# Patient Record
Sex: Female | Born: 1945 | Race: Black or African American | Hispanic: No | Marital: Single | Smoking: Never smoker
Health system: Southern US, Community
[De-identification: ages and names within clinical notes are randomized; demographics above are authoritative.]

## PROBLEM LIST (undated history)

## (undated) DIAGNOSIS — E785 Hyperlipidemia, unspecified: Secondary | ICD-10-CM

## (undated) DIAGNOSIS — I10 Essential (primary) hypertension: Secondary | ICD-10-CM

## (undated) DIAGNOSIS — K219 Gastro-esophageal reflux disease without esophagitis: Secondary | ICD-10-CM

## (undated) HISTORY — DX: Hyperlipidemia, unspecified: E78.5

## (undated) HISTORY — DX: Gastro-esophageal reflux disease without esophagitis: K21.9

## (undated) HISTORY — DX: Essential (primary) hypertension: I10

---

## 1998-06-01 ENCOUNTER — Other Ambulatory Visit: Admission: RE | Admit: 1998-06-01 | Discharge: 1998-06-01 | Payer: Self-pay | Admitting: Family Medicine

## 1999-04-27 ENCOUNTER — Emergency Department (HOSPITAL_COMMUNITY): Admission: EM | Admit: 1999-04-27 | Discharge: 1999-04-27 | Payer: Self-pay | Admitting: Internal Medicine

## 1999-10-25 ENCOUNTER — Emergency Department (HOSPITAL_COMMUNITY): Admission: EM | Admit: 1999-10-25 | Discharge: 1999-10-25 | Payer: Self-pay | Admitting: Emergency Medicine

## 1999-12-14 ENCOUNTER — Encounter: Payer: Self-pay | Admitting: Emergency Medicine

## 1999-12-14 ENCOUNTER — Emergency Department (HOSPITAL_COMMUNITY): Admission: EM | Admit: 1999-12-14 | Discharge: 1999-12-14 | Payer: Self-pay | Admitting: Emergency Medicine

## 2000-02-02 ENCOUNTER — Emergency Department (HOSPITAL_COMMUNITY): Admission: EM | Admit: 2000-02-02 | Discharge: 2000-02-02 | Payer: Self-pay | Admitting: Emergency Medicine

## 2000-06-11 ENCOUNTER — Emergency Department (HOSPITAL_COMMUNITY): Admission: EM | Admit: 2000-06-11 | Discharge: 2000-06-11 | Payer: Self-pay

## 2000-12-15 ENCOUNTER — Encounter: Payer: Self-pay | Admitting: Emergency Medicine

## 2000-12-15 ENCOUNTER — Emergency Department (HOSPITAL_COMMUNITY): Admission: EM | Admit: 2000-12-15 | Discharge: 2000-12-15 | Payer: Self-pay | Admitting: Emergency Medicine

## 2000-12-22 ENCOUNTER — Emergency Department (HOSPITAL_COMMUNITY): Admission: EM | Admit: 2000-12-22 | Discharge: 2000-12-22 | Payer: Self-pay | Admitting: Emergency Medicine

## 2000-12-22 ENCOUNTER — Encounter: Payer: Self-pay | Admitting: Emergency Medicine

## 2000-12-26 ENCOUNTER — Ambulatory Visit (HOSPITAL_COMMUNITY): Admission: RE | Admit: 2000-12-26 | Discharge: 2000-12-26 | Payer: Self-pay

## 2000-12-26 ENCOUNTER — Encounter: Admission: RE | Admit: 2000-12-26 | Discharge: 2000-12-26 | Payer: Self-pay

## 2000-12-31 ENCOUNTER — Encounter: Admission: RE | Admit: 2000-12-31 | Discharge: 2000-12-31 | Payer: Self-pay

## 2001-03-10 ENCOUNTER — Emergency Department (HOSPITAL_COMMUNITY): Admission: EM | Admit: 2001-03-10 | Discharge: 2001-03-10 | Payer: Self-pay | Admitting: Emergency Medicine

## 2001-04-08 ENCOUNTER — Encounter: Admission: RE | Admit: 2001-04-08 | Discharge: 2001-04-08 | Payer: Self-pay | Admitting: Internal Medicine

## 2001-05-24 ENCOUNTER — Emergency Department (HOSPITAL_COMMUNITY): Admission: EM | Admit: 2001-05-24 | Discharge: 2001-05-24 | Payer: Self-pay | Admitting: Emergency Medicine

## 2001-06-16 ENCOUNTER — Emergency Department (HOSPITAL_COMMUNITY): Admission: EM | Admit: 2001-06-16 | Discharge: 2001-06-16 | Payer: Self-pay | Admitting: Emergency Medicine

## 2002-05-01 ENCOUNTER — Emergency Department (HOSPITAL_COMMUNITY): Admission: EM | Admit: 2002-05-01 | Discharge: 2002-05-01 | Payer: Self-pay | Admitting: Emergency Medicine

## 2002-05-01 ENCOUNTER — Encounter: Payer: Self-pay | Admitting: Emergency Medicine

## 2002-05-15 ENCOUNTER — Ambulatory Visit (HOSPITAL_COMMUNITY): Admission: RE | Admit: 2002-05-15 | Discharge: 2002-05-15 | Payer: Self-pay | Admitting: *Deleted

## 2002-05-15 ENCOUNTER — Encounter: Payer: Self-pay | Admitting: *Deleted

## 2002-08-15 ENCOUNTER — Encounter: Admission: RE | Admit: 2002-08-15 | Discharge: 2002-08-15 | Payer: Self-pay | Admitting: Internal Medicine

## 2002-08-26 ENCOUNTER — Encounter: Admission: RE | Admit: 2002-08-26 | Discharge: 2002-08-26 | Payer: Self-pay | Admitting: *Deleted

## 2002-11-30 ENCOUNTER — Emergency Department (HOSPITAL_COMMUNITY): Admission: EM | Admit: 2002-11-30 | Discharge: 2002-11-30 | Payer: Self-pay | Admitting: Emergency Medicine

## 2002-12-03 ENCOUNTER — Encounter: Admission: RE | Admit: 2002-12-03 | Discharge: 2002-12-03 | Payer: Self-pay | Admitting: Internal Medicine

## 2002-12-10 ENCOUNTER — Encounter: Admission: RE | Admit: 2002-12-10 | Discharge: 2002-12-10 | Payer: Self-pay | Admitting: Internal Medicine

## 2002-12-22 ENCOUNTER — Encounter: Admission: RE | Admit: 2002-12-22 | Discharge: 2002-12-22 | Payer: Self-pay | Admitting: Internal Medicine

## 2002-12-23 ENCOUNTER — Observation Stay (HOSPITAL_COMMUNITY): Admission: EM | Admit: 2002-12-23 | Discharge: 2002-12-24 | Payer: Self-pay | Admitting: Emergency Medicine

## 2002-12-23 ENCOUNTER — Encounter: Payer: Self-pay | Admitting: Internal Medicine

## 2002-12-28 ENCOUNTER — Emergency Department (HOSPITAL_COMMUNITY): Admission: EM | Admit: 2002-12-28 | Discharge: 2002-12-28 | Payer: Self-pay | Admitting: Emergency Medicine

## 2003-01-02 ENCOUNTER — Encounter: Admission: RE | Admit: 2003-01-02 | Discharge: 2003-01-02 | Payer: Self-pay | Admitting: Internal Medicine

## 2003-01-05 ENCOUNTER — Encounter: Admission: RE | Admit: 2003-01-05 | Discharge: 2003-01-05 | Payer: Self-pay | Admitting: Internal Medicine

## 2003-01-09 ENCOUNTER — Encounter: Payer: Self-pay | Admitting: Cardiology

## 2003-01-09 ENCOUNTER — Ambulatory Visit (HOSPITAL_COMMUNITY): Admission: RE | Admit: 2003-01-09 | Discharge: 2003-01-09 | Payer: Self-pay | Admitting: Internal Medicine

## 2003-01-16 ENCOUNTER — Emergency Department (HOSPITAL_COMMUNITY): Admission: EM | Admit: 2003-01-16 | Discharge: 2003-01-17 | Payer: Self-pay | Admitting: Emergency Medicine

## 2003-01-16 ENCOUNTER — Encounter: Payer: Self-pay | Admitting: Emergency Medicine

## 2003-01-19 ENCOUNTER — Encounter: Admission: RE | Admit: 2003-01-19 | Discharge: 2003-01-19 | Payer: Self-pay | Admitting: Internal Medicine

## 2003-01-19 ENCOUNTER — Emergency Department (HOSPITAL_COMMUNITY): Admission: EM | Admit: 2003-01-19 | Discharge: 2003-01-20 | Payer: Self-pay | Admitting: Emergency Medicine

## 2003-01-23 ENCOUNTER — Emergency Department (HOSPITAL_COMMUNITY): Admission: EM | Admit: 2003-01-23 | Discharge: 2003-01-24 | Payer: Self-pay | Admitting: Emergency Medicine

## 2003-01-28 ENCOUNTER — Encounter: Admission: RE | Admit: 2003-01-28 | Discharge: 2003-01-28 | Payer: Self-pay | Admitting: Internal Medicine

## 2003-02-16 ENCOUNTER — Encounter: Admission: RE | Admit: 2003-02-16 | Discharge: 2003-02-16 | Payer: Self-pay | Admitting: Internal Medicine

## 2003-02-18 ENCOUNTER — Encounter: Admission: RE | Admit: 2003-02-18 | Discharge: 2003-05-19 | Payer: Self-pay | Admitting: *Deleted

## 2003-03-16 ENCOUNTER — Encounter: Payer: Self-pay | Admitting: Emergency Medicine

## 2003-03-16 ENCOUNTER — Emergency Department (HOSPITAL_COMMUNITY): Admission: AD | Admit: 2003-03-16 | Discharge: 2003-03-16 | Payer: Self-pay | Admitting: Emergency Medicine

## 2003-03-23 ENCOUNTER — Encounter: Admission: RE | Admit: 2003-03-23 | Discharge: 2003-03-23 | Payer: Self-pay | Admitting: Internal Medicine

## 2003-05-21 ENCOUNTER — Encounter: Admission: RE | Admit: 2003-05-21 | Discharge: 2003-05-21 | Payer: Self-pay | Admitting: Internal Medicine

## 2003-05-29 ENCOUNTER — Emergency Department (HOSPITAL_COMMUNITY): Admission: EM | Admit: 2003-05-29 | Discharge: 2003-05-30 | Payer: Self-pay | Admitting: Emergency Medicine

## 2003-05-29 ENCOUNTER — Encounter: Payer: Self-pay | Admitting: Emergency Medicine

## 2003-06-23 ENCOUNTER — Encounter: Admission: RE | Admit: 2003-06-23 | Discharge: 2003-06-23 | Payer: Self-pay | Admitting: Internal Medicine

## 2003-08-20 ENCOUNTER — Emergency Department (HOSPITAL_COMMUNITY): Admission: EM | Admit: 2003-08-20 | Discharge: 2003-08-20 | Payer: Self-pay

## 2003-10-06 ENCOUNTER — Encounter: Admission: RE | Admit: 2003-10-06 | Discharge: 2003-10-06 | Payer: Self-pay | Admitting: Internal Medicine

## 2003-10-06 ENCOUNTER — Ambulatory Visit (HOSPITAL_COMMUNITY): Admission: RE | Admit: 2003-10-06 | Discharge: 2003-10-06 | Payer: Self-pay | Admitting: Internal Medicine

## 2004-01-01 ENCOUNTER — Emergency Department (HOSPITAL_COMMUNITY): Admission: EM | Admit: 2004-01-01 | Discharge: 2004-01-01 | Payer: Self-pay | Admitting: Family Medicine

## 2004-02-23 ENCOUNTER — Encounter: Admission: RE | Admit: 2004-02-23 | Discharge: 2004-02-23 | Payer: Self-pay | Admitting: Internal Medicine

## 2004-04-04 ENCOUNTER — Encounter: Admission: RE | Admit: 2004-04-04 | Discharge: 2004-04-04 | Payer: Self-pay | Admitting: Internal Medicine

## 2004-05-29 ENCOUNTER — Emergency Department (HOSPITAL_COMMUNITY): Admission: EM | Admit: 2004-05-29 | Discharge: 2004-05-29 | Payer: Self-pay | Admitting: Family Medicine

## 2004-05-31 ENCOUNTER — Ambulatory Visit (HOSPITAL_COMMUNITY): Admission: RE | Admit: 2004-05-31 | Discharge: 2004-05-31 | Payer: Self-pay | Admitting: Gastroenterology

## 2004-05-31 ENCOUNTER — Encounter (INDEPENDENT_AMBULATORY_CARE_PROVIDER_SITE_OTHER): Payer: Self-pay | Admitting: *Deleted

## 2004-07-24 ENCOUNTER — Emergency Department (HOSPITAL_COMMUNITY): Admission: EM | Admit: 2004-07-24 | Discharge: 2004-07-25 | Payer: Self-pay | Admitting: Emergency Medicine

## 2004-07-24 ENCOUNTER — Emergency Department (HOSPITAL_COMMUNITY): Admission: EM | Admit: 2004-07-24 | Discharge: 2004-07-24 | Payer: Self-pay | Admitting: Emergency Medicine

## 2004-08-04 ENCOUNTER — Ambulatory Visit: Payer: Self-pay | Admitting: Internal Medicine

## 2004-08-17 ENCOUNTER — Ambulatory Visit: Payer: Self-pay | Admitting: Internal Medicine

## 2004-08-31 ENCOUNTER — Ambulatory Visit: Payer: Self-pay | Admitting: Internal Medicine

## 2004-09-01 ENCOUNTER — Ambulatory Visit: Payer: Self-pay | Admitting: Internal Medicine

## 2004-12-07 ENCOUNTER — Emergency Department (HOSPITAL_COMMUNITY): Admission: EM | Admit: 2004-12-07 | Discharge: 2004-12-07 | Payer: Self-pay | Admitting: Emergency Medicine

## 2005-04-12 ENCOUNTER — Ambulatory Visit: Payer: Self-pay | Admitting: Internal Medicine

## 2005-06-19 ENCOUNTER — Emergency Department (HOSPITAL_COMMUNITY): Admission: EM | Admit: 2005-06-19 | Discharge: 2005-06-20 | Payer: Self-pay | Admitting: Emergency Medicine

## 2005-11-07 ENCOUNTER — Emergency Department (HOSPITAL_COMMUNITY): Admission: EM | Admit: 2005-11-07 | Discharge: 2005-11-07 | Payer: Self-pay | Admitting: Family Medicine

## 2006-03-15 ENCOUNTER — Ambulatory Visit: Payer: Self-pay | Admitting: Internal Medicine

## 2006-05-08 ENCOUNTER — Encounter (INDEPENDENT_AMBULATORY_CARE_PROVIDER_SITE_OTHER): Payer: Self-pay | Admitting: *Deleted

## 2006-05-08 ENCOUNTER — Ambulatory Visit (HOSPITAL_BASED_OUTPATIENT_CLINIC_OR_DEPARTMENT_OTHER): Admission: RE | Admit: 2006-05-08 | Discharge: 2006-05-08 | Payer: Self-pay | Admitting: Orthopedic Surgery

## 2006-07-19 ENCOUNTER — Emergency Department (HOSPITAL_COMMUNITY): Admission: EM | Admit: 2006-07-19 | Discharge: 2006-07-19 | Payer: Self-pay | Admitting: Emergency Medicine

## 2006-08-14 ENCOUNTER — Encounter (INDEPENDENT_AMBULATORY_CARE_PROVIDER_SITE_OTHER): Payer: Self-pay | Admitting: Pulmonary Disease

## 2006-08-14 ENCOUNTER — Ambulatory Visit: Payer: Self-pay | Admitting: Internal Medicine

## 2006-08-14 LAB — CONVERTED CEMR LAB: Pap Smear: NORMAL

## 2006-08-15 ENCOUNTER — Encounter (INDEPENDENT_AMBULATORY_CARE_PROVIDER_SITE_OTHER): Payer: Self-pay | Admitting: Pulmonary Disease

## 2006-08-15 DIAGNOSIS — I209 Angina pectoris, unspecified: Secondary | ICD-10-CM | POA: Insufficient documentation

## 2006-08-15 DIAGNOSIS — K219 Gastro-esophageal reflux disease without esophagitis: Secondary | ICD-10-CM | POA: Insufficient documentation

## 2006-08-15 DIAGNOSIS — K649 Unspecified hemorrhoids: Secondary | ICD-10-CM | POA: Insufficient documentation

## 2006-08-27 ENCOUNTER — Emergency Department (HOSPITAL_COMMUNITY): Admission: EM | Admit: 2006-08-27 | Discharge: 2006-08-27 | Payer: Self-pay | Admitting: Family Medicine

## 2006-09-11 ENCOUNTER — Ambulatory Visit: Payer: Self-pay | Admitting: Internal Medicine

## 2006-09-12 ENCOUNTER — Ambulatory Visit: Payer: Self-pay | Admitting: Internal Medicine

## 2006-09-12 ENCOUNTER — Encounter (INDEPENDENT_AMBULATORY_CARE_PROVIDER_SITE_OTHER): Payer: Self-pay | Admitting: Pulmonary Disease

## 2006-09-12 LAB — CONVERTED CEMR LAB
Bilirubin Urine: NEGATIVE
HCT: 36.5 % (ref 36.0–46.0)
Hemoglobin: 12.5 g/dL (ref 12.0–15.0)
Hgb urine dipstick: NEGATIVE
Ketones, ur: NEGATIVE mg/dL
Leukocyte count, blood: 5.5 10*9/L (ref 4.0–10.5)
MCHC: 34.1 g/dL (ref 30.0–36.0)
MCV: 94.4 fL (ref 78.0–100.0)
Nitrite: NEGATIVE
Platelets: 220 10*3/uL (ref 150–400)
Protein, ur: NEGATIVE mg/dL
RBC: 3.87 M/uL (ref 3.87–5.11)
RDW: 13.3 % (ref 11.5–14.0)
Specific Gravity, Urine: 1.027 (ref 1.005–1.03)
Urine Glucose: NEGATIVE mg/dL
Urobilinogen, UA: 0.2 (ref 0.0–1.0)
pH: 6 (ref 5.0–8.0)

## 2006-09-18 ENCOUNTER — Ambulatory Visit: Payer: Self-pay | Admitting: Internal Medicine

## 2006-10-02 ENCOUNTER — Encounter (INDEPENDENT_AMBULATORY_CARE_PROVIDER_SITE_OTHER): Payer: Self-pay | Admitting: Internal Medicine

## 2006-12-21 ENCOUNTER — Encounter (INDEPENDENT_AMBULATORY_CARE_PROVIDER_SITE_OTHER): Payer: Self-pay | Admitting: Pulmonary Disease

## 2007-01-03 DIAGNOSIS — A048 Other specified bacterial intestinal infections: Secondary | ICD-10-CM | POA: Insufficient documentation

## 2007-01-18 ENCOUNTER — Emergency Department (HOSPITAL_COMMUNITY): Admission: EM | Admit: 2007-01-18 | Discharge: 2007-01-18 | Payer: Self-pay | Admitting: Family Medicine

## 2007-06-28 ENCOUNTER — Emergency Department (HOSPITAL_COMMUNITY): Admission: EM | Admit: 2007-06-28 | Discharge: 2007-06-28 | Payer: Self-pay | Admitting: Family Medicine

## 2007-08-09 ENCOUNTER — Emergency Department (HOSPITAL_COMMUNITY): Admission: EM | Admit: 2007-08-09 | Discharge: 2007-08-09 | Payer: Self-pay | Admitting: Family Medicine

## 2007-08-12 ENCOUNTER — Emergency Department (HOSPITAL_COMMUNITY): Admission: EM | Admit: 2007-08-12 | Discharge: 2007-08-12 | Payer: Self-pay | Admitting: Emergency Medicine

## 2007-08-15 ENCOUNTER — Emergency Department (HOSPITAL_COMMUNITY): Admission: EM | Admit: 2007-08-15 | Discharge: 2007-08-15 | Payer: Self-pay | Admitting: Emergency Medicine

## 2007-09-01 ENCOUNTER — Inpatient Hospital Stay (HOSPITAL_COMMUNITY): Admission: EM | Admit: 2007-09-01 | Discharge: 2007-09-04 | Payer: Self-pay | Admitting: Emergency Medicine

## 2007-09-01 ENCOUNTER — Ambulatory Visit: Payer: Self-pay | Admitting: Internal Medicine

## 2007-09-03 ENCOUNTER — Encounter (INDEPENDENT_AMBULATORY_CARE_PROVIDER_SITE_OTHER): Payer: Self-pay | Admitting: Internal Medicine

## 2007-09-03 ENCOUNTER — Ambulatory Visit: Payer: Self-pay | Admitting: Surgery

## 2007-09-06 ENCOUNTER — Encounter (INDEPENDENT_AMBULATORY_CARE_PROVIDER_SITE_OTHER): Payer: Self-pay | Admitting: Internal Medicine

## 2007-09-06 ENCOUNTER — Ambulatory Visit: Payer: Self-pay | Admitting: Infectious Diseases

## 2007-09-06 DIAGNOSIS — E785 Hyperlipidemia, unspecified: Secondary | ICD-10-CM | POA: Insufficient documentation

## 2007-09-06 DIAGNOSIS — E1169 Type 2 diabetes mellitus with other specified complication: Secondary | ICD-10-CM | POA: Insufficient documentation

## 2007-09-06 DIAGNOSIS — I10 Essential (primary) hypertension: Secondary | ICD-10-CM | POA: Insufficient documentation

## 2007-09-06 LAB — CONVERTED CEMR LAB
ALT: 15 units/L (ref 0–35)
AST: 18 units/L (ref 0–37)
Albumin: 4.5 g/dL (ref 3.5–5.2)
Alkaline Phosphatase: 63 units/L (ref 39–117)
BUN: 22 mg/dL (ref 6–23)
CO2: 24 meq/L (ref 19–32)
Glucose, Bld: 175 mg/dL — ABNORMAL HIGH (ref 70–99)
Potassium: 3.6 meq/L (ref 3.5–5.3)
Sodium: 138 meq/L (ref 135–145)
Total Bilirubin: 0.7 mg/dL (ref 0.3–1.2)
Total Protein: 7.6 g/dL (ref 6.0–8.3)

## 2007-10-24 ENCOUNTER — Ambulatory Visit (HOSPITAL_COMMUNITY): Admission: RE | Admit: 2007-10-24 | Discharge: 2007-10-24 | Payer: Self-pay | Admitting: Internal Medicine

## 2007-10-24 ENCOUNTER — Encounter (INDEPENDENT_AMBULATORY_CARE_PROVIDER_SITE_OTHER): Payer: Self-pay | Admitting: Internal Medicine

## 2007-10-24 ENCOUNTER — Ambulatory Visit: Payer: Self-pay | Admitting: Infectious Diseases

## 2007-10-24 LAB — CONVERTED CEMR LAB
Hgb A1c MFr Bld: 7.4 %
Trichomonal Vaginitis: POSITIVE — AB

## 2007-10-25 LAB — CONVERTED CEMR LAB
Calcium: 9.9 mg/dL (ref 8.4–10.5)
Chlamydia, DNA Probe: NEGATIVE
Potassium: 3.8 meq/L (ref 3.5–5.3)
Protein, ur: NEGATIVE mg/dL
Specific Gravity, Urine: 1.023 (ref 1.005–1.03)
Urine Glucose: NEGATIVE mg/dL
pH: 6 (ref 5.0–8.0)

## 2007-10-30 ENCOUNTER — Ambulatory Visit (HOSPITAL_COMMUNITY): Admission: RE | Admit: 2007-10-30 | Discharge: 2007-10-30 | Payer: Self-pay | Admitting: Internal Medicine

## 2007-11-13 ENCOUNTER — Ambulatory Visit: Payer: Self-pay | Admitting: Hospitalist

## 2007-11-18 ENCOUNTER — Telehealth: Payer: Self-pay | Admitting: *Deleted

## 2007-11-19 DIAGNOSIS — A5901 Trichomonal vulvovaginitis: Secondary | ICD-10-CM | POA: Insufficient documentation

## 2007-11-20 ENCOUNTER — Encounter (INDEPENDENT_AMBULATORY_CARE_PROVIDER_SITE_OTHER): Payer: Self-pay | Admitting: Internal Medicine

## 2007-11-28 ENCOUNTER — Ambulatory Visit: Payer: Self-pay | Admitting: Internal Medicine

## 2008-01-01 ENCOUNTER — Encounter (INDEPENDENT_AMBULATORY_CARE_PROVIDER_SITE_OTHER): Payer: Self-pay | Admitting: Internal Medicine

## 2008-01-01 ENCOUNTER — Ambulatory Visit: Payer: Self-pay | Admitting: Obstetrics & Gynecology

## 2008-01-01 ENCOUNTER — Other Ambulatory Visit: Admission: RE | Admit: 2008-01-01 | Discharge: 2008-01-01 | Payer: Self-pay | Admitting: Obstetrics & Gynecology

## 2008-01-15 ENCOUNTER — Ambulatory Visit: Payer: Self-pay | Admitting: Obstetrics & Gynecology

## 2008-02-05 ENCOUNTER — Ambulatory Visit: Payer: Self-pay | Admitting: Obstetrics & Gynecology

## 2008-02-25 ENCOUNTER — Ambulatory Visit: Payer: Self-pay | Admitting: Infectious Disease

## 2008-02-25 LAB — CONVERTED CEMR LAB: Blood Glucose, Fingerstick: 267

## 2008-02-26 ENCOUNTER — Encounter (INDEPENDENT_AMBULATORY_CARE_PROVIDER_SITE_OTHER): Payer: Self-pay | Admitting: Internal Medicine

## 2008-02-26 LAB — CONVERTED CEMR LAB
Gardnerella vaginalis: NEGATIVE
Trichomonal Vaginitis: NEGATIVE

## 2008-03-09 ENCOUNTER — Ambulatory Visit: Payer: Self-pay | Admitting: Obstetrics & Gynecology

## 2008-03-09 ENCOUNTER — Ambulatory Visit (HOSPITAL_COMMUNITY): Admission: RE | Admit: 2008-03-09 | Discharge: 2008-03-09 | Payer: Self-pay | Admitting: Obstetrics & Gynecology

## 2008-03-09 ENCOUNTER — Encounter: Payer: Self-pay | Admitting: Obstetrics & Gynecology

## 2008-03-16 ENCOUNTER — Encounter (INDEPENDENT_AMBULATORY_CARE_PROVIDER_SITE_OTHER): Payer: Self-pay | Admitting: Family Medicine

## 2008-03-18 ENCOUNTER — Ambulatory Visit: Payer: Self-pay | Admitting: Infectious Disease

## 2008-03-18 LAB — CONVERTED CEMR LAB

## 2008-05-11 ENCOUNTER — Ambulatory Visit: Payer: Self-pay | Admitting: Internal Medicine

## 2008-05-21 ENCOUNTER — Emergency Department (HOSPITAL_COMMUNITY): Admission: EM | Admit: 2008-05-21 | Discharge: 2008-05-21 | Payer: Self-pay | Admitting: Emergency Medicine

## 2008-06-08 ENCOUNTER — Encounter (INDEPENDENT_AMBULATORY_CARE_PROVIDER_SITE_OTHER): Payer: Self-pay | Admitting: Internal Medicine

## 2008-06-08 ENCOUNTER — Ambulatory Visit: Payer: Self-pay | Admitting: Internal Medicine

## 2008-06-08 ENCOUNTER — Telehealth: Payer: Self-pay | Admitting: *Deleted

## 2008-06-08 LAB — CONVERTED CEMR LAB
ALT: 20 units/L (ref 0–35)
Albumin: 4.5 g/dL (ref 3.5–5.2)
BUN: 19 mg/dL (ref 6–23)
Blood Glucose, Fingerstick: 145
Candida species: NEGATIVE
Chloride: 101 meq/L (ref 96–112)
Creatinine, Ser: 1.17 mg/dL (ref 0.40–1.20)
Gardnerella vaginalis: NEGATIVE
Hemoglobin, Urine: NEGATIVE
Hgb A1c MFr Bld: 7.2 %
Leukocytes, UA: NEGATIVE
Protein, ur: NEGATIVE mg/dL
Specific Gravity, Urine: 1.024 (ref 1.005–1.03)
Total Protein: 7.9 g/dL (ref 6.0–8.3)
Trichomonal Vaginitis: NEGATIVE
Urine Glucose: NEGATIVE mg/dL
pH: 5.5 (ref 5.0–8.0)

## 2008-06-24 ENCOUNTER — Ambulatory Visit: Payer: Self-pay | Admitting: Internal Medicine

## 2008-06-24 ENCOUNTER — Encounter (INDEPENDENT_AMBULATORY_CARE_PROVIDER_SITE_OTHER): Payer: Self-pay | Admitting: Internal Medicine

## 2008-06-24 LAB — CONVERTED CEMR LAB
AST: 22 units/L (ref 0–37)
Bilirubin Urine: NEGATIVE
CO2: 27 meq/L (ref 19–32)
Chloride: 100 meq/L (ref 96–112)
Creatinine, Urine: 116.2 mg/dL
Glucose, Bld: 122 mg/dL — ABNORMAL HIGH (ref 70–99)
Hemoglobin, Urine: NEGATIVE
Ketones, ur: NEGATIVE mg/dL
Leukocytes, UA: NEGATIVE
Microalb Creat Ratio: 3.3 mg/g (ref 0.0–30.0)
Microalb, Ur: 0.38 mg/dL (ref 0.00–1.89)
Nitrite: NEGATIVE
Potassium: 3.7 meq/L (ref 3.5–5.3)
Protein, ur: NEGATIVE mg/dL
Sodium: 142 meq/L (ref 135–145)
Specific Gravity, Urine: 1.02 (ref 1.005–1.03)
Urine Glucose: NEGATIVE mg/dL
pH: 6 (ref 5.0–8.0)

## 2008-08-06 ENCOUNTER — Encounter (INDEPENDENT_AMBULATORY_CARE_PROVIDER_SITE_OTHER): Payer: Self-pay | Admitting: Internal Medicine

## 2008-08-06 ENCOUNTER — Ambulatory Visit: Payer: Self-pay | Admitting: Internal Medicine

## 2008-08-06 LAB — CONVERTED CEMR LAB
Basophils Absolute: 0 10*3/uL (ref 0.0–0.1)
Basophils Relative: 0 % (ref 0–1)
Blood Glucose, Fingerstick: 127
Eosinophils Absolute: 0.1 10*3/uL (ref 0.0–0.7)
Eosinophils Relative: 2 % (ref 0–5)
Hemoglobin: 12.5 g/dL (ref 12.0–15.0)
MCV: 95.2 fL (ref 78.0–100.0)
RBC: 4.14 M/uL (ref 3.87–5.11)
TSH: 0.795 microintl units/mL (ref 0.350–4.50)
WBC: 4.8 10*3/uL (ref 4.0–10.5)

## 2008-08-06 LAB — CBC AND DIFFERENTIAL: WBC: 4.8 10^3/mL

## 2008-08-10 ENCOUNTER — Telehealth (INDEPENDENT_AMBULATORY_CARE_PROVIDER_SITE_OTHER): Payer: Self-pay | Admitting: Internal Medicine

## 2008-09-01 ENCOUNTER — Encounter (INDEPENDENT_AMBULATORY_CARE_PROVIDER_SITE_OTHER): Payer: Self-pay | Admitting: Internal Medicine

## 2008-09-08 ENCOUNTER — Telehealth (INDEPENDENT_AMBULATORY_CARE_PROVIDER_SITE_OTHER): Payer: Self-pay | Admitting: Internal Medicine

## 2008-09-28 ENCOUNTER — Telehealth (INDEPENDENT_AMBULATORY_CARE_PROVIDER_SITE_OTHER): Payer: Self-pay | Admitting: Internal Medicine

## 2008-10-06 ENCOUNTER — Encounter (INDEPENDENT_AMBULATORY_CARE_PROVIDER_SITE_OTHER): Payer: Self-pay | Admitting: Internal Medicine

## 2008-10-06 ENCOUNTER — Ambulatory Visit: Payer: Self-pay | Admitting: Internal Medicine

## 2008-10-12 LAB — CONVERTED CEMR LAB
Total CHOL/HDL Ratio: 2.5
VLDL: 8 mg/dL (ref 0–40)

## 2008-11-16 ENCOUNTER — Telehealth (INDEPENDENT_AMBULATORY_CARE_PROVIDER_SITE_OTHER): Payer: Self-pay | Admitting: Internal Medicine

## 2008-11-30 ENCOUNTER — Telehealth (INDEPENDENT_AMBULATORY_CARE_PROVIDER_SITE_OTHER): Payer: Self-pay | Admitting: Internal Medicine

## 2009-02-02 ENCOUNTER — Ambulatory Visit: Payer: Self-pay | Admitting: Internal Medicine
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
## 2009-04-01 ENCOUNTER — Encounter (INDEPENDENT_AMBULATORY_CARE_PROVIDER_SITE_OTHER): Payer: Self-pay | Admitting: Internal Medicine

## 2009-04-01 ENCOUNTER — Ambulatory Visit: Payer: Self-pay | Admitting: Internal Medicine

## 2009-04-02 LAB — CONVERTED CEMR LAB
Bilirubin Urine: NEGATIVE
Cholesterol: 174 mg/dL (ref 0–200)
HDL: 67 mg/dL (ref >39)
Hemoglobin, Urine: NEGATIVE
Ketones, ur: NEGATIVE mg/dL
LDL Cholesterol: 95 mg/dL (ref 0–99)
Protein, ur: NEGATIVE mg/dL
Triglycerides: 58 mg/dL (ref <150)
Urine Glucose: NEGATIVE mg/dL
Urobilinogen, UA: 1 (ref 0.0–1.0)
pH: 6.5 (ref 5.0–8.0)

## 2009-05-05 ENCOUNTER — Telehealth (INDEPENDENT_AMBULATORY_CARE_PROVIDER_SITE_OTHER): Payer: Self-pay | Admitting: Internal Medicine

## 2009-05-06 ENCOUNTER — Ambulatory Visit: Payer: Self-pay | Admitting: Internal Medicine

## 2009-05-06 LAB — CONVERTED CEMR LAB: Blood Glucose, Fingerstick: 147

## 2009-05-07 ENCOUNTER — Encounter (INDEPENDENT_AMBULATORY_CARE_PROVIDER_SITE_OTHER): Payer: Self-pay | Admitting: Internal Medicine

## 2009-05-28 ENCOUNTER — Ambulatory Visit: Payer: Self-pay | Admitting: Infectious Diseases

## 2009-05-28 ENCOUNTER — Encounter (INDEPENDENT_AMBULATORY_CARE_PROVIDER_SITE_OTHER): Payer: Self-pay | Admitting: Internal Medicine

## 2009-05-28 LAB — CONVERTED CEMR LAB
Candida species: NEGATIVE
Chlamydia, DNA Probe: NEGATIVE
Creatinine, Urine: 241.8 mg/dL
GC Probe Amp, Genital: NEGATIVE
Hemoglobin, Urine: NEGATIVE
Ketones, ur: NEGATIVE mg/dL
Microalb, Ur: 1.17 mg/dL (ref 0.00–1.89)
Protein, ur: NEGATIVE mg/dL
RBC / HPF: NONE SEEN (ref <3)
pH: 5.5 (ref 5.0–8.0)

## 2009-06-16 ENCOUNTER — Emergency Department (HOSPITAL_COMMUNITY): Admission: EM | Admit: 2009-06-16 | Discharge: 2009-06-16 | Payer: Self-pay | Admitting: Family Medicine

## 2009-09-02 ENCOUNTER — Encounter: Payer: Self-pay | Admitting: Internal Medicine

## 2009-09-09 ENCOUNTER — Ambulatory Visit: Payer: Self-pay | Admitting: Internal Medicine

## 2009-09-09 DIAGNOSIS — D179 Benign lipomatous neoplasm, unspecified: Secondary | ICD-10-CM | POA: Insufficient documentation

## 2009-09-10 ENCOUNTER — Ambulatory Visit: Payer: Self-pay | Admitting: Internal Medicine

## 2009-09-11 ENCOUNTER — Encounter (INDEPENDENT_AMBULATORY_CARE_PROVIDER_SITE_OTHER): Payer: Self-pay | Admitting: Internal Medicine

## 2009-09-14 DIAGNOSIS — R946 Abnormal results of thyroid function studies: Secondary | ICD-10-CM | POA: Insufficient documentation

## 2009-09-14 LAB — CONVERTED CEMR LAB
ALT: 13 units/L (ref 0–35)
AST: 19 units/L (ref 0–37)
BUN: 19 mg/dL (ref 6–23)
CO2: 28 meq/L (ref 19–32)
Cholesterol: 169 mg/dL (ref 0–200)
Creatinine, Ser: 1.01 mg/dL (ref 0.40–1.20)
Glucose, Bld: 99 mg/dL (ref 70–99)
LDL Cholesterol: 93 mg/dL (ref 0–99)
Potassium: 3.6 meq/L (ref 3.5–5.3)
Total Bilirubin: 0.9 mg/dL (ref 0.3–1.2)
VLDL: 11 mg/dL (ref 0–40)

## 2009-09-20 ENCOUNTER — Encounter (INDEPENDENT_AMBULATORY_CARE_PROVIDER_SITE_OTHER): Payer: Self-pay | Admitting: Internal Medicine

## 2009-09-20 ENCOUNTER — Ambulatory Visit: Payer: Self-pay | Admitting: Internal Medicine

## 2009-10-04 ENCOUNTER — Telehealth (INDEPENDENT_AMBULATORY_CARE_PROVIDER_SITE_OTHER): Payer: Self-pay | Admitting: *Deleted

## 2009-11-02 ENCOUNTER — Telehealth (INDEPENDENT_AMBULATORY_CARE_PROVIDER_SITE_OTHER): Payer: Self-pay | Admitting: *Deleted

## 2010-01-05 ENCOUNTER — Encounter: Payer: Self-pay | Admitting: Internal Medicine

## 2010-02-28 ENCOUNTER — Encounter: Payer: Self-pay | Admitting: Internal Medicine

## 2010-03-08 ENCOUNTER — Ambulatory Visit: Payer: Self-pay | Admitting: Internal Medicine

## 2010-03-08 ENCOUNTER — Other Ambulatory Visit: Payer: Self-pay | Admitting: Dietician

## 2010-03-08 ENCOUNTER — Telehealth (INDEPENDENT_AMBULATORY_CARE_PROVIDER_SITE_OTHER): Payer: Self-pay | Admitting: *Deleted

## 2010-03-08 LAB — CONVERTED CEMR LAB
ALT: 18 units/L (ref 0–35)
Alkaline Phosphatase: 76 units/L (ref 39–117)
Blood Glucose, Fingerstick: 149
Creatinine, Ser: 1.2 mg/dL (ref 0.40–1.20)
Glucose, Bld: 172 mg/dL — ABNORMAL HIGH (ref 70–99)
LDL Cholesterol: 111 mg/dL — ABNORMAL HIGH (ref 0–99)
Microalb Creat Ratio: 3.1 mg/g (ref 0.0–30.0)
Microalb, Ur: 0.6 mg/dL (ref 0.00–1.89)
Sodium: 140 meq/L (ref 135–145)
Total Bilirubin: 0.9 mg/dL (ref 0.3–1.2)
Total CHOL/HDL Ratio: 2.5
Total Protein: 8.2 g/dL (ref 6.0–8.3)
Triglycerides: 50 mg/dL (ref <150)
VLDL: 10 mg/dL (ref 0–40)

## 2010-03-08 LAB — TSH: TSH: 1.19 u[IU]/mL (ref <=5.90)

## 2010-03-08 LAB — BASIC METABOLIC PANEL
BUN: 21 mg/dL (ref 4–21)
Sodium: 140 mmol/L (ref 137–147)

## 2010-03-31 ENCOUNTER — Emergency Department (HOSPITAL_COMMUNITY): Admission: EM | Admit: 2010-03-31 | Discharge: 2010-03-31 | Payer: Self-pay | Admitting: Family Medicine

## 2010-04-03 ENCOUNTER — Emergency Department (HOSPITAL_COMMUNITY): Admission: EM | Admit: 2010-04-03 | Discharge: 2010-04-03 | Payer: Self-pay | Admitting: Emergency Medicine

## 2010-06-14 ENCOUNTER — Ambulatory Visit: Payer: Self-pay | Admitting: Internal Medicine

## 2010-06-14 LAB — CONVERTED CEMR LAB
Blood Glucose, Fingerstick: 133
Gardnerella vaginalis: NEGATIVE

## 2010-06-14 LAB — HM PAP SMEAR: HM Pap smear: NEGATIVE

## 2010-09-05 ENCOUNTER — Encounter: Payer: Self-pay | Admitting: Internal Medicine

## 2010-09-05 LAB — HM MAMMOGRAPHY

## 2010-09-08 ENCOUNTER — Ambulatory Visit: Payer: Self-pay | Admitting: Internal Medicine

## 2010-09-08 LAB — CONVERTED CEMR LAB: Blood Glucose, Fingerstick: 148

## 2010-09-08 LAB — HM DIABETES FOOT EXAM

## 2010-12-09 ENCOUNTER — Telehealth (INDEPENDENT_AMBULATORY_CARE_PROVIDER_SITE_OTHER): Payer: Self-pay | Admitting: *Deleted

## 2010-12-13 NOTE — Assessment & Plan Note (Signed)
Summary: FU VISIT/DS   Vital Signs:  Patient profile:   65 year old female Height:      65 inches (165.10 cm) Weight:      170.4 pounds (77.45 kg) BMI:     28.46 Temp:     99.1 degrees F (37.28 degrees C) oral Pulse rate:   67 / minute BP sitting:   115 / 74  (right arm)  Vitals Entered By: Stanton Kidney Ditzler RN (June 14, 2010 2:49 PM) Is Patient Diabetic? Yes Did you bring your meter with you today? No Pain Assessment Patient in pain? no      Nutritional Status BMI of 25 - 29 = overweight Nutritional Status Detail appetite good CBG Result 133  Have you ever been in a relationship where you felt threatened, hurt or afraid?denies   Does patient need assistance? Functional Status Self care Ambulation Normal Comments Pap smear   Primary Care Provider:  Chauncey Reading DO   History of Present Illness: 65 yo female with PMH outlined below presents to [facility] for regular follow up appointment. She has no concerns at the time. No recent sicknesses or hospitalizaitons. No episodes of chest pain, SOB, palpitations, no fever or chills. No specific abdominal or urinary concerns. No recent changes in appetite, weight, sleep patterns, mood. Needs PAP Smear today.   Depression History:      The patient denies a depressed mood most of the day and a diminished interest in her usual daily activities.  The patient denies significant weight loss, significant weight gain, insomnia, hypersomnia, psychomotor agitation, psychomotor retardation, fatigue (loss of energy), feelings of worthlessness (guilt), impaired concentration (indecisiveness), and recurrent thoughts of death or suicide.        The patient denies that she feels like life is not worth living, denies that she wishes that she were dead, and denies that she has thought about ending her life.         Preventive Screening-Counseling & Management  Alcohol-Tobacco     Alcohol drinks/day: <1     Smoking Status: never     Smoking  Cessation Counseling: yes  Caffeine-Diet-Exercise     Does Patient Exercise: yes     Type of exercise: WALKING     Times/week: 1-2  Problems Prior to Update: 1)  Thyroid Function Test, Abnormal  (ICD-794.5) 2)  Lipoma  (ICD-214.9) 3)  Tinea Pedis  (ICD-110.4) 4)  Dysuria  (ICD-788.1) 5)  Vaginitis  (ICD-616.10) 6)  Trichomonal Vaginitis  (ICD-131.01) 7)  Sexual Activity, High Risk  (ICD-V69.2) 8)  Menopause-related Vasomotor Symptoms, Hot Flashes  (ICD-627.2) 9)  Postmenopausal Bleeding  (ICD-627.1) 10)  H/F Syncope, Vasovagal  (ICD-780.2) 11)  Diabetes Mellitus, Type II  (ICD-250.00) 12)  Hyperlipidemia  (ICD-272.4) 13)  Hypertension  (ICD-401.9) 14)  Hyperlipidemia  (ICD-272.4) 15)  Helicobacter Pylori Gastritis  (ICD-041.86) 16)  Joint Stiffness, Hand  (ICD-719.54) 17)  Postmenopausal On Hormone Replacement Therapy  (ICD-V07.4) 18)  Diabetes Mellitus  (ICD-250.00) 19)  Prolapse, Vaginal Wall, 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 Take 1 Tablet By Mouth Once A Day 2)  Hydrochlorothiazide 25 Mg  Tabs (Hydrochlorothiazide) .... Take 1 Tablet By Mouth Once A Day 3)  Actos 15 Mg Tabs (Pioglitazone Hcl) .... Take 1 Tablet By  Mouth Once A Day 4)  Accu-Chek Compact Test Drum  Strp (Glucose Blood) .... Test Once Daily 5)  Accu-Chek Multiclix Lancets  Misc (Lancets) .... Used To Test Blood Sugar Once Daily 6)  Simvastatin 40 Mg Tabs (Simvastatin) .... Take 1/2 Tablet By Mouth Every Morning and 1 Tablet By Mouth At Bedtime For Cholesterol  Current Medications (verified): 1)  Aspirin 81 Mg  Tbec (Aspirin) .... Take 1 Tablet By Mouth Once A Day 2)  Hydrochlorothiazide 25 Mg  Tabs (Hydrochlorothiazide) .... Take 1 Tablet By Mouth Once A Day 3)  Actos 15 Mg  Tabs (Pioglitazone Hcl) .... Take 1 Tablet By Mouth Once A Day 4)  Accu-Chek Compact Test Drum  Strp (Glucose Blood) .... Test Once Daily 5)  Accu-Chek Multiclix Lancets  Misc (Lancets) .... Used To Test Blood Sugar Once Daily 6)  Simvastatin 40 Mg Tabs (Simvastatin) .... Take 1/2 Tablet By Mouth Every Morning and 1 Tablet By Mouth At Bedtime For Cholesterol  Allergies: 1)  ! Flagyl 2)  ! Metformin Hcl (Metformin Hcl)  Past History:  Past Medical History: Last updated: 08/06/2008 Diabetes mellitus, type II Hyperlipidemia Hypertension Syncope, negative workup 10/08 Trichomonas  Social History: Last updated: 02/02/2009 Single. Sexually active with one female partner. Never Smoked Alcohol use-no Drug use-no Regular exercise-yes  Risk Factors: Alcohol Use: <1 (06/14/2010) Exercise: yes (06/14/2010)  Risk Factors: Smoking Status: never (06/14/2010)  Social History: Reviewed history from 02/02/2009 and no changes required. Single. Sexually active with one female partner. Never Smoked Alcohol use-no Drug use-no Regular exercise-yes  Review of Systems       per HPI  Physical Exam  General:  Well-developed,well-nourished,in no acute distress; alert,appropriate and cooperative throughout examination Lungs:  Normal respiratory effort, chest expands symmetrically. Lungs are clear to auscultation, no crackles or wheezes. Heart:  Normal rate and regular rhythm. S1 and S2 normal without gallop, murmur, click, rub or other extra sounds. Abdomen:  Bowel sounds positive,abdomen soft and non-tender without masses, organomegaly or hernias noted. Genitalia:  external vaginal area is dry. there is thick milky white discharge coming from the cervical os which covers the cervix and upper vagina. no vaginal or cervical lesions.  uterus is normal in size. no adnexal tenderness. Psych:  Cognition and judgment appear intact. Alert and cooperative with normal attention span and concentration.  No apparent delusions, illusions, hallucinations  Diabetes Management Exam:    Foot Exam (with socks and/or shoes not present):       Sensory-Monofilament:          Left foot: normal          Right foot: normal   Impression & Recommendations:  Problem # 1:  DIABETES MELLITUS, TYPE II (ICD-250.00)  Will check A1C today to assess if adequate control. No changes in the regimen will be made today.  Her updated medication list for this problem includes:    Aspirin 81 Mg Tbec (Aspirin) .Marland Kitchen... Take 1 tablet by mouth once a day    Actos 15 Mg Tabs (Pioglitazone hcl) .Marland Kitchen... Take 1 tablet by mouth once a day  Labs Reviewed: Creat: 1.20 (03/08/2010)     Last Eye Exam: No diabetic retinopathy.  ou  (01/05/2010) Reviewed HgBA1c results: 6.7 (03/08/2010)  6.7 (09/10/2009)  Orders: T- Capillary Blood Glucose (82948) T-Hgb A1C (in-house) ([id number])  Problem # 2:  HYPERLIPIDEMIA (ICD-272.4) No significant changes in the FLP, will cont the same regimen for now and will reasses in few months.  Her updated medication list for  this problem includes:    Simvastatin 40 Mg Tabs (Simvastatin) .Marland Kitchen... Take 1/2 tablet by mouth every morning and 1 tablet by mouth at bedtime for cholesterol  Labs Reviewed: SGOT: 24 (03/08/2010)   SGPT: 18 (03/08/2010)   HDL:79 (03/08/2010), 65 (09/11/2009)  LDL:111 (03/08/2010), 93 (16/08/9603)  Chol:200 (03/08/2010), 169 (09/11/2009)  Trig:50 (03/08/2010), 53 (09/11/2009)  Problem # 3:  HYPERTENSION (ICD-401.9) At goal, will cont the same regimen.  Her updated medication list for this problem includes:    Hydrochlorothiazide 25 Mg Tabs (Hydrochlorothiazide) .Marland Kitchen... Take 1 tablet by mouth once a day  BP today: 115/74 Prior BP: 128/75 (03/08/2010)  Labs Reviewed: K+: 3.8 (03/08/2010) Creat: : 1.20 (03/08/2010)   Chol: 200 (03/08/2010)   HDL: 79 (03/08/2010)   LDL: 111 (03/08/2010)   TG: 50 (03/08/2010)  Problem # 4:  SEXUAL ACTIVITY, HIGH RISK (ICD-V69.2)  Will do  PAP smear today and will follow up on results.   Orders: T- GC Chlamydia ([id number]) T-Wet Prep by [facility] [phone]) T-Pap Smear, Thin Prep ([id number])  Complete Medication List: 1)  Aspirin 81 Mg Tbec (Aspirin) .... Take 1 tablet by mouth once a day 2)  Hydrochlorothiazide 25 Mg Tabs (Hydrochlorothiazide) .... Take 1 tablet by mouth once a day 3)  Actos 15 Mg Tabs (Pioglitazone hcl) .... Take 1 tablet by mouth once a day 4)  Accu-chek Compact Test Drum Strp (Glucose blood) .... Test once daily 5)  Accu-chek Multiclix Lancets Misc (Lancets) .... Used to test blood sugar once daily 6)  Simvastatin 40 Mg Tabs (Simvastatin) .... Take 1/2 tablet by mouth every morning and 1 tablet by mouth at bedtime for cholesterol  Patient Instructions: 1)  Please schedule a follow-up appointment in 3 months. 2)  Please check your blood pressure regularly, if it is >170 please call clinic at [phone] 3)  If you could be exposed to sexually transmitted diseases, you should use a condom. Process Orders Check Orders Results:     [facility]: ABN not required for this insurance Order queued for requisitioning for [facility]: June 14, 2010 3:26 PM  Tests Sent for requisitioning (June 14, 2010 3:26 PM):     06/14/2010: [facility] -- T- GC Chlamydia [[id number]] (signed)     06/14/2010: [facility] -- T-Wet Prep by [facility] [phone] (signed)     06/14/2010: [facility] -- T-Pap Smear, Thin Prep [[id number]] (signed)     Diabetic Foot Exam Last Podiatry Exam Date: 06/14/2010  Foot Inspection Is there a history of a foot ulcer?              No Is there a foot ulcer now?              No Is there swelling or an abnormal foot shape?          No Are the toenails long?                No Are the toenails thick?                No Are the toenails ingrown?              No Is there heavy callous build-up?              No Is there pain in  the calf muscle (Intermittent claudication) when walking?    NoIs there a claw toe deformity?  No Is there elevated skin temperature?            No Is there limited ankle dorsiflexion?            No Is there foot or ankle muscle weakness?            No  Diabetic Foot Care Education Patient educated on appropriate care of diabetic feet.   High Risk Feet? No   10-g (5.07) Semmes-Weinstein Monofilament Test           Right Foot          Left Foot Visual Inspection     normal           normal Test Control      normal         normal Site 1         normal         normal Site 2         normal         normal Site 3         normal         normal Site 4         normal         normal Site 5         normal         normal Site 6         normal         normal Site 7         normal         normal Site 8         normal         normal Site 9         normal         normal Site 10         normal         normal  Impression      normal         normal   Prevention & Chronic Care Immunizations   Influenza vaccine: Fluvax Non-MCR  (09/09/2009)   Influenza vaccine deferral: Not available  (05/06/2009)    Tetanus booster: Not documented   Td booster deferral: Not indicated  (03/08/2010)    Pneumococcal vaccine: Not documented   Pneumococcal vaccine deferral: Not indicated  (05/06/2009)    H. zoster vaccine: Not documented   H. zoster vaccine deferral: Not indicated  (03/08/2010)  Colorectal Screening   Hemoccult: Negative  (09/18/2006)   Hemoccult action/deferral: Not indicated  (03/08/2010)    Colonoscopy: Done  (10/02/2006)   Colonoscopy due: 10/02/2016  Other Screening   Pap smear:  Specimen Adequacy: Satisfactory for evaluation.   Interpretation/Result:Negative for intraepithelial Lesion or Malignancy.   Location: [facility].    (04/01/2009)   Pap smear due: 04/02/2011    Mammogram: No specific mammographic evidence of malignancy.  No significant changes  compared to previous study.  Assessment: BIRADS 1.   (09/02/2009)   Mammogram action/deferral: Screening mammogram in 1 year.     (09/01/2008)   Mammogram due: 09/2011    DXA bone density scan: Not documented   Smoking status: never  (06/14/2010)  Diabetes Mellitus   HgbA1C: 7.0  (06/14/2010)   Hemoglobin A1C due: 08/06/2009    Eye exam: No diabetic retinopathy.  ou   (01/05/2010)   Eye exam due: 01/2011    Foot exam: yes  (06/14/2010)  Foot exam action/deferral: Do today   High risk foot: No  (06/14/2010)   Foot care education: Done  (06/14/2010)    Urine microalbumin/creatinine ratio: 3.1  (03/08/2010)   Urine microalbumin action/deferral: Ordered   Urine microalbumin/cr due: 06/24/2009    Diabetes flowsheet reviewed?: Yes   Progress toward A1C goal: At goal  Lipids   Total Cholesterol: 200  (03/08/2010)   LDL: 111  (03/08/2010)   LDL Direct: Not documented   HDL: 79  (03/08/2010)   Triglycerides: 50  (03/08/2010)   Lipid panel due: 10/02/2009    SGOT (AST): 24  (03/08/2010)   SGPT (ALT): 18  (03/08/2010)   Alkaline phosphatase: 76  (03/08/2010)   Total bilirubin: 0.9  (03/08/2010)    Lipid flowsheet reviewed?: Yes   Progress toward LDL goal: Deteriorated  Hypertension   Last Blood Pressure: 115 / 74  (06/14/2010)   Serum creatinine: 1.20  (03/08/2010)   Serum potassium 3.8  (03/08/2010)    Hypertension flowsheet reviewed?: Yes   Progress toward BP goal: At goal  Self-Management Support :   Personal Goals (by the next clinic visit) :     Personal A1C goal: 7  (05/06/2009)     Personal blood pressure goal: 130/80  (05/06/2009)     Personal LDL goal: 100  (05/06/2009)    Patient will work on the following items until the next clinic visit to reach self-care goals:     Medications and monitoring: take my medicines every day, bring all of my medications to every visit, examine my feet every day  (06/14/2010)     Eating: drink diet soda or water instead  of juice or soda, eat more vegetables, use fresh or frozen vegetables, eat foods that are low in salt, eat baked foods instead of fried foods, eat fruit for snacks and desserts, limit or avoid alcohol  (06/14/2010)     Activity: take a 30 minute walk every day  (06/14/2010)    Diabetes self-management support: Written self-care plan, Education handout, Resources for patients handout  (06/14/2010)   Diabetes care plan printed   Diabetes education handout printed   Last diabetes self-management training by diabetes educator: 05/11/2008   Last medical nutrition therapy: 05/11/2008    Hypertension self-management support: Written self-care plan, Education handout, Resources for patients handout  (06/14/2010)   Hypertension self-care plan printed.   Hypertension education handout printed    Lipid self-management support: Written self-care plan, Education handout, Resources for patients handout  (06/14/2010)   Lipid self-care plan printed.   Lipid education handout printed      Resource handout printed.   Nursing Instructions: Diabetic foot exam today    Laboratory Results   Blood Tests   Date/Time Received: June 14, 2010 3:10 PM  Date/Time Reported: Burke Keels  June 14, 2010 3:10 PM   HGBA1C: 7.0%   (Normal Range: Non-Diabetic - 3-6%   Control Diabetic - 6-8%) CBG Random:: 133mg /dL

## 2010-12-13 NOTE — Consult Note (Signed)
Summary: [facility] Diabetic Eye Exam  [facility] Diabetic Eye Exam   Imported By: Florinda Marker 01/19/2010 14:37:11  _____________________________________________________________________  External Attachment:    Type:   Image     Comment:   External Document  Appended Document: [facility] Diabetic Eye Exam    Clinical Lists Changes  Observations: Added new observation of [id number]: 01/2011 (02/01/2010 15:49) Added new observation of DIAB EYE EX: No diabetic retinopathy.  ou  (01/05/2010 15:50)         Diabetic Eye Exam  Procedure date:  01/05/2010  Findings:      No diabetic retinopathy.  ou   Procedures Next Due Date:    Diabetic Eye Exam: 01/2011   Diabetic Eye Exam  Procedure date:  01/05/2010  Findings:      No diabetic retinopathy.  ou   Procedures Next Due Date:    Diabetic Eye Exam: 01/2011

## 2010-12-13 NOTE — Assessment & Plan Note (Signed)
Summary: FU VISIT/DS   Allergies: 1)  ! Flagyl 2)  ! Metformin Hcl (Metformin Hcl)   Complete Medication List: 1)  Aspirin 81 Mg Tbec (Aspirin) .... Take 1 tablet by mouth once a day 2)  Hydrochlorothiazide 25 Mg Tabs (Hydrochlorothiazide) .... Take 1 tablet by mouth once a day 3)  Actos 15 Mg Tabs (Pioglitazone hcl) .... Take 1 tablet by mouth once a day 4)  Accu-chek Compact Test Drum Strp (Glucose blood) .... Test once daily 5)  Accu-chek Multiclix Lancets Misc (Lancets) .... Used to test blood sugar once daily 6)  Simvastatin 40 Mg Tabs (Simvastatin) .... 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 Years of school completed: Some High school Special needs or Barriers:  difficulty readings  Coping with Diabetes Would you say you are: happy Tell me what you do when you are upset:prays  Diabetes Medications:  Lipid lowering Meds? Yes Anti-platelet Meds? Yes Comments: not using accuachek. does not like big pills.      Estimated /Usual Carb Intake Breakfast # of Carbs/Grams grits and eggs, oatmeal, 1/2 glass juice Midmorning # of Carbs/Grams banana Lunch # of Carbs/Grams peanutbutter & jelly sandwich, water, fruit bowl Dinner # of Carbs/Grams hamburger in oven, mustard on bun, water, canned corn or oodles of noodles  Nutrition assessment Weight change: Gain Amount of change: 13.8 # in 3 years ETOH : No What beverages do you drink?  water mostly Do you read food labels?                                                                          No Biggest challenge to eating healthy: Eating too much  Activity Limitations  Inadequate physical activity  Type of physical activity  Yardwork Diabetes Disease Process  Discussed  today  Medications State name-action-dose-duration-side effects-and time to take medication: Needs review/assistance   State appropriate timing of food related to medication: Not applicableDescribe safe needle/lancet disposal: Not applicable Nutritional Management Verbalize importance of controlling food portions: Demonstrates competencyState importance of spacing and not omitting meals and snacks: Demonstrates competencyState changes planned for home meals/snacks: Needs review/assistance Monitoring State purpose and frequency of monitoring BG-ketones-HgbA1C  : Needs review/assistance   Perform glucose monitoring/ketone testing and record results correctly: Not applicableState target blood glucose and HgbA1C goals: Needs review/assistance    Complications State the causes-signs and symptoms and prevention of Hyperglycemia: Needs review/assistance   Explain proper treatment of hyperglycemia: Needs review/assistance    Exercise Verbalizes safety measures for exercise related to diabetes: Demonstrates competency   Diabetes Management Education Done: 06/14/2010    BEHAVIORAL GOALS INITIAL Incorporating physical activity into lifestyle: purchase new sneaks and start walking        Diabetes Self-management support:  Patient's daughter, church memebers and clinic staff. follow up: by phone and also in 6 months

## 2010-12-13 NOTE — Letter (Signed)
Summary: [facility]  [facility]   Imported By: Margie Billet 03/25/2010 10:03:44  _____________________________________________________________________  External Attachment:    Type:   Image     Comment:   External Document

## 2010-12-13 NOTE — Assessment & Plan Note (Signed)
Summary: RA/NEEDS ROUTINE CHECKUP PER DONNA RILEY/[facility]   Vital Signs:  Patient profile:   65 year old female Height:      65 inches (165.10 cm) Weight:      172.7 pounds (78.50 kg) BMI:     28.84 Temp:     97.9 degrees F (36.61 degrees C) oral Pulse rate:   67 / minute BP sitting:   128 / 75  (right arm)  Vitals Entered By: Stanton Kidney Ditzler RN (March 08, 2010 10:08 AM) Is Patient Diabetic? Yes Did you bring your meter with you today? No Pain Assessment Patient in pain? no      Nutritional Status BMI of 25 - 29 = overweight Nutritional Status Detail appetite good CBG Result 149  Have you ever been in a relationship where you felt threatened, hurt or afraid?denies   Does patient need assistance? Functional Status Self care Ambulation Normal Comments Ck-up and ? knot right side forehead.   Primary Care Provider:  Chauncey Reading DO   History of Present Illness: 65 yo female with PMH outlined below presents to [facility] for regular follow up appointment. She has no concerns at the time. No recent sicknesses or hospitalizaitons. No episodes of chest pain, SOB, palpitations. No specific abdominal or urinary concerns. No recent changes in appetite, weight, sleep patterns, mood. She is up to date on her preventive medicine tests: mammogram, PAP smear, eye doctor, vaccinations.    Depression History:      The patient denies a depressed mood most of the day and a diminished interest in her usual daily activities.  The patient denies significant weight loss, significant weight gain, insomnia, hypersomnia, psychomotor agitation, psychomotor retardation, fatigue (loss of energy), feelings of worthlessness (guilt), impaired concentration (indecisiveness), and recurrent thoughts of death or suicide.        The patient denies that she feels like life is not worth living, denies that she wishes that she were dead, and denies that she has thought about ending her life.         Preventive  Screening-Counseling & Management  Alcohol-Tobacco     Alcohol drinks/day: <1     Smoking Status: never     Smoking Cessation Counseling: yes  Caffeine-Diet-Exercise     Does Patient Exercise: yes     Type of exercise: WALKING     Times/week: 1-2  Problems Prior to Update: 1)  Thyroid Function Test, Abnormal  (ICD-794.5) 2)  Lipoma  (ICD-214.9) 3)  Tinea Pedis  (ICD-110.4) 4)  Dysuria  (ICD-788.1) 5)  Vaginitis  (ICD-616.10) 6)  Trichomonal Vaginitis  (ICD-131.01) 7)  Sexual Activity, High Risk  (ICD-V69.2) 8)  Menopause-related Vasomotor Symptoms, Hot Flashes  (ICD-627.2) 9)  Postmenopausal Bleeding  (ICD-627.1) 10)  H/F Syncope, Vasovagal  (ICD-780.2) 11)  Diabetes Mellitus, Type II  (ICD-250.00) 12)  Hyperlipidemia  (ICD-272.4) 13)  Hypertension  (ICD-401.9) 14)  Hyperlipidemia  (ICD-272.4) 15)  Helicobacter Pylori Gastritis  (ICD-041.86) 16)  Joint Stiffness, Hand  (ICD-719.54) 17)  Postmenopausal On Hormone Replacement Therapy  (ICD-V07.4) 18)  Diabetes Mellitus  (ICD-250.00) 19)  Prolapse, Vaginal Wall, 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 Take 1 Tablet By Mouth Once A Day 2)  Hydrochlorothiazide 25 Mg  Tabs (Hydrochlorothiazide) .... Take 1 Tablet By  Mouth Once A Day 3)  Actos 15 Mg Tabs (Pioglitazone Hcl) .... Take 1 Tablet By Mouth Once A Day 4)  Accu-Chek Compact Test Drum  Strp (Glucose Blood) .... Test Once Daily 5)  Accu-Chek Multiclix Lancets  Misc (Lancets) .... Used To Test Blood Sugar Once Daily 6)  Simvastatin 40 Mg Tabs (Simvastatin) .... Take 1/2 Tablet By Mouth Every Morning and 1 Tablet By Mouth At Bedtime For Cholesterol  Current Medications (verified): 1)  Aspirin 81 Mg  Tbec (Aspirin) .... Take 1 Tablet By Mouth Once A  Day 2)  Hydrochlorothiazide 25 Mg  Tabs (Hydrochlorothiazide) .... Take 1 Tablet By Mouth Once A Day 3)  Actos 15 Mg Tabs (Pioglitazone Hcl) .... Take 1 Tablet By Mouth Once A Day 4)  Accu-Chek Compact Test Drum  Strp (Glucose Blood) .... Test Once Daily 5)  Accu-Chek Multiclix Lancets  Misc (Lancets) .... Used To Test Blood Sugar Once Daily 6)  Simvastatin 40 Mg Tabs (Simvastatin) .... Take 1/2 Tablet By Mouth Every Morning and 1 Tablet By Mouth At Bedtime For Cholesterol  Allergies (verified): 1)  ! Flagyl 2)  ! Metformin Hcl (Metformin Hcl)  Past History:  Past Medical History: Last updated: 08/06/2008 Diabetes mellitus, type II Hyperlipidemia Hypertension Syncope, negative workup 10/08 Trichomonas  Social History: Last updated: 02/02/2009 Single. Sexually active with one female partner. Never Smoked Alcohol use-no Drug use-no Regular exercise-yes  Risk Factors: Alcohol Use: <1 (03/08/2010) Exercise: yes (03/08/2010)  Risk Factors: Smoking Status: never (03/08/2010)  Social History: Reviewed history from 02/02/2009 and no changes required. Single. Sexually active with one female partner. Never Smoked Alcohol use-no Drug use-no Regular exercise-yes  Review of Systems       per HPI  Physical Exam  General:  Well-developed,well-nourished,in no acute distress; alert,appropriate and cooperative throughout examination Lungs:  Normal respiratory effort, chest expands symmetrically. Lungs are clear to auscultation, no crackles or wheezes. Heart:  Normal rate and regular rhythm. S1 and S2 normal without gallop, murmur, click, rub or other extra sounds. Abdomen:  Bowel sounds positive,abdomen soft and non-tender without masses, organomegaly or hernias noted. Neurologic:  No cranial nerve deficits noted. Station and gait are normal. Plantar reflexes are down-going bilaterally. DTRs are symmetrical throughout. Sensory, motor and coordinative functions appear intact. Psych:   Cognition and judgment appear intact. Alert and cooperative with normal attention span and concentration. No apparent delusions, illusions, hallucinations   Impression & Recommendations:  Problem # 1:  THYROID FUNCTION TEST, ABNORMAL (ICD-794.5) Will check TSH today and will follow up on results.  Orders: T-TSH ([id number])  Problem # 2:  DIABETES MELLITUS, TYPE II (ICD-250.00) No change in A1C, will cont the same regimen. Will follow up in 3 months.  Her updated medication list for this problem includes:    Aspirin 81 Mg Tbec (Aspirin) .Marland Kitchen... Take 1 tablet by mouth once a day    Actos 15 Mg Tabs (Pioglitazone hcl) .Marland Kitchen... Take 1 tablet by mouth once a day  Orders: T- Capillary Blood Glucose ([id number]) T-Hgb A1C (in-house) ([id number]) T-Urine Microalbumin w/creat. ratio [phone])  Labs Reviewed: Creat: 1.01 (09/11/2009)     Last Eye Exam: No diabetic retinopathy.  ou  (01/05/2010) Reviewed HgBA1c results: 6.7 (03/08/2010)  6.7 (09/10/2009)  Problem # 3:  HYPERLIPIDEMIA (ICD-272.4) Check FLP today and readjust the regimen as indicated.  Her updated medication list for this problem includes:    Simvastatin 40 Mg Tabs (Simvastatin) .Marland Kitchen... Take 1/2 tablet by mouth every morning and 1 tablet by mouth  at bedtime for cholesterol  Orders: T-Lipid Profile ([phone]) T-Comprehensive Metabolic Panel [phone])  Labs Reviewed: SGOT: 19 (09/11/2009)   SGPT: 13 (09/11/2009)   HDL:65 (09/11/2009), 67 (04/01/2009)  LDL:93 (09/11/2009), 95 (04/01/2009)  Chol:169 (09/11/2009), 174 (04/01/2009)  Trig:53 (09/11/2009), 58 (04/01/2009)  Problem # 4:  HYPERTENSION (ICD-401.9) Good control, will cont the same regimen.  Her updated medication list for this problem includes:    Hydrochlorothiazide 25 Mg Tabs (Hydrochlorothiazide) .Marland Kitchen... Take 1 tablet by mouth once a day  Orders: T-Comprehensive Metabolic Panel ([id number]) T-Urine Microalbumin w/creat. ratio [phone])  BP  today: 128/75 Prior BP: 125/73 (09/09/2009)  Labs Reviewed: K+: 3.6 (09/11/2009) Creat: : 1.01 (09/11/2009)   Chol: 169 (09/11/2009)   HDL: 65 (09/11/2009)   LDL: 93 (09/11/2009)   TG: 53 (09/11/2009)  Problem # 5:  GERD (ICD-530.81) Well controlled, cont to monitor.  Labs Reviewed: Hgb: 12.5 (08/06/2008)   Hct: 39.4 (08/06/2008)  Complete Medication List: 1)  Aspirin 81 Mg Tbec (Aspirin) .... Take 1 tablet by mouth once a day 2)  Hydrochlorothiazide 25 Mg Tabs (Hydrochlorothiazide) .... Take 1 tablet by mouth once a day 3)  Actos 15 Mg Tabs (Pioglitazone hcl) .... Take 1 tablet by mouth once a day 4)  Accu-chek Compact Test Drum Strp (Glucose blood) .... Test once daily 5)  Accu-chek Multiclix Lancets Misc (Lancets) .... Used to test blood sugar once daily 6)  Simvastatin 40 Mg Tabs (Simvastatin) .... Take 1/2 tablet by mouth every morning and 1 tablet by mouth at bedtime for cholesterol  Patient Instructions: 1)  Please schedule a follow-up appointment in 3 months. 2)  Please check your blood pressure regularly, if it is >170 please call clinic at [phone] 3)  Please check your sugar levels regularly and remember to bring the meter with you to the next clinic appointment, if the sugars are > 350 or < 60 please call us at [phone] Prescriptions: SIMVASTATIN 40 MG TABS (SIMVASTATIN) Take 1/2 tablet by mouth every morning and 1 tablet by mouth at bedtime for cholesterol  #45 x 3   Entered and Authorized by:   Mliss Sax MD   Signed by:   Mliss Sax MD on 03/08/2010   Method used:   Electronically to        [facility] [address] (retail)       [address] [place], [place]  [address]       Ph: [phone]       Fax: [phone]   RxID:   [id number] ACCU-CHEK MULTICLIX LANCETS  MISC (LANCETS) used to test blood sugar once daily  #102 x prn   Entered and Authorized by:   Mliss Sax MD   Signed by:   Mliss Sax MD on 03/08/2010   Method  used:   Electronically to        [facility] [address] (retail)       [address] [place], [place]  [address]       Ph: [phone]       Fax: [phone]   RxID:   [id number] ACCU-CHEK COMPACT TEST DRUM  STRP (GLUCOSE BLOOD) test once daily  #1 mth supply x prn   Entered and Authorized by:   Mliss Sax MD   Signed by:   Mliss Sax MD on 03/08/2010  Method used:   Electronically to        [facility] [address] [address] (retail)       [address] [place], [place]  [address]       Ph: [phone]       Fax: [phone]   RxID:   [id number] ACTOS 15 MG TABS (PIOGLITAZONE HCL) Take 1 tablet by mouth once a day  #30 Tablet x 11   Entered and Authorized by:   Mliss Sax MD   Signed by:   Mliss Sax MD on 03/08/2010   Method used:   Electronically to        [facility] [address] (retail)       [address]       [place], [place]  [address]       Ph: [phone]       Fax: [phone]   RxID:   [id number] HYDROCHLOROTHIAZIDE 25 MG  TABS (HYDROCHLOROTHIAZIDE) Take 1 tablet by mouth once a day  #30 Tablet x 11   Entered and Authorized by:   Mliss Sax MD   Signed by:   Mliss Sax MD on 03/08/2010   Method used:   Electronically to        [facility] [address] (retail)       [address]       [place], [place]  [address]       Ph: [phone]       Fax: [phone]   RxID:   [id number] ASPIRIN 81 MG  TBEC (ASPIRIN) Take 1 tablet by mouth once a day  #30 x 3   Entered and Authorized by:   Mliss Sax MD   Signed by:   Mliss Sax MD on 03/08/2010   Method used:   Electronically to        [facility] [address] (retail)       [address]       [place], [place]  [address]       Ph: [phone]       Fax: [phone]   RxID:   [id number]  Process Orders Check Orders Results:     [facility]: ABN not required for this insurance Order queued for requisitioning for [facility]: March 08, 2010 10:28 AM  Tests Sent for requisitioning (March 08, 2010 10:28 AM):     03/08/2010: [facility] -- T-TSH [phone] (signed)     03/08/2010: [facility] -- T-Lipid Profile [phone] (signed)     03/08/2010: [facility] -- T-Comprehensive Metabolic Panel [[id number]] (signed)     03/08/2010: [facility] -- T-Urine Microalbumin w/creat. ratio [[id number]] (signed)    Prevention & Chronic Care Immunizations   Influenza vaccine: Fluvax Non-MCR  (09/09/2009)   Influenza vaccine deferral: Not available  (05/06/2009)    Tetanus booster: Not documented   Td booster deferral: Not indicated  (03/08/2010)    Pneumococcal vaccine: Not documented   Pneumococcal vaccine deferral: Not indicated  (05/06/2009)    H. zoster vaccine: Not documented   H. zoster vaccine deferral: Not indicated  (03/08/2010)  Colorectal Screening   Hemoccult: Negative  (09/18/2006)   Hemoccult action/deferral: Not indicated  (03/08/2010)    Colonoscopy: Done  (  10/02/2006)   Colonoscopy due: 10/02/2016  Other Screening   Pap smear:  Specimen Adequacy: Satisfactory for evaluation.   Interpretation/Result:Negative for intraepithelial Lesion or Malignancy.   Location: [facility].    (04/01/2009)   Pap smear due: 04/02/2011    Mammogram: No specific mammographic evidence of malignancy.  No significant changes compared to previous study.  Assessment: BIRADS 1.   (09/02/2009)   Mammogram action/deferral: Screening mammogram in 1 year.     (09/01/2008)   Mammogram due: 09/2011    DXA bone density scan: Not documented   Smoking status: never  (03/08/2010)  Diabetes Mellitus   HgbA1C: 6.7  (03/08/2010)   Hemoglobin A1C due: 08/06/2009    Eye exam: No diabetic retinopathy.  ou   (01/05/2010)   Eye exam due:  01/2011    Foot exam: Not documented   Foot exam action/deferral: Deferred   High risk foot: Not documented   Foot care education: Not documented    Urine microalbumin/creatinine ratio: 4.8  (05/28/2009)   Urine microalbumin action/deferral: Ordered   Urine microalbumin/cr due: 06/24/2009    Diabetes flowsheet reviewed?: Yes   Progress toward A1C goal: Unchanged  Lipids   Total Cholesterol: 169  (09/11/2009)   LDL: 93  (09/11/2009)   LDL Direct: Not documented   HDL: 65  (09/11/2009)   Triglycerides: 53  (09/11/2009)   Lipid panel due: 10/02/2009    SGOT (AST): 19  (09/11/2009)   SGPT (ALT): 13  (09/11/2009) CMP ordered    Alkaline phosphatase: 66  (09/11/2009)   Total bilirubin: 0.9  (09/11/2009)    Lipid flowsheet reviewed?: Yes   Progress toward LDL goal: Unchanged  Hypertension   Last Blood Pressure: 128 / 75  (03/08/2010)   Serum creatinine: 1.01  (09/11/2009)   Serum potassium 3.6  (09/11/2009) CMP ordered     Hypertension flowsheet reviewed?: Yes   Progress toward BP goal: At goal  Self-Management Support :   Personal Goals (by the next clinic visit) :     Personal A1C goal: 7  (05/06/2009)     Personal blood pressure goal: 130/80  (05/06/2009)     Personal LDL goal: 100  (05/06/2009)    Patient will work on the following items until the next clinic visit to reach self-care goals:     Medications and monitoring: take my medicines every day, check my blood pressure, bring all of my medications to every visit, weigh myself weekly, examine my feet every day  (03/08/2010)     Eating: drink diet soda or water instead of juice or soda, eat more vegetables, use fresh or frozen vegetables, eat foods that are low in salt, eat baked foods instead of fried foods, eat fruit for snacks and desserts, limit or avoid alcohol  (03/08/2010)     Activity: take a 30 minute walk every day, take the stairs instead of the elevator  (03/08/2010)    Diabetes self-management support:  Written self-care plan, Education handout, Resources for patients handout  (03/08/2010)   Diabetes care plan printed   Diabetes education handout printed   Last diabetes self-management training by diabetes educator: 05/11/2008   Last medical nutrition therapy: 05/11/2008    Hypertension self-management support: Written self-care plan, Education handout, Resources for patients handout  (03/08/2010)   Hypertension self-care plan printed.   Hypertension education handout printed    Lipid self-management support: Written self-care plan, Education handout, Resources for patients handout  (03/08/2010)   Lipid self-care plan printed.   Lipid  education Geologist, engineering.   Laboratory Results   Blood Tests   Date/Time Received: March 08, 2010 10:19 AM Date/Time Reported: Alric Quan  March 08, 2010 10:19 AM   HGBA1C: 6.7%   (Normal Range: Non-Diabetic - 3-6%   Control Diabetic - 6-8%) CBG Random:: 149mg /dL

## 2010-12-13 NOTE — Progress Notes (Signed)
Summary: diabetes support/dmr  Phone Note Outgoing Call   Call placed by: Jamison Neighbor RD,CDE,  March 08, 2010 1:42 PM Summary of Call: caled patient to see if she wanted to reschedule lissed appoitment from this morning. She wants to schedule on same day she sees doctor at next visit. Put future flag in to office manager to arrange at he rJuoly appointment.

## 2010-12-13 NOTE — Assessment & Plan Note (Signed)
Summary: EST-ROUTINE CHECKUP/CH   Vital Signs:  Patient profile:   65 year old female Height:      65 inches (165.10 cm) Weight:      170.7 pounds (77.59 kg) BMI:     28.51 Temp:     97.4 degrees F (36.33 degrees C) oral Pulse rate:   67 / minute BP sitting:   122 / 79  (right arm)  Vitals Entered By: Stanton Kidney Ditzler RN (September 08, 2010 10:42 AM) Is Patient Diabetic? Yes Did you bring your meter with you today? No Pain Assessment Patient in pain? no      Nutritional Status BMI of 25 - 29 = overweight Nutritional Status Detail appetite good CBG Result 148  Have you ever been in a relationship where you felt threatened, hurt or afraid?denies   Does patient need assistance? Functional Status Self care Ambulation Normal Comments FU BP.   Primary Care Kesa Birky:  Chauncey Reading DO   History of Present Illness: 65 yo female with PMH outlined below presents to [facility] for regular follow up. She reports compliance with diabetic and BP medications, she also follows recommended diet with lots of vegetables, fiber, and fruits. She  has no other concerns at the time. No recent sicknesses or hospitalizaitons. No episodes of chest pain, SOB, palpitations, no fever or chills. No specific abdominal or urinary concerns. No recent changes in appetite, weight, sleep patterns, mood.  She tells me she take cholesterol medication at night time and tolerates it well.    Preventive Screening-Counseling & Management  Alcohol-Tobacco     Alcohol drinks/day: <1     Smoking Status: never     Smoking Cessation Counseling: yes  Caffeine-Diet-Exercise     Does Patient Exercise: yes     Type of exercise: WALKING     Times/week: 1-2  Problems Prior to Update: 1)  Hypertension  (ICD-401.9) 2)  Hyperlipidemia  (ICD-272.4) 3)  Tinea Pedis  (ICD-110.4) 4)  Dysuria  (ICD-788.1) 5)  Vaginitis  (ICD-616.10) 6)  Diabetes Mellitus, Type II  (ICD-250.00) 7)  Thyroid Function Test, Abnormal   (ICD-794.5) 8)  Lipoma  (ICD-214.9) 9)  Sexual Activity, High Risk  (ICD-V69.2) 10)  H/F Syncope, Vasovagal  (ICD-780.2) 11)  Trichomonal Vaginitis  (ICD-131.01) 12)  Postmenopausal Bleeding  (ICD-627.1) 13)  Helicobacter Pylori Gastritis  (ICD-041.86) 14)  Hyperlipidemia  (ICD-272.4) 15)  Angina Pectoris  (ICD-413.9) 16)  Joint Stiffness, Hand  (ICD-719.54) 17)  Postmenopausal On Hormone Replacement Therapy  (ICD-V07.4) 18)  Diabetes Mellitus  (ICD-250.00) 19)  Gerd  (ICD-530.81) 20)  Prolapse, Vaginal Wall, 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 Take 1 Tablet By Mouth Once A Day 2)  Hydrochlorothiazide 25 Mg  Tabs (Hydrochlorothiazide) .... Take 1 Tablet By Mouth Once A Day 3)  Actos 15 Mg Tabs (Pioglitazone Hcl) .... Take 1 Tablet By Mouth Once A Day 4)  Accu-Chek Compact Test Drum  Strp (Glucose Blood) .... Test Once Daily 5)  Accu-Chek Multiclix Lancets  Misc (Lancets) .... Used To Test Blood Sugar Once Daily 6)  Simvastatin 40 Mg Tabs (Simvastatin) .... Take 1/2 Tablet By Mouth Every Morning and 1 Tablet By Mouth At Bedtime For Cholesterol  Current Medications (verified): 1)  Aspirin 81 Mg  Tbec (Aspirin) .... Take 1  Tablet By Mouth Once A Day 2)  Hydrochlorothiazide 25 Mg  Tabs (Hydrochlorothiazide) .... Take 1 Tablet By Mouth Once A Day 3)  Actos 15 Mg Tabs (Pioglitazone Hcl) .... Take 1 Tablet By Mouth Once A Day 4)  Accu-Chek Compact Test Drum  Strp (Glucose Blood) .... Test Once Daily 5)  Accu-Chek Multiclix Lancets  Misc (Lancets) .... Used To Test Blood Sugar Once Daily 6)  Simvastatin 40 Mg Tabs (Simvastatin) .... Take 1/2 Tablet By Mouth Every Morning and 1 Tablet By Mouth At Bedtime For Cholesterol  Allergies (verified): 1)  !  Flagyl 2)  ! Metformin Hcl (Metformin Hcl)  Past History:  Past Medical History: Last updated: 08/06/2008 Diabetes mellitus, type II Hyperlipidemia Hypertension Syncope, negative workup 10/08 Trichomonas  Social History: Last updated: 02/02/2009 Single. Sexually active with one female partner. Never Smoked Alcohol use-no Drug use-no Regular exercise-yes  Risk Factors: Alcohol Use: <1 (09/08/2010) Exercise: yes (09/08/2010)  Risk Factors: Smoking Status: never (09/08/2010)  Social History: Reviewed history from 02/02/2009 and no changes required. Single. Sexually active with one female partner. Never Smoked Alcohol use-no Drug use-no Regular exercise-yes  Review of Systems       per HPI  Physical Exam  General:  Well-developed,well-nourished,in no acute distress; alert,appropriate and cooperative throughout examination Lungs:  Normal respiratory effort, chest expands symmetrically. Lungs are clear to auscultation, no crackles or wheezes. Heart:  Normal rate and regular rhythm. S1 and S2 normal without gallop, murmur, click, rub or other extra sounds. Abdomen:  Bowel sounds positive,abdomen soft and non-tender without masses, organomegaly or hernias noted. Psych:  Cognition and judgment appear intact. Alert and cooperative with normal attention span and concentration. No apparent delusions, illusions, hallucinations   Impression & Recommendations:  Problem # 1:  DIABETES MELLITUS, TYPE II (ICD-250.00) Generally well controlled, will check A1C today since it will be due anyways beginning of the november. I have examined her feet today and the test results are WNL. She is not due for an eye doc appointment yet. Will cont the same regimen for now.  Her updated medication list for this problem includes:    Aspirin 81 Mg Tbec (Aspirin) .Marland Kitchen... Take 1 tablet by mouth once a day    Actos 15 Mg Tabs (Pioglitazone hcl) .Marland Kitchen... Take 1 tablet by mouth once a day  Orders: T-  Hemoglobin A1C (40347-42595) Capillary Blood Glucose/CBG (63875)  Labs Reviewed: Creat: 1.20 (03/08/2010)     Last Eye Exam: No diabetic retinopathy.  ou  (01/05/2010) Reviewed HgBA1c results: 7.0 (06/14/2010)  6.7 (03/08/2010)  Problem # 2:  HYPERLIPIDEMIA (ICD-272.4) She reports being compliant with medication and the recommended diet. Will check FLP on her next visit, she did not want to have it checked today and that is fine, iwll just have to follow up on it and will make no changes to her regimen.  Her updated medication list for this problem includes:    Simvastatin 40 Mg Tabs (Simvastatin) .Marland Kitchen... Take 1 tablet by mouth at bedtime for cholesterol  Labs Reviewed: SGOT: 24 (03/08/2010)   SGPT: 18 (03/08/2010)   HDL:79 (03/08/2010), 65 (09/11/2009)  LDL:111 (03/08/2010), 93 (64/33/2951)  Chol:200 (03/08/2010), 169 (09/11/2009)  Trig:50 (03/08/2010), 53 (09/11/2009)  Problem # 3:  HYPERTENSION (ICD-401.9) At goal, will cont the same regimen.  Her updated medication list for this problem includes:    Hydrochlorothiazide 25 Mg Tabs (Hydrochlorothiazide) .Marland Kitchen... Take 1 tablet by mouth once a day  Prior BP: 115/74 (06/14/2010)  Labs Reviewed: K+: 3.8 (03/08/2010)  Creat: : 1.20 (03/08/2010)   Chol: 200 (03/08/2010)   HDL: 79 (03/08/2010)   LDL: 111 (03/08/2010)   TG: 50 (03/08/2010)  Complete Medication List: 1)  Aspirin 81 Mg Tbec (Aspirin) .... Take 1 tablet by mouth once a day 2)  Hydrochlorothiazide 25 Mg Tabs (Hydrochlorothiazide) .... Take 1 tablet by mouth once a day 3)  Actos 15 Mg Tabs (Pioglitazone hcl) .... Take 1 tablet by mouth once a day 4)  Accu-chek Compact Test Drum Strp (Glucose blood) .... Test once daily 5)  Accu-chek Multiclix Lancets Misc (Lancets) .... Used to test blood sugar once daily 6)  Simvastatin 40 Mg Tabs (Simvastatin) .... Take 1 tablet by mouth at bedtime for cholesterol  Patient Instructions: 1)  Please schedule a follow-up appointment in 3  months. 2)  Please check your blood pressure regularly, if it is >170 please call clinic at [phone] 3)  Please check your sugar levels regularly and remember to bring the meter with you to the next clinic appointment, if the sugars are > 350 or < 60 please call us at [phone] Prescriptions: ASPIRIN 81 MG  TBEC (ASPIRIN) Take 1 tablet by mouth once a day  #30 x 3   Entered and Authorized by:   Mliss Sax MD   Signed by:   Mliss Sax MD on 09/08/2010   Method used:   Electronically to        [facility] [id number] (retail)       [address] [place], [place]  [address]       Ph: [phone]       Fax: [phone]   RxID:   [id number] SIMVASTATIN 40 MG TABS (SIMVASTATIN) Take 1 tablet by mouth at bedtime for cholesterol  #30 x 5   Entered and Authorized by:   Mliss Sax MD   Signed by:   Mliss Sax MD on 09/08/2010   Method used:   Electronically to        [facility] [address] (retail)       [address]       [place], [place]  [address]       Ph: [phone]       Fax: [phone]   RxID:   [id number]    Orders Added: 1)  T- Hemoglobin A1C [83036-23375] 2)  Capillary Blood Glucose/CBG [82948] 3)  Est. Patient Level III [09323]   Process Orders Check Orders Results:     [facility]: ABN not required for this insurance Order queued for requisitioning for [facility]: September 08, 2010 11:02 AM Tests Sent for requisitioning (September 08, 2010 11:02 AM):     09/08/2010: [facility] -- T- Hemoglobin A1C [[id number]] (signed)       Appended Document: EST-ROUTINE CHECKUP/CH      Diabetic Foot Exam Foot Inspection Is there a history of a foot ulcer?              No Is there a foot ulcer now?              No Can the patient see the bottom of their feet?          Yes Are the shoes appropriate in style and fit?          Yes Is there swelling or an abnormal foot  shape?  No Are the toenails long?                No Are the toenails thick?                No Are the toenails ingrown?              No Is there heavy callous build-up?              No Is there pain in the calf muscle (Intermittent claudication) when walking?    NoIs there a claw toe deformity?              No Is there elevated skin temperature?            No Is there limited ankle dorsiflexion?            No Is there foot or ankle muscle weakness?            No  Diabetic Foot Care Education Patient educated on appropriate care of diabetic feet.  Pulse Check          Right Foot          Left Foot Posterior Tibial:        normal            normal Dorsalis Pedis:        normal            normal    10-g (5.07) Semmes-Weinstein Monofilament Test Performed by: Mliss Sax MD          Right Foot          Left Foot Visual Inspection               Test Control      normal         normal Site 1         normal         normal Site 2         normal         normal Site 3         normal         normal Site 4         normal         normal Site 5         normal         normal Site 6         normal         normal Site 7         normal         normal Site 8         normal         normal Site 9         normal         normal Site 10         normal         normal  Impression      normal         normal  Allergies: 1)  ! Flagyl 2)  ! Metformin Hcl (Metformin Hcl)  Diabetes Management Exam:    Foot Exam (with socks and/or shoes not present):       Sensory-Monofilament:          Left foot: normal          Right foot: normal   Complete Medication List: 1)  Aspirin 81  Mg Tbec (Aspirin) .... Take 1 tablet by mouth once a day 2)  Hydrochlorothiazide 25 Mg Tabs (Hydrochlorothiazide) .... Take 1 tablet by mouth once a day 3)  Actos 15 Mg Tabs (Pioglitazone hcl) .... Take 1 tablet by mouth once a day 4)  Accu-chek Compact Test Drum Strp (Glucose blood) .... Test once daily 5)  Accu-chek  Multiclix Lancets Misc (Lancets) .... Used to test blood sugar once daily 6)  Simvastatin 40 Mg Tabs (Simvastatin) .... Take 1 tablet by mouth at bedtime for cholesterol      Prevention & Chronic Care Immunizations   Influenza vaccine: Fluvax Non-MCR  (09/09/2009)   Influenza vaccine deferral: Not available  (05/06/2009)    Tetanus booster: Not documented   Td booster deferral: Not indicated  (03/08/2010)    Pneumococcal vaccine: Not documented   Pneumococcal vaccine deferral: Not indicated  (05/06/2009)    H. zoster vaccine: Not documented   H. zoster vaccine deferral: Not indicated  (03/08/2010)  Colorectal Screening   Hemoccult: Negative  (09/18/2006)   Hemoccult action/deferral: Not indicated  (03/08/2010)    Colonoscopy: Done  (10/02/2006)   Colonoscopy due: 10/02/2016  Other Screening   Pap smear: NEGATIVE FOR INTRAEPITHELIAL LESIONS OR MALIGNANCY.  (06/14/2010)   Pap smear due: 04/02/2011    Mammogram: No specific mammographic evidence of malignancy.  No significant changes compared to previous study.  Assessment: BIRADS 1.   (09/02/2009)   Mammogram action/deferral: Screening mammogram in 1 year.     (09/01/2008)   Mammogram due: 09/2011    DXA bone density scan: Not documented   Smoking status: never  (09/08/2010)  Diabetes Mellitus   HgbA1C: 7.0  (06/14/2010)   Hemoglobin A1C due: 08/06/2009    Eye exam: No diabetic retinopathy.  ou   (01/05/2010)   Eye exam due: 01/2011    Foot exam: yes  (09/08/2010)   Foot exam action/deferral: Do today   High risk foot: No  (06/14/2010)   Foot care education: Done  (09/08/2010)    Urine microalbumin/creatinine ratio: 3.1  (03/08/2010)   Urine microalbumin action/deferral: Ordered   Urine microalbumin/cr due: 06/24/2009  Lipids   Total Cholesterol: 200  (03/08/2010)   LDL: 111  (03/08/2010)   LDL Direct: Not documented   HDL: 79  (03/08/2010)   Triglycerides: 50  (03/08/2010)   Lipid panel due:  10/02/2009    SGOT (AST): 24  (03/08/2010)   SGPT (ALT): 18  (03/08/2010)   Alkaline phosphatase: 76  (03/08/2010)   Total bilirubin: 0.9  (03/08/2010)  Hypertension   Last Blood Pressure: 122 / 79  (09/08/2010)   Serum creatinine: 1.20  (03/08/2010)   Serum potassium 3.8  (03/08/2010)  Self-Management Support :   Personal Goals (by the next clinic visit) :     Personal A1C goal: 7  (05/06/2009)     Personal blood pressure goal: 130/80  (05/06/2009)     Personal LDL goal: 100  (05/06/2009)    Diabetes self-management support: Written self-care plan, Education handout, Resources for patients handout  (06/14/2010)   Last diabetes self-management training by diabetes educator: 06/14/2010   Last medical nutrition therapy: 05/11/2008    Hypertension self-management support: Written self-care plan, Education handout, Resources for patients handout  (06/14/2010)    Lipid self-management support: Written self-care plan, Education handout, Resources for patients handout  (06/14/2010)    Nursing Instructions: Give Flu vaccine today Diabetic foot exam today   Appended Document: EST-ROUTINE CHECKUP/CH    Nutritional Status 148   Diabetic  Foot Exam Foot Inspection Is there a history of a foot ulcer?              No Is there a foot ulcer now?              No Can the patient see the bottom of their feet?          Yes Are the shoes appropriate in style and fit?          Yes Is there swelling or an abnormal foot shape?          No Are the toenails long?                No Are the toenails thick?                No Are the toenails ingrown?              No Is there heavy callous build-up?              No Is there pain in the calf muscle (Intermittent claudication) when walking?    NoIs there a claw toe deformity?              No Is there elevated skin temperature?            No Is there limited ankle dorsiflexion?            No Is there foot or ankle muscle weakness?             No  Diabetic Foot Care Education Patient educated on appropriate care of diabetic feet.  Pulse Check          Right Foot          Left Foot Posterior Tibial:        normal            normal Dorsalis Pedis:        normal            normal  High Risk Feet? No   10-g (5.07) Semmes-Weinstein Monofilament Test Performed by: Mliss Sax MD          Right Foot          Left Foot Visual Inspection               Test Control      normal         normal Site 1         normal         normal Site 2         normal         normal Site 3         normal         normal Site 4         normal         normal Site 5         normal         normal Site 6         normal         normal Site 7         normal         normal Site 8         normal         normal Site 9         normal         normal  Site 10         normal         normal  Impression      normal         normal   Depression History:      The patient denies a depressed mood most of the day and a diminished interest in her usual daily activities.         Preventive Screening-Counseling & Management  Alcohol-Tobacco     Alcohol drinks/day: <1     Smoking Status: never     Smoking Cessation Counseling: yes  Caffeine-Diet-Exercise     Does Patient Exercise: yes     Type of exercise: WALKING     Times/week: 1-2  Allergies: 1)  ! Flagyl 2)  ! Metformin Hcl (Metformin Hcl)  Diabetes Management Exam:    Foot Exam (with socks and/or shoes not present):       Sensory-Monofilament:          Left foot: normal          Right foot: normal   Complete Medication List: 1)  Aspirin 81 Mg Tbec (Aspirin) .... Take 1 tablet by mouth once a day 2)  Hydrochlorothiazide 25 Mg Tabs (Hydrochlorothiazide) .... Take 1 tablet by mouth once a day 3)  Actos 15 Mg Tabs (Pioglitazone hcl) .... Take 1 tablet by mouth once a day 4)  Accu-chek Compact Test Drum Strp (Glucose blood) .... Test once daily 5)  Accu-chek Multiclix Lancets Misc (Lancets) .... Used  to test blood sugar once daily 6)  Simvastatin 40 Mg Tabs (Simvastatin) .... Take 1 tablet by mouth at bedtime for cholesterol  Other Orders: Influenza Vaccine NON MCR (83382)   Orders Added: 1)  Influenza Vaccine NON MCR [[id number]]   Immunizations Administered:  Influenza Vaccine # 1:    Vaccine Type: Fluvax Non-MCR    Site: left deltoid    Mfr: GlaxoSmithKline    Dose: 0.5 ml    Route: IM    Given by: Stanton Kidney Ditzler RN    Exp. Date: 05/13/2011    Lot #: [id number]    VIS given: 06/07/10 version given September 08, 2010.  Flu Vaccine Consent Questions:    Do you have a history of severe allergic reactions to this vaccine? no    Any prior history of allergic reactions to egg and/or gelatin? no    Do you have a sensitivity to the preservative Thimersol? no    Do you have a past history of Guillan-Barre Syndrome? no    Do you currently have an acute febrile illness? no    Have you ever had a severe reaction to latex? no    Vaccine information given and explained to patient? yes    Are you currently pregnant? no   Immunizations Administered:  Influenza Vaccine # 1:    Vaccine Type: Fluvax Non-MCR    Site: left deltoid    Mfr: GlaxoSmithKline    Dose: 0.5 ml    Route: IM    Given by: Stanton Kidney Ditzler RN    Exp. Date: 05/13/2011    Lot #: [id number]    VIS given: 06/07/10 version given September 08, 2010.   Prevention & Chronic Care Immunizations   Influenza vaccine: Fluvax Non-MCR  (09/08/2010)   Influenza vaccine deferral: Not available  (05/06/2009)    Tetanus booster: Not documented   Td booster deferral: Not indicated  (03/08/2010)    Pneumococcal vaccine: Not documented   Pneumococcal vaccine deferral: Not indicated  (  05/06/2009)    H. zoster vaccine: Not documented   H. zoster vaccine deferral: Not indicated  (03/08/2010)  Colorectal Screening   Hemoccult: Negative  (09/18/2006)   Hemoccult action/deferral: Not indicated  (03/08/2010)    Colonoscopy: Done   (10/02/2006)   Colonoscopy due: 10/02/2016  Other Screening   Pap smear: NEGATIVE FOR INTRAEPITHELIAL LESIONS OR MALIGNANCY.  (06/14/2010)   Pap smear due: 04/02/2011    Mammogram: No specific mammographic evidence of malignancy.  No significant changes compared to previous study.  Assessment: BIRADS 1.   (09/02/2009)   Mammogram action/deferral: Screening mammogram in 1 year.     (09/01/2008)   Mammogram due: 09/2011    DXA bone density scan: Not documented   Smoking status: never  (09/08/2010)  Diabetes Mellitus   HgbA1C: 6.9  (09/08/2010)   Hemoglobin A1C due: 08/06/2009    Eye exam: No diabetic retinopathy.  ou   (01/05/2010)   Eye exam due: 01/2011    Foot exam: yes  (09/08/2010)   Foot exam action/deferral: Do today   High risk foot: No  (09/08/2010)   Foot care education: Done  (09/08/2010)    Urine microalbumin/creatinine ratio: 3.1  (03/08/2010)   Urine microalbumin action/deferral: Ordered   Urine microalbumin/cr due: 06/24/2009  Lipids   Total Cholesterol: 200  (03/08/2010)   LDL: 111  (03/08/2010)   LDL Direct: Not documented   HDL: 79  (03/08/2010)   Triglycerides: 50  (03/08/2010)   Lipid panel due: 10/02/2009    SGOT (AST): 24  (03/08/2010)   SGPT (ALT): 18  (03/08/2010)   Alkaline phosphatase: 76  (03/08/2010)   Total bilirubin: 0.9  (03/08/2010)  Hypertension   Last Blood Pressure: 122 / 79  (09/08/2010)   Serum creatinine: 1.20  (03/08/2010)   Serum potassium 3.8  (03/08/2010)  Self-Management Support :   Personal Goals (by the next clinic visit) :     Personal A1C goal: 7  (05/06/2009)     Personal blood pressure goal: 130/80  (05/06/2009)     Personal LDL goal: 100  (05/06/2009)    Patient will work on the following items until the next clinic visit to reach self-care goals:     Medications and monitoring: take my medicines every day, bring all of my medications to every visit  (09/08/2010)     Eating: drink diet soda or water instead  of juice or soda, eat more vegetables, use fresh or frozen vegetables, eat foods that are low in salt, eat baked foods instead of fried foods, eat fruit for snacks and desserts, limit or avoid alcohol  (09/08/2010)     Activity: take a 30 minute walk every day, take the stairs instead of the elevator  (09/08/2010)    Diabetes self-management support: Written self-care plan, Education handout, Resources for patients handout  (09/08/2010)   Diabetes care plan printed   Diabetes education handout printed   Last diabetes self-management training by diabetes educator: 06/14/2010   Last medical nutrition therapy: 05/11/2008    Hypertension self-management support: Written self-care plan, Education handout, Resources for patients handout  (09/08/2010)   Hypertension self-care plan printed.   Hypertension education handout printed    Lipid self-management support: Written self-care plan, Education handout, Resources for patients handout  (09/08/2010)   Lipid self-care plan printed.   Lipid education handout printed      Resource handout printed.   Nursing Instructions: Diabetic foot exam today    Laboratory Results   Blood Tests   Date/Time Received:  September 08, 2010 11:31 AM Date/Time Reported: Alric Quan  September 08, 2010 11:31 AM   HGBA1C: 6.9%   (Normal Range: Non-Diabetic - 3-6%   Control Diabetic - 6-8%)

## 2010-12-15 NOTE — Progress Notes (Signed)
Summary: diabetes support/dmr  Phone Note Outgoing Call   Call placed by: Jamison Neighbor RD,CDE,  December 09, 2010 3:36 PM Summary of Call: called to follow up,  stil has not bought sneakers or started walking. wants to see CDe same day as her next appointment.  will request that be scheduled.

## 2010-12-22 ENCOUNTER — Encounter: Payer: Self-pay | Admitting: Internal Medicine

## 2010-12-23 ENCOUNTER — Encounter: Payer: Self-pay | Admitting: Internal Medicine

## 2011-01-27 LAB — GLUCOSE, CAPILLARY: Glucose-Capillary: 133 mg/dL — ABNORMAL HIGH (ref 70–99)

## 2011-01-30 LAB — URINE CULTURE
Colony Count: 30000
Colony Count: 60000

## 2011-01-30 LAB — POCT URINALYSIS DIP (DEVICE)
Bilirubin Urine: NEGATIVE
Ketones, ur: NEGATIVE mg/dL
Specific Gravity, Urine: 1.025 (ref 1.005–1.030)

## 2011-01-30 LAB — URINALYSIS, ROUTINE W REFLEX MICROSCOPIC
Bilirubin Urine: NEGATIVE
Glucose, UA: NEGATIVE mg/dL
Hgb urine dipstick: NEGATIVE
Ketones, ur: NEGATIVE mg/dL
Protein, ur: NEGATIVE mg/dL
Urobilinogen, UA: 0.2 mg/dL (ref 0.0–1.0)

## 2011-02-18 LAB — POCT URINALYSIS DIP (DEVICE)
Bilirubin Urine: NEGATIVE
Nitrite: NEGATIVE
Urobilinogen, UA: 0.2 mg/dL (ref 0.0–1.0)
pH: 5 (ref 5.0–8.0)

## 2011-02-18 LAB — URINE CULTURE: Colony Count: 3000

## 2011-02-20 LAB — GLUCOSE, CAPILLARY: Glucose-Capillary: 147 mg/dL — ABNORMAL HIGH (ref 70–99)

## 2011-02-23 LAB — GLUCOSE, CAPILLARY: Glucose-Capillary: 141 mg/dL — ABNORMAL HIGH (ref 70–99)

## 2011-03-03 ENCOUNTER — Ambulatory Visit: Payer: Self-pay | Admitting: Internal Medicine

## 2011-03-13 ENCOUNTER — Other Ambulatory Visit: Payer: Self-pay | Admitting: Internal Medicine

## 2011-03-28 NOTE — Op Note (Signed)
[name], [name]                 ACCOUNT NO.:  [id number]   MEDICAL RECORD NO.:  [id number]          PATIENT TYPE:  AMB   LOCATION:  [facility]                           [facility]:  [facility]   PHYSICIAN:  Allie Bossier, MD        DATE OF BIRTH:  07/22/1947   DATE OF PROCEDURE:  DATE OF DISCHARGE:                               OPERATIVE REPORT   PREOPERATIVE DIAGNOSIS:  Postmenopausal bleeding.   POSTOPERATIVE DIAGNOSES:  1. Postmenopausal bleeding.  2. Uterine polyp.   PROCEDURE:  Diagnostic hysteroscopy and polyp resection.   SURGEON:  Myra C. Marice Potter, M.D.   ANESTHESIA:  LMA.   COMPLICATIONS:  Asymptomatic, hemostatic uterine perforation.   ESTIMATED BLOOD LOSS:  Minimal.   SPECIMEN:  Uterine polyps.   DETAILED PROCEDURE AND FINDINGS:  The risks, benefits, and alternatives  of surgery were explained, understood and accepted.  Consents were  signed.  She was taken to the operating room.  General anesthesia was  applied without complication.  She was placed in a dorsal lithotomy  position.  Her vagina was prepped and draped in usual sterile fashion.  Her bladder was emptied with a Robinson catheter.  Bimanual exam  revealed normal size and shape anteverted uterus and nonenlarged adnexa.  A speculum was placed.  The anterior lip of the cervix was grasped with  a single-tooth tenaculum.  The multiparous cervix was easily dilated  with Hegar dilators to accommodate a diagnostic hysteroscope.  Glycine  was the distention medium used.  The entire cavity was inspected and  noted to be hemostatic.  In the left side of the fundus, there was noted  to be a tract that indicated a perforation, but it was not bleeding.  There was a large polyp noted in the midportion of the canal.  I  withdrew the hysteroscope and used a polyp forceps to grasp the polyp.  It was removed and hysteroscopy confirmed that it was completely  removed.  The tenaculum was removed.  No bleeding was noted from either  the  tenaculum site or the cervical os.  She was taken to the recovery  room in stable condition after being awakened from anesthesia.  Instruments, sponge, and needle counts were correct.  She tolerated the  procedure well.      Allie Bossier, MD  Electronically Signed    MCD/MEDQ  D:  03/09/2008  T:  03/10/2008  Job:  [phone]

## 2011-03-28 NOTE — Group Therapy Note (Signed)
[name], [name]                 ACCOUNT NO.:  [id number]   MEDICAL RECORD NO.:  [id number]          PATIENT TYPE:  WOC   LOCATION:  [facility]                   FACILITY:  [facility]   PHYSICIAN:  Allie Bossier, MD        DATE OF BIRTH:  07/22/1947   DATE OF SERVICE:                                  CLINIC NOTE   [name] is a 65 year old black female who was sent here from the  outpatient clinic for a history of postmenopausal bleeding.  She  initially denied having bleeding but then said she had had bleeding for  about a month (since December 2008).  When asked further about the  bleeding, she said that she was on hormone replacement therapy for about  8 years, which was stopped in the summer of 2008.  When stopping the  hormone therapy, she started bleeding for about a month,  which did  resolve.  She denies any other current problems.  She does say that her  last normal menstrual period was about 3 or 4 years ago.   PAST MEDICAL HISTORY:  Hypertension, hypercholesterolemia.   PAST SURGICAL HISTORY:  Tubal ligation 30+ years ago.   MEDICATIONS:  She takes hydrochlorothiazide 25 mg daily, clonidine 0.1  mg daily, simvastatin 40 mg q.h.s., aspirin 120 mg every other day.   SOCIAL HISTORY:  She denies tobacco, alcohol or drug use.   FAMILY HISTORY:  Negative for breast, GYN and colon malignancy.  She  does say that her mother has diabetes.   REVIEW OF SYSTEMS:  She is a Administrator, arts on a school bus.  She has  been abstinent for about 4 months.   PHYSICAL EXAMINATION:  VITAL SIGNS:  Her weight is 154 pounds, blood  pressure 129/77, height 5', 5, pulse 71.  HEENT:  Normal.  HEART:  Regular rate rhythm.  BREAST:  Exam normal.  LUNGS:  Clear to auscultation bilaterally.  ABDOMEN:  Benign.  EXTERNAL GENITALIA:  Normal.  Cervix is normal without lesions.  There  are no adnexal masses.  Her uterus is anteverted.   ULTRASOUND FINDINGS:  She has a 6.3-mm lining of her uterus.   ASSESSMENT/PLAN:  Postmenopausal bleeding, thickened endometrium.  We  will plan for a D&C and hysteroscopy.      Allie Bossier, MD     MCD/MEDQ  D:  02/05/2008  T:  02/05/2008  Job:  [id number]

## 2011-03-28 NOTE — Group Therapy Note (Signed)
[name], [name]                 ACCOUNT NO.:  [id number]   MEDICAL RECORD NO.:  [id number]          PATIENT TYPE:  WOC   LOCATION:  [facility]                   FACILITY:  [facility]   PHYSICIAN:  Allie Bossier, MD        DATE OF BIRTH:  07/22/1947   DATE OF SERVICE:  01/01/2008                                  CLINIC NOTE   CHIEF COMPLAINT:  Referral from [facility] for  postmenopausal bleeding.   HPI:  This is a 65 year old female, who was sent here from the  Outpatient Clinic for a history of postmenopausal bleeding. The patient  initially denied having bleeding, however, then said she had bleeding  for about a month. When asked about this bleeding, she states that she  has been on hormone replacement therapy for about eight years, which was  stopped this summer. Once stopping the hormone therapy, she noticed some  spotting for about a month, which did resolved. Since then, she has had  no further bleeding. According to her, the hormone replacement therapy  was started eight years ago to stop her periods and to  help her with  her hot flashes.  The patient denies any other issues right now.   MENSTRUAL HISTORY:  Her last menstrual period was about three to four  years ago per the patient.   OBSTETRICAL HISTORY:  She is a G4, P4, vaginal deliveries..   GYN HISTORY:  She had an abnormal Pap smear. She does not remember when.  She thinks there was a procedure done, however, it is unclear as to  what.  Last Pap smear was January 2009, and it was normal.   SURGICAL HISTORY:  None.   MEDICAL HISTORY:  Hypertension. Started on HCTZ as well as Clonidine.   MEDICATIONS:  As above.   FAMILY HISTORY:  Diabetes in the mother, otherwise unremarkable.   SOCIAL HISTORY:  Denies smoking. Denies tobacco. Denies alcohol. Denies  drug use.   REVIEW OF SYSTEMS:  As in the HPI, otherwise completely negative.   PHYSICAL EXAMINATION:  VITALS: Temperature is 97.4. Blood pressure  128/76. Heart rate 75. Weight is 157.9 pounds.  GENERAL EXAM: Not in acute distress, although she is anxious appearing.  CARDIOVASCULAR EXAM: Regular rate and rhythm, no rubs, gallops or  murmurs.  LUNGS: Clear to auscultation bilaterally.  ABDOMEN: Soft and nontender, no masses or hepatomegaly.  GU EXAM: The cervix appears normal without lesions. There are no adnexal  masses.  The uterus is anteverted. No abnormalities.  EXTREMITIES: No edema.   PROCEDURE NOTE:  Endometrial biopsy: The patient was consented and  papers were signed for the consent of an endometrial biopsy. The cervix  was sterilized with iodine. Tenaculum was placed at the 11:00 o'clock  and 1:00 o'clock position for an endometrial biopsy.  Pipette was placed  in the uterus without complications and biopsy sample was obtained and  placed in a specimen jar with no complications.   IMAGES:  See chart. Endometrial thickening.   ASSESSMENT/PLAN:  This is a 65 year old female with postmenopausal  bleeding.  Postmenopausal bleeding: The patient had  an ultrasound, which showed a  6.3 mm thickness of the endometrial canal as well as a sonohysterogram,  which showed a focal polyp with thickening in the endometrial canal  suggestive of a polyp, however, cannot rule out endometrial carcinoma.  Because of this, we did do an endometrial biopsy and will send the  results to pathology. The patient will follow up in two weeks for the  results. She will use ibuprofen for pain.      Johney Maine, MD    ______________________________  Allie Bossier, MD    JT/MEDQ  D:  01/01/2008  T:  01/02/2008  Job:  [id number]

## 2011-03-28 NOTE — Discharge Summary (Signed)
[name], [name]                 ACCOUNT NO.:  [id number]   MEDICAL RECORD NO.:  [id number]          PATIENT TYPE:  INP   LOCATION:  4740                         FACILITY:  [facility]   PHYSICIAN:  Jason Coop, MD DATE OF BIRTH:  07/22/1947   DATE OF ADMISSION:  09/01/2007  DATE OF DISCHARGE:  09/04/2007                               DISCHARGE SUMMARY   ATTENDING PHYSICIAN:  Madaline Guthrie, M.D.   DISCHARGE DIAGNOSES:  1. Syncope, due to dehydration and vasovagal syncope secondary to      abdominal pain and vomiting.  2. Mild abdominal pain, nausea and vomiting secondary to gastritis.  3. Diabetes mellitus, not on any pharmacological treatment.  4. Hypertension.  5. Hyperlipidemia.   MEDICATIONS ON DISCHARGE:  1. Aspirin 81 mg p.o. daily.  2. Zocor 40 mg p.o. daily.  3. Zofran 4 mg p.o. p.r.n. for nausea and vomiting q.6h.  4. Omeprazole 40 mg p.o. b.i.d.  5. Maalox suspension 10 mL p.o. p.r.n. t.i.d.  6. Neurontin 600 mg p.o. diarrhea.  7. Flonase 2 sprays in each nostril daily.  8. Hydrochlorothiazide 25 mg p.o. daily.   DISPOSITION:  [name] is discharged home.  She will see her primary  care physician, Dr. Landis Martins, on September 06, 2007.  She needs followup on  certain issues, including any persisting symptoms of dizziness or  abdominal pain, nausea or vomiting.  She will get BMET and her potassium  should be checked, as she is on hydrochlorothiazide.  She needs to be  addressed for rapid treatment of possible, hypertension and diabetes.  She had some plaque in the coronary arteries, and with rapid treatment  of her primary risk factors, a possible chance of stroke could be  abated.  So far, she is not on any medications for diabetes and her  glucose should be checked on the visit and she might need some oral  treatment for her diabetes.   PROCEDURE:  1. A 2D echocardiogram was done on September 03, 2007, which was showing      a normal left ventricular systolic  function with an ejection      fraction in the range of 55 to 65.  There was no diagnostic      evidence of left ventricular or regional wall motion abnormalities.      Left ventricular diastolic parameters were normal. There was no      mitral valve prolapse appreciated when compared with an      echocardiogram of February, 2004.   1. CT head without contrast was done on September 01, 2007, which was      negative.  2. Acute abdominal series was done on September 01, 2007, which was      negative for any acute abdominal findings.   CONSULTS:  None.   HISTORY OF PRESENT ILLNESS:  [name] is a 65 year old African American  lady with a past medical history of hypertension, hyperlipidemia, angina  and gastroesophageal reflux disease (GERD).  She came to the ER because  she passed out 8 hours prior to admission.  She wants to start  the story  with being sick in the stomach and throwing up once in the morning of  admission, without any blood.  She relates it to taking ice cream the  day before with her daughter.  No history of diarrhea.  It was her last  meal before this syncopal episode.  She was sitting in a car which was  not moving when she had the syncope.  She started to feel weak and  dehydrated and sweaty, shaking and numb in both arms.  Then she lost  consciousness for about 2 to 3 minutes.  No history of abnormal  movements, tongue biting, incontinence of urine or stool.  There was  some blurred vision.  She regained consciousness immediately and called  EMS.  No history of trauma to head or elsewhere.  No history of chest  pain or palpitations, but she was slightly dyspneic.  After a couple of  minutes, she was dizzy again but did not lose consciousness again.  Also, she feels dizzy whenever she stands up since then.  She passed out  a little more than a year ago and came to [facility].   PHYSICAL EXAMINATION:  VITALS:  Temperature 97, blood pressure 139/83,   pulse 71, respirations 24, saturating 99% on room air.  Her orthostatic  vitals were 180/77 with pulse of 74 lying down, 180/69 with a pulse of  70 sitting up, 140/60 with a pulse of 81 when standing.  GENERAL:  In general, she is in slight distress and says she is  nauseated.  HEENT:  Eyes were anicteric.  Oropharynx is moist.  NECK:  No mass, negative carotid bruit.  CHEST:  Clear to auscultation bilaterally.  CARDIOVASCULAR:  First and second heart sounds are normal, regular rate  and rhythm, no rubs, murmurs or gallops.  ABDOMEN:  Bowel sounds slightly decreased.  Tenderness in all the  abdomen, but more in the epigastrium, without any rebound tenderness.  EXTREMITIES:  No pitting pedal edema.  No cervical or supraclavicular  and axillary lymph nodes appreciated.  NEUROLOGICAL:  She is alert, oriented x3.  Power is 5/5 in all 4 limbs.  Bilateral light touch normal.  Tendon reflexes 2+ bilaterally  symmetrical.  Babinski bilateral downgoing.  Cranial nerves II-XII  normal.  Finger-to-nose bilaterally normal.   ADDITIONAL LABS:  WBC 9.3, hemoglobin 11.9, MCV 94.4, RDW 13.4 with  absolute white count of 8.9.  Sodium 137, potassium 3.4, chloride 102,  bicarb 26, BUN 14, creatinine 1.9, glucose 174, total bilirubin 1.3 with  indirect of 1.1.  Alk phos 9, AST 19, ALT 15, protein  71, albumin 3.7,  lipase 49, alcohol level less than 5, BNP 13, D-dimer 0.56, PT 7.5, INR  1.  Urine is positive for opiates.  Acute abdominal series is negative.  UA cloudy with moderate leukocytes.  Many WBC, RBCs are 2, P  T 13.5.  INR 1.  CT of the head was negative.   HOSPITAL COURSE:  1. Syncope.  The patient was monitored on the telemetry floor, where      she ran sinus rhythm with some first-degree heart block and her      lowest heart rate being in the upper 50s.  She was asymptomatic.      She was well-hydrated with fluids and she was not found to be      orthostatic later.  We discontinued the  medications which might be      causing the syncopal episodes,  including Neurontin and      hydrochlorothiazide.  A carotid Doppler was obtained, which showed      some stenosis, 40% to 60% right internal carotid artery (ICA)      stenosis and right external carotid was also stenotic and there was      no significant left ICA stenosis, vertebral artery was antegrade      bilaterally.  An echo was obtained, which was essentially normal,      with normal ejection fraction and no wall motion abnormalities.      The patient was feeling a lot better.  She was ambulating in the      hall without any dizziness.  Her abdominal pain and nausea and      vomiting were better after she was started on Protonix and      antacids.  Her syncope was deemed secondary to dehydration and the      vasovagal episode secondary to abdominal pain, nausea and vomiting.  2. Abdominal pain, nausea and vomiting.  It was deemed secondary to      gastritis and she did well after starting on her on antacid and      Protonix.  3. Diabetes mellitus.  Her glucose was running slightly elevated and      in the 150s, which was her highest number.  She may need some form      of therapy, oral hypoglycemic agent, in a clinic.  4. Hypokalemia, secondary to vomiting.  We repleted her potassium and      also checked her magnesium, which was 1.8.  5. Hypertension.  We just held her hydrochlorothiazide and her blood      pressure was running all right.  She was discharged and she was      started on her hydrochlorothiazide.  Her blood pressure was running      between 180 to 133 systolic and diastolic of 61 to 85.  6. Abnormal urinalysis.  The patient did not have any symptoms of      burning micturition and her urinalysis was abnormal, which was      showing cloudy urine with negative nitrate and moderate leukocytes.      Her UA was showing many squamous cell and many bacteria with 3 to 6      WBCs, and it was deemed contaminated  because of the squamous      cells.  She was started from the ER on ciprofloxacin, which she got      for 2 days and then we stopped it.  Her urine culture was showing      coagulase-negative Staphylococcus aureus with a colony count of      25,000, and we did not treat for it except for the first few days      when she got ciprofloxacin.   DISCHARGE LABS:  WBCs 6, hemoglobin 12.9, platelets 217, sodium 137,  potassium 4.1, chloride 104, glucose 133, BUN 10, bicarb 25, creatinine  0.98 and calcium 0.93.   DISCHARGE VITALS:  Temperature 98.4, pulse 68, respirations 18, blood  pressure 123/83, saturating 99% on room air and CBG 107.      Jason Coop, MD  Electronically Signed     YP/MEDQ  D:  09/04/2007  T:  09/05/2007  Job:  [id number]   cc:   Chauncey Reading, D.O.

## 2011-03-31 NOTE — Op Note (Signed)
[name], [name]                 ACCOUNT NO.:  [id number]   MEDICAL RECORD NO.:  [id number]          PATIENT TYPE:  AMB   LOCATION:  [facility]                         [facility]ACILITY:  [facility]   PHYSICIAN:  Marlowe Kays, M.D.  DATE OF BIRTH:  07/22/1947   DATE OF PROCEDURE:  05/08/2006  DATE OF DISCHARGE:                                 OPERATIVE REPORT   PREOPERATIVE DIAGNOSIS:  Painful ganglion, dorsum left wrist.   POSTOPERATIVE DIAGNOSIS:  Painful ganglion, dorsum left wrist.   OPERATION:  Excision of painful ganglion, dorsum of the left wrist.   SURGEON:  Marlowe Kays, M.D.   ASSISTANT:  Nurse.   ANESTHESIA:  General.   INDICATIONS FOR PROCEDURE:  Long-standing mass of several years duration  worse recently that clinically had the characteristics of a ganglion. This  was confirmed at surgery.   PROCEDURE:  Satisfactory general anesthesia, pneumatic tourniquet, left  upper extremity was Esmarch out non-sterilely and prepped with DuraPrep from  mid forarm to fingertips and draped in a sterile field.  A transverse  incision was made over the very visible ganglion on the mid dorsum of the  wrist.  The incision was carried down through the subcutaneous tissue.  Potential bleeders were coagulated with bipolar cautery.  The ganglion had  dissected through the extensor retinaculum which I split slightly to expose  the extensor tendons which were retracted and the ganglion cyst dissected  from around them and followed down to the wrist capsule where the ganglion  was excised at its base.  I then closed the capsule with interrupted at 2-0  Vicryl and the same with the extensor retinaculum. The wound was irrigated  with sterile saline.  Soft tissue was infiltrated with 0.5% plain Marcaine.  The subcutaneous tissues were closed with 4-0 Vicryl and the skin with Steri-  Strips.  A dry sterile dressing and volar plaster splint were applied.  The  tourniquet was released.  She tolerated the  procedure well and was taken to  the recovery room in satisfactory condition with no complication.           ______________________________  Marlowe Kays, M.D.     JA/MEDQ  D:  05/08/2006  T:  05/08/2006  Job:  [id number]

## 2011-03-31 NOTE — Op Note (Signed)
[name], [name]                           ACCOUNT NO.:  [id number]   MEDICAL RECORD NO.:  [id number]                   PATIENT TYPE:  EMS   LOCATION:  URG                                  FACILITY:  [facility]   PHYSICIAN:  James L. Malon Kindle., M.D.          DATE OF BIRTH:  07/22/1947   DATE OF PROCEDURE:  05/31/2004  DATE OF DISCHARGE:  05/29/2004                                 OPERATIVE REPORT   PROCEDURE:  Colonoscopy and polypectomy.   ENDOSCOPIST:  Llana Aliment. Edwards, M.D.   MEDICATIONS:  Fentanyl 80 mcg, Versed 8 mg IV.   SCOPE:  Olympus pediatric adjustable colonoscope.   INDICATIONS:  Colon cancer evaluation in a woman who is 71 and has new onset  of rectal bleeding.   DESCRIPTION OF PROCEDURE:  The procedure had been explained to the patient  and consent obtained.  With the patient in the left lateral decubitus  position, the Olympus scope was inserted and advanced.  The prep was  excellent.  She had a tortuous colon.  Abdominal pressure and position  changes were required, and eventually we were able to reach the cecum.  The  ileocecal valve and the appendiceal orifice were seen.  The scope was  withdrawn from the cecum.  Hepatic flexure, transverse colon, descending  colon were seen well.  Near the junction of the descending and sigmoid, a  small polyp was removed with the snare.  This was estimated to be 3-5 mm and  sessile.  Initially, it was lost but then was found.  Down below at  approximately 40-50 cm, a 0.75 cm polyp was removed with the snare and  sucked through the scope.  These were placed in a single jar.  No other  polyps were seen in the sigmoid.  The rectum was free of polyps but did  reveal moderate size internal hemorrhoids.  The scope was withdrawn.  The  patient tolerated the procedure well.   ASSESSMENT:  1. Colon polyps, removed (211.3).  2. Internal hemorrhoids 455.0).   PLAN:  Routine post polypectomy instructions.                          James L. Malon Kindle., M.D.    Waldron Session  D:  05/31/2004  T:  05/31/2004  Job:  [id number]   cc:   C. Ulyess Mort, M.D.  [address]  [place]  [place] [address]  Fax: [phone]

## 2011-03-31 NOTE — Discharge Summary (Signed)
[name], [name]                           ACCOUNT NO.:  [id number]   MEDICAL RECORD NO.:  [id number]                   PATIENT TYPE:  INP   LOCATION:  2005                                 FACILITY:  [facility]   PHYSICIAN:  Eliseo Gum, M.D.                DATE OF BIRTH:  07/22/1947   DATE OF ADMISSION:  12/23/2002  DATE OF DISCHARGE:  12/24/2002                                 DISCHARGE SUMMARY   DISCHARGE DIAGNOSES:  1. Syncope.  2. Hypokalemia.  3. Hypertension.  4. Postmenopausal symptoms.   DISCHARGE MEDICATIONS:  1. Paxil 20 mg daily.  2. Senokot 10 mL syrup q.h.s.-b.i.d.   HISTORY OF PRESENT ILLNESS:  This 65 year old white female with no past  medical history seen by primary care physician on December 22, 2002, and was  started on HCTZ and Effexor for hypertension and hot flashes respectively.  The patient felt globally weak and tired later that day and apparently went  to the kitchen and sat on a chair and reportedly felt lightheaded, dizzy,  and was sick to her stomach.  She called on her boyfriend and her family.  At that point, she fell and lost consciousness.  When patient arose, she  could not move and did have some confusion.   ALLERGIES:  No known drug allergies.   HOME MEDICATIONS:  1. HCTZ 25 mg daily.  2. Effexor XR 37.5 mg daily.  3. Ibuprofen and aspirin p.r.n.   SOCIAL HISTORY:  The patient never has smoked.  Denies alcohol, cocaine, or  IV drug use.  She lives with her boyfriend and her daughter is a Engineer, civil (consulting).   PHYSICAL EXAMINATION:  VITAL SIGNS:  Pulse 67, blood pressure 131/74,  temperature 97, respirations 20.  O2 saturation 100% on room air.  Orthostatics were checked and it was 131/74 with a pulse of 67 lying down  and 136/113 with a pulse of 77 sitting up.  The patient was unable to do the  standing orthostatics.  GENERAL:  Ill appearing but alert and oriented white female.  HEENT:  Extraocular muscles intact.  NECK:  No carotid bruits or  lymphadenopathy was noted.  Supple, nontender.  RESPIRATORY:  Lungs were clear to auscultation bilaterally with good air  movement.  CARDIOVASCULAR:  Heart was regular rate and rhythm without murmur, gallop,  or rub.  GI:  Positive bowel sounds, soft, nontender, nondistended.  EXTREMITIES:  No lower extremity edema.  The extremities were warm.  NEUROLOGICAL:  Cranial nerves were intact.  Muscular strength in upper and  lowers were 5/5 bilaterally with no focal weakness.  Psych was depressed but  good interaction and eye contact.   LABORATORY DATA:  Sodium 137, potassium 3.6, chloride 102, bicarb 29, BUN  20, creatinine 0.9, glucose 170.   DIAGNOSTIC DATA:  An EKG showed sinus rhythm at 68 beats per minute.  PR was  182 with a QRS  of 88.  QTc was 407.  It did show right axis deviation,  nonspecific T wave and ST changes.   HOSPITAL COURSE:  Problem #1 - SYNCOPE:  The differential was  neurocardiogenic such as vasovagal versus TIA versus medicine-induced  secondary to the start of diuretics.  Cardiac was not really in the  differential, especially given the history of symptoms prior to the syncopal  episode.  We held all of her medications and checked a head CT of which the  results were normal.  We suspected that the symptoms were from dehydration,  plus or minus new medicine effect.  After we discontinued the medications  and rehydrated the patient she felt much better.  Her telemetry was okay and  her cardiac enzymes were negative.  We discontinued her HCTZ and Effexor as  Effexor can have some symptoms of orthostatic hypertension as well.  Recommend considering low dose reserpine if needed for blood pressure in the  future.  We also corrected her hypokalemia which was probably secondary to  HCTZ and a weekend of diarrhea which she also complained of.  Recommend  scheduling echocardiogram as an outpatient for completeness.  Cardiac exam  was otherwise normal.   Problem #2 -  HYPERTENSION:  The patient was, as noted, started on HCTZ  yesterday.  It was recommended that if she needs blood pressure control in  the future to use reserpine.  The patient's blood pressure was stable  throughout her hospitalization and therefore we did not start her on any  agents during her stay.   Problem #3 - POSTMENOPAUSAL SYMPTOMS:  The patient was having what she  described as hot flashes.  Effexor was started in the clinic.  However,  because it has some side effects of orthostatic hypertension, we switched  her to Paxil for her symptoms before discharge.   DISCHARGE LABORATORIES:  White count 5.2, hemoglobin 11, platelets 198, PT  13.3, INR 1, PTT 24.  CK, CK-MB, and troponin were negative x3.  UDS was  negative.  Urine cultures were negative.                                               Eliseo Gum, M.D.    KC/MEDQ  D:  01/06/2003  T:  01/07/2003  Job:  [id number]   cc:   Duncan Dull, M.D.  [facility]  [place], [place] [address]  Fax: [phone]   Outpatient Clinic

## 2011-04-11 ENCOUNTER — Encounter: Payer: Self-pay | Admitting: Internal Medicine

## 2011-04-11 ENCOUNTER — Ambulatory Visit (INDEPENDENT_AMBULATORY_CARE_PROVIDER_SITE_OTHER): Payer: BC Managed Care – PPO | Admitting: Dietician

## 2011-04-11 ENCOUNTER — Ambulatory Visit (INDEPENDENT_AMBULATORY_CARE_PROVIDER_SITE_OTHER): Payer: BC Managed Care – PPO | Admitting: Internal Medicine

## 2011-04-11 DIAGNOSIS — I10 Essential (primary) hypertension: Secondary | ICD-10-CM

## 2011-04-11 DIAGNOSIS — E785 Hyperlipidemia, unspecified: Secondary | ICD-10-CM

## 2011-04-11 DIAGNOSIS — E119 Type 2 diabetes mellitus without complications: Secondary | ICD-10-CM

## 2011-04-11 LAB — GLUCOSE, CAPILLARY: Glucose-Capillary: 90 mg/dL (ref 70–99)

## 2011-04-11 LAB — COMPREHENSIVE METABOLIC PANEL
ALT: 16 U/L (ref 0–35)
BUN: 25 mg/dL — ABNORMAL HIGH (ref 6–23)
CO2: 29 mEq/L (ref 19–32)
Creat: 1.39 mg/dL — ABNORMAL HIGH (ref 0.40–1.20)
Glucose, Bld: 85 mg/dL (ref 70–99)
Total Bilirubin: 0.6 mg/dL (ref 0.3–1.2)

## 2011-04-11 LAB — LIPID PANEL
Cholesterol: 183 mg/dL (ref 0–200)
HDL: 61 mg/dL (ref >39)
Total CHOL/HDL Ratio: 3 Ratio
Triglycerides: 74 mg/dL (ref <150)
VLDL: 15 mg/dL (ref 0–40)

## 2011-04-11 LAB — POCT GLYCOSYLATED HEMOGLOBIN (HGB A1C): Hemoglobin A1C: 6.9

## 2011-04-11 MED ORDER — HYDROCHLOROTHIAZIDE 25 MG PO TABS: 25.0000 mg | ORAL_TABLET | Freq: Every day | ORAL | Status: DC

## 2011-04-11 MED ORDER — PIOGLITAZONE HCL 15 MG PO TABS: 15.0000 mg | ORAL_TABLET | Freq: Every day | ORAL | Status: DC

## 2011-04-11 MED ORDER — SIMVASTATIN 40 MG PO TABS: 40.0000 mg | ORAL_TABLET | Freq: Every day | ORAL | Status: DC

## 2011-04-11 NOTE — Assessment & Plan Note (Signed)
Fasting lipid panel was checked approximately one year ago. We will check it today and readjust medication regimen if indicated. Also check liver function test

## 2011-04-11 NOTE — Assessment & Plan Note (Signed)
Do not have recent A1c for the patient so we'll have to obtain one today to determine level of control. Patient reports compliance with Actos will have to reassess that based on A1c. I did examine her feet today and her exam findings are within normal limits. Patient is up-to-date on her yearly ophthalmologic examinations. Also check electrolyte panel and microalbumin/creatinine.

## 2011-04-11 NOTE — Assessment & Plan Note (Signed)
Blood pressure is well controlled on current medication regimen. Will make no changes to the dosing today. We will check electrolyte panel today.

## 2011-04-11 NOTE — Patient Instructions (Signed)
3 schedule followup appointment in 3 months. Also check her blood pressure regularly and call us back if numbers are higher than 140/90.

## 2011-04-11 NOTE — Progress Notes (Signed)
Diabetes Self-Management Training (DSMT)  Follow-Up 1 Visit  04/11/2011 [name], identified by name and date of birth, is a 65 y.o. female with Type 2 Diabetes. Year of diabetes diagnosis: 2010 Other persons present: no  ASSESSMENT Patient concerns are Healthy Lifestyle.  Last menstrual period 04/10/1996. There is no height or weight on file to calculate BMI. Lab Results  Component Value Date   LDLCALC 111* 03/08/2010   Lab Results  Component Value Date   HGBA1C 6.9 04/11/2011   Medication Nutrition Monitor: does not own one  Labs reviewed.  DIABETES BUNDLE: A1C in past 6 months? Yes.  Less than 7%? Yes LDL in past year? Yes.  Less than 100 mg/dL? Yes Microalbumin ratio in past year? No. Patient taking ACE or ARB? No.  Sent note to MD. Blood pressure less than 130/80? Yes. Foot exam in last year? Yes. Eye exam in past year? No.  Reminded patient to schedule eye exam. Tobacco use? No. Pneumovax? No Flu vaccine? Yes Asprin? No  Family history of diabetes: No Support systems: significant other Special needs: Simplified materials Prior DM Education: Yes Patients belief/attitude about diabetes: Diabetes can be controlled.  Self foot exams daily: Yes  Diabetes Complications: None   Medications See Medications list.  Has adequate knowledge   Exercise Plan Doing sendentary work, physical/active work and yard work for [employer] day.   Self-Monitoring Frequency of testing: not testing  Hyperglycemia symptoms: No   Meal Planning Some knowledge and Interested in improving    INDIVIDUAL DIABETES EDUCATION PLAN:  Nutrition management Chronic complications Goal setting _______________________________________________________________________  Intervention TOPICS COVERED TODAY:  Nutrition management  Role of diet in the treatment of diabetes and the relationship between the three main macronutritents and blood glucose control. Chronic complications  Lipid  levels, blood glucose control and heart disease Goal setting  Lifestyle issues that need to be addressed for better diabetes care  PATIENTS GOALS/PLAN (copy and paste in patient instructions so patient receives a copy): 1.  Learning Objective:       State what daily activities she should do to care for diabetes             State what yearly acitvities should be done to care for diabetes 2.  Behavioral Objective:         Nutrition: To improve blood glucose control I will increase servings of fruits and vegetables i get d Sometimes 25% Reducing Risk: To decrease the risk for complications, I will get labs drawn  Half of the time 50%  Personalized Follow-Up Plan for Ongoing Self Management Support:  Doctor's Office and CDE visits ______________________________________________________________________   Outcomes Expected outcomes: Demonstrated interest in learning.Expect positive changes in lifestyle.  Self-care Barriers: Low literacy, Lack of transportation, Lack of material resources  Education material provided: visual of plate method of meal planning to encourage more fruits and vegetables  Patient to contact team via Phone if problems or questions.  Time in: 1400     Time out: 1430  Future DSMT - 3-4 months   RILEY,DONNA

## 2011-04-11 NOTE — Progress Notes (Signed)
  Subjective:    Patient ID: [name], female    DOB: 03-20-1946, 65 y.o.   MRN: [id number]  HPI Patient is a 65 year old female with past medical history outlined below who presents to clinic for regular followup on her blood pressure, diabetes and cholesterol. She reports compliance with medications and recommended diet but does mention that she occasionally skips one day but not more than that. She denies recent significant for hospitalizations, no episode of chest pain or shortness of breath, no abdominal or urinary concerns.   Review of Systems For history of present illness    Objective:   Physical Exam    Constitutional: Vital signs reviewed.  Patient is a well-developed and well-nourished in no acute distress and cooperative with exam. Alert and oriented x3.  Cardiovascular: RRR, S1 normal, S2 normal, no MRG, pulses symmetric and intact bilaterally Pulmonary/Chest: CTAB, no wheezes, rales, or rhonchi Abdominal: Soft. Non-tender, non-distended, bowel sounds are normal, no masses, organomegaly, or guarding present.  Psychiatric: Normal mood and affect. speech and behavior is normal. Judgment and thought content normal. Cognition and memory are normal.       Assessment & Plan:

## 2011-06-19 ENCOUNTER — Other Ambulatory Visit: Payer: Self-pay | Admitting: Internal Medicine

## 2011-07-04 ENCOUNTER — Ambulatory Visit (INDEPENDENT_AMBULATORY_CARE_PROVIDER_SITE_OTHER): Payer: BC Managed Care – PPO | Admitting: Internal Medicine

## 2011-07-04 ENCOUNTER — Encounter: Payer: BC Managed Care – PPO | Admitting: Internal Medicine

## 2011-07-04 ENCOUNTER — Encounter: Payer: Self-pay | Admitting: Internal Medicine

## 2011-07-04 VITALS — BP 138/77 | HR 71 | Temp 98.0°F | Ht 65.0 in | Wt 174.2 lb

## 2011-07-04 DIAGNOSIS — E785 Hyperlipidemia, unspecified: Secondary | ICD-10-CM

## 2011-07-04 DIAGNOSIS — I10 Essential (primary) hypertension: Secondary | ICD-10-CM

## 2011-07-04 DIAGNOSIS — E119 Type 2 diabetes mellitus without complications: Secondary | ICD-10-CM

## 2011-07-04 DIAGNOSIS — Z23 Encounter for immunization: Secondary | ICD-10-CM

## 2011-07-04 LAB — GLUCOSE, CAPILLARY: Glucose-Capillary: 148 mg/dL — ABNORMAL HIGH (ref 70–99)

## 2011-07-04 MED ORDER — HYDROCHLOROTHIAZIDE 25 MG PO TABS: 25.0000 mg | ORAL_TABLET | Freq: Every day | ORAL | Status: DC

## 2011-07-04 MED ORDER — SIMVASTATIN 40 MG PO TABS: 40.0000 mg | ORAL_TABLET | Freq: Every day | ORAL | Status: DC

## 2011-07-04 MED ORDER — PIOGLITAZONE HCL 15 MG PO TABS: 15.0000 mg | ORAL_TABLET | Freq: Every day | ORAL | Status: DC

## 2011-07-04 NOTE — Assessment & Plan Note (Signed)
LDL slightly above goal. We have discussed goal LDL of less than 100. For now we'll continue same medication regimen with no changes. Liver function tests were checked recently and were within normal limits.

## 2011-07-04 NOTE — Progress Notes (Signed)
  Subjective:    Patient ID: [name], female    DOB: Jul 24, 1946, 65 y.o.   MRN: [id number]  HPI  Patient is 65 year old female with past medical history outlined below presented to clinic for regular followup of blood pressure, hyperlipidemia, diabetes. She reports compliance with her medications as well as recommended diet and exercise. She denies recent sicknesses or hospitalizations, no episodes of chest pain and shortness of breath, no fevers or chills, no abdominal or urinary concerns.  Review of Systems Constitutional: Denies fever, chills, diaphoresis, appetite change and fatigue.  HEENT: Denies photophobia, eye pain, redness, hearing loss, ear pain, congestion, sore throat, rhinorrhea, sneezing, mouth sores, trouble swallowing, neck pain, neck stiffness and tinnitus.   Respiratory: Denies SOB, DOE, cough, chest tightness,  and wheezing.   Cardiovascular: Denies chest pain, palpitations and leg swelling.  Gastrointestinal: Denies nausea, vomiting, abdominal pain, diarrhea, constipation, blood in stool and abdominal distention.  Genitourinary: Denies dysuria, urgency, frequency, hematuria, flank pain and difficulty urinating.  Musculoskeletal: Denies myalgias, back pain, joint swelling, arthralgias and gait problem.  Skin: Denies pallor, rash and wound.  Neurological: Denies dizziness, seizures, syncope, weakness, light-headedness, numbness and headaches.  Hematological: Denies adenopathy. Easy bruising, personal or family bleeding history  Psychiatric/Behavioral: Denies suicidal ideation, mood changes, confusion, nervousness, sleep disturbance and agitation      Objective:   Physical Exam Constitutional: Vital signs reviewed.  Patient is a well-developed and well-nourished in no acute distress and cooperative with exam. Alert and oriented x3.  Cardiovascular: RRR, S1 normal, S2 normal, no MRG, pulses symmetric and intact bilaterally Pulmonary/Chest: CTAB, no wheezes, rales, or  rhonchi Abdominal: Soft. Non-tender, non-distended, bowel sounds are normal, no masses, organomegaly, or guarding present.  Psychiatric: Normal mood and affect. speech and behavior is normal. Judgment and thought content normal. Cognition and memory are normal.          Assessment & Plan:

## 2011-07-04 NOTE — Assessment & Plan Note (Signed)
Well-controlled on current medication regimen. Will check electrolyte panel today if his creatinine was slightly elevated on her last visit. I have advised patient to continue taking her blood pressure regularly and to call us back if the numbers are higher than 140/90.

## 2011-07-04 NOTE — Assessment & Plan Note (Signed)
Well-controlled on current medication regimen. We will continue the same regimen for now. I have examined patient's feet and the results are within normal limits. She is up-to-date on yearly ophthalmology screening.

## 2011-08-08 LAB — BASIC METABOLIC PANEL
BUN: 14
CO2: 30
Chloride: 102
GFR calc non Af Amer: >60
Glucose, Bld: 120 — ABNORMAL HIGH
Potassium: 3.6

## 2011-08-08 LAB — CBC
HCT: 36.6
Hemoglobin: 12.6
RBC: 3.87
RDW: 13.1

## 2011-08-23 LAB — COMPREHENSIVE METABOLIC PANEL
ALT: 12
AST: 19
Albumin: 3.4 — ABNORMAL LOW
Calcium: 8.7
Creatinine, Ser: 0.92
GFR calc Af Amer: >60
GFR calc non Af Amer: >60
Sodium: 138
Total Protein: 6.5

## 2011-08-23 LAB — DIFFERENTIAL
Basophils Relative: 0
Eosinophils Absolute: 0
Eosinophils Relative: 0
Lymphs Abs: 0.3 — ABNORMAL LOW
Monocytes Relative: 1 — ABNORMAL LOW
Neutrophils Relative %: 95 — ABNORMAL HIGH

## 2011-08-23 LAB — URINE CULTURE

## 2011-08-23 LAB — URINE MICROSCOPIC-ADD ON

## 2011-08-23 LAB — CBC
HCT: 35.1 — ABNORMAL LOW
HCT: 36.8
Hemoglobin: 12.3
MCHC: 33.5
MCHC: 33.9
MCHC: 34
MCV: 94.3
MCV: 94.4
Platelets: 190
Platelets: 201
Platelets: 217
RBC: 3.58 — ABNORMAL LOW
RBC: 3.72 — ABNORMAL LOW
RDW: 13.3
RDW: 13.4
RDW: 13.4
WBC: 6
WBC: 9.3

## 2011-08-23 LAB — BASIC METABOLIC PANEL
BUN: 10
BUN: 9
CO2: 25
Calcium: 8.8
Calcium: 9.3
GFR calc non Af Amer: 58 — ABNORMAL LOW
Glucose, Bld: 107 — ABNORMAL HIGH
Glucose, Bld: 133 — ABNORMAL HIGH
Sodium: 135
Sodium: 137

## 2011-08-23 LAB — URINALYSIS, ROUTINE W REFLEX MICROSCOPIC
Nitrite: NEGATIVE
Specific Gravity, Urine: 1.016
Urobilinogen, UA: 0.2
pH: 6

## 2011-08-23 LAB — POCT CARDIAC MARKERS
CKMB, poc: <1 — ABNORMAL LOW
Operator id: 284141
Troponin i, poc: <0.05

## 2011-08-23 LAB — LIPID PANEL
Cholesterol: 161
HDL: 54
LDL Cholesterol: 96
Total CHOL/HDL Ratio: 3
Triglycerides: 55

## 2011-08-23 LAB — D-DIMER, QUANTITATIVE: D-Dimer, Quant: 0.56 — ABNORMAL HIGH

## 2011-08-23 LAB — RAPID URINE DRUG SCREEN, HOSP PERFORMED
Barbiturates: NOT DETECTED
Benzodiazepines: NOT DETECTED
Cocaine: NOT DETECTED

## 2011-08-23 LAB — HEPATIC FUNCTION PANEL
AST: 19
Albumin: 3.7
Total Bilirubin: 1.3 — ABNORMAL HIGH
Total Protein: 7.1

## 2011-08-23 LAB — B-NATRIURETIC PEPTIDE (CONVERTED LAB): Pro B Natriuretic peptide (BNP): <30

## 2011-08-23 LAB — I-STAT 8, (EC8 V) (CONVERTED LAB)
BUN: 14
Glucose, Bld: 174 — ABNORMAL HIGH
Potassium: 3.4 — ABNORMAL LOW
TCO2: 27
pH, Ven: 7.322 — ABNORMAL HIGH

## 2011-08-23 LAB — LIPASE, BLOOD: Lipase: 14

## 2011-08-23 LAB — MAGNESIUM: Magnesium: 1.8

## 2011-08-23 LAB — POCT I-STAT CREATININE: Operator id: 284141

## 2011-08-23 LAB — ETHANOL: Alcohol, Ethyl (B): <5

## 2011-08-23 LAB — PROTIME-INR
INR: 1
Prothrombin Time: 13.5

## 2011-08-24 LAB — POCT URINALYSIS DIP (DEVICE)
Bilirubin Urine: NEGATIVE
Glucose, UA: NEGATIVE
Nitrite: NEGATIVE
Urobilinogen, UA: 0.2
pH: 5.5

## 2011-08-25 LAB — POCT URINALYSIS DIP (DEVICE)
Bilirubin Urine: NEGATIVE
Nitrite: NEGATIVE
Urobilinogen, UA: 0.2
pH: 5.5

## 2011-08-25 LAB — WET PREP, GENITAL: Yeast Wet Prep HPF POC: NONE SEEN

## 2011-09-14 ENCOUNTER — Other Ambulatory Visit: Payer: Self-pay | Admitting: Ophthalmology

## 2011-09-14 DIAGNOSIS — I10 Essential (primary) hypertension: Secondary | ICD-10-CM

## 2011-09-20 NOTE — Progress Notes (Signed)
Addended by: Bufford Spikes on: 09/20/2011 11:19 AM   Modules accepted: Orders

## 2011-10-11 ENCOUNTER — Encounter: Payer: BC Managed Care – PPO | Admitting: Ophthalmology

## 2011-10-12 ENCOUNTER — Encounter: Payer: BC Managed Care – PPO | Admitting: Ophthalmology

## 2011-12-01 ENCOUNTER — Encounter: Payer: BC Managed Care – PPO | Admitting: Ophthalmology

## 2012-01-05 ENCOUNTER — Ambulatory Visit (INDEPENDENT_AMBULATORY_CARE_PROVIDER_SITE_OTHER): Payer: BC Managed Care – PPO | Admitting: Ophthalmology

## 2012-01-05 VITALS — BP 128/74 | HR 63 | Temp 97.6°F | Ht 65.0 in | Wt 183.1 lb

## 2012-01-05 DIAGNOSIS — E785 Hyperlipidemia, unspecified: Secondary | ICD-10-CM

## 2012-01-05 DIAGNOSIS — E119 Type 2 diabetes mellitus without complications: Secondary | ICD-10-CM

## 2012-01-05 DIAGNOSIS — I10 Essential (primary) hypertension: Secondary | ICD-10-CM

## 2012-01-05 DIAGNOSIS — N3090 Cystitis, unspecified without hematuria: Secondary | ICD-10-CM | POA: Insufficient documentation

## 2012-01-05 DIAGNOSIS — Z79899 Other long term (current) drug therapy: Secondary | ICD-10-CM

## 2012-01-05 DIAGNOSIS — N390 Urinary tract infection, site not specified: Secondary | ICD-10-CM | POA: Insufficient documentation

## 2012-01-05 LAB — BASIC METABOLIC PANEL
Calcium: 9.4 mg/dL (ref 8.4–10.5)
Glucose, Bld: 181 mg/dL — ABNORMAL HIGH (ref 70–99)
Potassium: 4.1 mEq/L (ref 3.5–5.3)
Sodium: 143 mEq/L (ref 135–145)

## 2012-01-05 LAB — POCT URINALYSIS DIPSTICK
Glucose, UA: NEGATIVE
Nitrite, UA: NEGATIVE
Protein, UA: NEGATIVE
Spec Grav, UA: >=1.03
Urobilinogen, UA: 0.2

## 2012-01-05 LAB — POCT GLYCOSYLATED HEMOGLOBIN (HGB A1C): Hemoglobin A1C: 7.1

## 2012-01-05 LAB — GLUCOSE, CAPILLARY: Glucose-Capillary: 203 mg/dL — ABNORMAL HIGH (ref 70–99)

## 2012-01-05 MED ORDER — SIMVASTATIN 40 MG PO TABS: 40.0000 mg | ORAL_TABLET | Freq: Every day | ORAL | Status: DC

## 2012-01-05 NOTE — Assessment & Plan Note (Signed)
Well controlled.  Will continue current plan.

## 2012-01-05 NOTE — Assessment & Plan Note (Signed)
Patient's A1C is less well controlled today than it was at last visit in August and she has gained about 10 lbs. She reports stopping the walking she was doing regularly due to cold weather. I encouraged her to restart and to avoid sweet drinks and large portions of carbohydrates.

## 2012-01-05 NOTE — Progress Notes (Deleted)
Patient ID: [name], female   DOB: 16-Sep-1946, 66 y.o.   MRN: [id number]

## 2012-01-05 NOTE — Assessment & Plan Note (Signed)
Urine dip was negative so did not send for culture or analysis.

## 2012-01-05 NOTE — Assessment & Plan Note (Signed)
Her LDL was extremely close to goal of 100 so will not increase regimen.

## 2012-01-05 NOTE — Progress Notes (Signed)
  Subjective:   Patient ID: [name] female   DOB: 1946/02/23 66 y.o.   MRN: [id number]  HPI: [name] is a 66 y.o. woman who presents for routine follow-up for diabetes, HTN, and cholesterol.  Diabetes: She says she has not been walking like she is supposed to. She says she knows what she feels like when her sugar gets too low. This happens when she wakes up in the morning and doesn't have anything to eat or if she goes many hours during the day without eating. Has had 3 episodes of fainting in the past which she associates with having low blood sugar. She drinks a lot of water. Still is drinking cranberry juice, lemonade etc. Goal is to walk at least twice a week.  Complains of some discomfort on the inside of vagina. Some white discharge as well. She is concerned for a bladder/urinary infection. She denies any pain with urination. She was treated for bladder infection at urgent care about 1 month ago.  Past Medical History  Diagnosis Date  . Diabetes mellitus   . GERD (gastroesophageal reflux disease)   . Hypertension   . HLD (hyperlipidemia)    Current Outpatient Prescriptions  Medication Sig Dispense Refill  . aspirin 81 MG chewable tablet Chew 81 mg by mouth daily.        Marland Kitchen glucose blood test strip 1 each by Other route as needed. Use as instructed       . hydrochlorothiazide 25 MG tablet Take 1 tablet (25 mg total) by mouth daily.  30 tablet  11  . Lancets (ACCU-CHEK MULTICLIX) lancets 1 each by Other route as needed. Use as instructed       . pioglitazone (ACTOS) 15 MG tablet Take 1 tablet (15 mg total) by mouth daily.  30 tablet  11  . simvastatin (ZOCOR) 40 MG tablet Take 1 tablet (40 mg total) by mouth at bedtime.  30 tablet  11   Family History  Problem Relation Age of Onset  . Stroke Neg Hx   . Heart disease Neg Hx   . Cancer Neg Hx    History   Social History  . Marital Status: Single    Spouse Name: N/A    Number of Children: N/A  . Years of Education:  N/A   Social History Main Topics  . Smoking status: Never Smoker   . Smokeless tobacco: Not on file  . Alcohol Use: No  . Drug Use: No  . Sexually Active: Yes    Birth Control/ Protection: None   Other Topics Concern  . Not on file   Social History Narrative  . No narrative on file    Objective:  Physical Exam: Filed Vitals:   01/05/12 1320  BP: 128/74  Pulse: 63  Temp: 97.6 F (36.4 C)  TempSrc: Oral  Height: 5\' 5"  (1.651 m)  Weight: 183 lb 1.6 oz (83.054 kg)  SpO2: 100%   General: borderline obese woman sitting in chair, in no apparent distress HEENT: PERRL, EOMI, no scleral icterus Cardiac: RRR, no rubs, murmurs or gallops Pulm: clear to auscultation bilaterally, moving normal volumes of air Abd: soft, nontender, nondistended, BS present, no suprapubic tenderness Neuro: alert and oriented X3, cranial nerves II-XII grossly intact Assessment & Plan:

## 2012-01-05 NOTE — Patient Instructions (Signed)
Please work on limiting sweet drinks cranberry juice, sweet tea, lemonade and try to walk as many times a week as you can.

## 2012-03-20 ENCOUNTER — Ambulatory Visit (INDEPENDENT_AMBULATORY_CARE_PROVIDER_SITE_OTHER): Payer: BC Managed Care – PPO | Admitting: Ophthalmology

## 2012-03-20 ENCOUNTER — Encounter: Payer: Self-pay | Admitting: Ophthalmology

## 2012-03-20 VITALS — BP 135/70 | HR 75 | Temp 97.4°F | Ht 65.0 in | Wt 179.0 lb

## 2012-03-20 DIAGNOSIS — E785 Hyperlipidemia, unspecified: Secondary | ICD-10-CM

## 2012-03-20 DIAGNOSIS — R059 Cough, unspecified: Secondary | ICD-10-CM

## 2012-03-20 DIAGNOSIS — Z0000 Encounter for general adult medical examination without abnormal findings: Secondary | ICD-10-CM | POA: Insufficient documentation

## 2012-03-20 DIAGNOSIS — R05 Cough: Secondary | ICD-10-CM | POA: Insufficient documentation

## 2012-03-20 DIAGNOSIS — R058 Other specified cough: Secondary | ICD-10-CM | POA: Insufficient documentation

## 2012-03-20 DIAGNOSIS — Z79899 Other long term (current) drug therapy: Secondary | ICD-10-CM

## 2012-03-20 DIAGNOSIS — E119 Type 2 diabetes mellitus without complications: Secondary | ICD-10-CM

## 2012-03-20 LAB — LIPID PANEL
Cholesterol: 161 mg/dL (ref 0–200)
HDL: 52 mg/dL (ref >39)
Total CHOL/HDL Ratio: 3.1 Ratio

## 2012-03-20 LAB — GLUCOSE, CAPILLARY: Glucose-Capillary: 187 mg/dL — ABNORMAL HIGH (ref 70–99)

## 2012-03-20 LAB — POCT GLYCOSYLATED HEMOGLOBIN (HGB A1C): Hemoglobin A1C: 7.3

## 2012-03-20 MED ORDER — GLUCOSE BLOOD VI STRP: ORAL_STRIP | Status: DC

## 2012-03-20 MED ORDER — PIOGLITAZONE HCL 30 MG PO TABS: 30.0000 mg | ORAL_TABLET | Freq: Every day | ORAL | Status: DC

## 2012-03-20 MED ORDER — GUAIFENESIN-CODEINE 100-10 MG/5ML PO SYRP: 5.0000 mL | ORAL_SOLUTION | Freq: Three times a day (TID) | ORAL | Status: AC | PRN

## 2012-03-20 NOTE — Assessment & Plan Note (Signed)
Prescribed patient cheratussin (codeine+robitussin) since she is focused on cough syrup. I also advised her that she can take naprosyn twice a day to help reduce the inflammation that contributes to cough (per Dr. Charlesetta Shanks recommendation). Is likely due to resolving bronchitis. If cough persists could consider post-nasal drip vs. GERD vs. Asthma.

## 2012-03-20 NOTE — Assessment & Plan Note (Signed)
Patient has poor insight into her disease and when I say that her diabetes is under worse control now than it was at her last visit she repeatedly says she will walk more, which is what she said at her last visit. A1c has crept from 6.7 to 7.1 to 7.3.  Her weight is down 4 lbs from last visit at which time she had gained 10 lbs. I have increased her Actos from 15 mg to 30mg  and asked her to check her blood sugar twice a day for 2 weeks until we see her next time to make sure that we do not cause any hypoglycemia. Patient has never had microalbumin/creatinine so will perform at next visit.

## 2012-03-20 NOTE — Assessment & Plan Note (Signed)
Lipid panel checked today, non-fasting

## 2012-03-20 NOTE — Assessment & Plan Note (Signed)
Patient requests pap smear at the end of visit. I told her that we could do this on her return visit.

## 2012-03-20 NOTE — Progress Notes (Signed)
  Subjective:   Patient ID: [name] female   DOB: 1946-07-22 66 y.o.   MRN: [id number]  HPI: [name] is a 67 y.o. woman who is here for routine follow up  Cough- patient has had cough for 3 weeks, went to see urgent care on Sunday for sinusitis-type symptoms had sinus pressure, runny nose etc. Was given a shot and likely a Z-pack. She is concerned that her cough is going to turn into a pneumonia. Not taking any medicine for allergies. Productive cough- clear with slight yellow tinge, was a darker yellow earlier. She stayed out of work for Tuesday & Wednesday. Had subjective fever and chills on Sunday, sweating a lot at night. She feels the cough isn't any better. Has some subjective shortness of breath due to congestion but is not limiting her activity. Constantly blowing nose.   DM- patient will walk more. Is not walking anymore.  Patient has poor insight into her health. She does not know what an antibiotic is, nor does she understand the concept of an A1c and how it is NOT affected by what she ate this morning even after I explain this to her several times.  Past Medical History  Diagnosis Date  . Diabetes mellitus   . GERD (gastroesophageal reflux disease)   . Hypertension   . HLD (hyperlipidemia)    Current Outpatient Prescriptions  Medication Sig Dispense Refill  . aspirin 81 MG chewable tablet Chew 81 mg by mouth daily.        Marland Kitchen glucose blood test strip 1 each by Other route as needed. Use as instructed       . hydrochlorothiazide 25 MG tablet Take 1 tablet (25 mg total) by mouth daily.  30 tablet  11  . Lancets (ACCU-CHEK MULTICLIX) lancets 1 each by Other route as needed. Use as instructed       . pioglitazone (ACTOS) 15 MG tablet Take 1 tablet (15 mg total) by mouth daily.  30 tablet  11  . simvastatin (ZOCOR) 40 MG tablet Take 1 tablet (40 mg total) by mouth at bedtime.  60 tablet  6   Family History  Problem Relation Age of Onset  . Stroke Neg Hx   . Heart  disease Neg Hx   . Cancer Neg Hx    History   Social History  . Marital Status: Single    Spouse Name: N/A    Number of Children: N/A  . Years of Education: N/A   Social History Main Topics  . Smoking status: Never Smoker   . Smokeless tobacco: None  . Alcohol Use: No  . Drug Use: No  . Sexually Active: Yes    Birth Control/ Protection: None   Other Topics Concern  . None   Social History Narrative  . None    Objective:  Physical Exam: Filed Vitals:   03/20/12 1133  BP: 135/70  Pulse: 75  Temp: 97.4 F (36.3 C)  TempSrc: Oral  Height: 5\' 5"  (1.651 m)  Weight: 179 lb (81.194 kg)  SpO2: 100%   General: sitting in chair HEENT: PERRL, EOMI, no scleral icterus Cardiac: RRR, no rubs, murmurs or gallops Pulm: clear to auscultation bilaterally, moving normal volumes of air Abd: soft, nontender, nondistended, BS present Ext: warm and well perfused, no pedal edema Neuro: alert and oriented X3, cranial nerves II-XII grossly intact  Assessment & Plan:

## 2012-03-20 NOTE — Patient Instructions (Addendum)
-  Take cough medicine and also can take aleeve twice a day which can help get cough under control -Walk more! -I have changed your actos to 30mg  daily, you can take TWO 15mg  pills until you get the 30mg  pills (and then you will only take one a day)  -Please check blood sugar twice a day for next 2 weeks- before breakfast and before dinner OR at bedtime

## 2012-07-10 ENCOUNTER — Encounter: Payer: Self-pay | Admitting: Internal Medicine

## 2012-07-10 ENCOUNTER — Ambulatory Visit (INDEPENDENT_AMBULATORY_CARE_PROVIDER_SITE_OTHER): Payer: BC Managed Care – PPO | Admitting: Internal Medicine

## 2012-07-10 VITALS — BP 137/68 | HR 69 | Temp 97.3°F | Ht 65.0 in | Wt 176.6 lb

## 2012-07-10 DIAGNOSIS — Z79899 Other long term (current) drug therapy: Secondary | ICD-10-CM

## 2012-07-10 DIAGNOSIS — E119 Type 2 diabetes mellitus without complications: Secondary | ICD-10-CM

## 2012-07-10 DIAGNOSIS — N898 Other specified noninflammatory disorders of vagina: Secondary | ICD-10-CM

## 2012-07-10 NOTE — Progress Notes (Signed)
Subjective:   Patient ID: [name] female   DOB: 11/10/1946 66 y.o.   MRN: [id number]  HPI: [name] is a 66 y.o. African American woman with a past medical history of diabetes type 2, hypertension, and hyperlipidemia who comes in for an acute visit concerning some vaginal discharge. The patient states that she has one sexual partner for the past 3 years that she sees intermittently and inconsistently throughout the year with her last sexual encounter about a month ago. She doesn't believe that he is having other relationships with other people but is unsure and has never asked. They do not use protection during encounters. His is during her last encounter she had some post coital bleeding of frank red blood that lasted about one day and then had darker vaginal discharge for the following week and that completed its course. She states that since that time for the past 2 weeks she's continued to have some yellow/green discharge that has an unpleasant odor and some itchiness. She does state that with previous encounters with this partner she has experienced several episodes of bright red blood post-coitally that have self resolved but this time discharge has continued. She denied any dyspareunia, abdominal pain, pelvic pain, or fevers/chills. She has no personal or family history of cervical, uterine, or ovarian cancer. All her previous Paps have been normal but she's insistent on having a repeat one done today. A discussion about the new USPTF of guidelines was had with the patient and she still insisted on having a Pap done today. She has no other complaints and is otherwise in good health. She has no personal history of an STD in the past and does not smoke, drink alcohol, or use illicit drugs.   Past Medical History  Diagnosis Date  . Diabetes mellitus   . GERD (gastroesophageal reflux disease)   . Hypertension   . HLD (hyperlipidemia)    Current Outpatient Prescriptions  Medication Sig  Dispense Refill  . aspirin 81 MG chewable tablet Chew 81 mg by mouth daily.        Marland Kitchen glucose blood test strip Check blood sugar twice a day before breakfast and before dinner  100 each  3  . hydrochlorothiazide 25 MG tablet Take 1 tablet (25 mg total) by mouth daily.  30 tablet  11  . Lancets (ACCU-CHEK MULTICLIX) lancets 1 each by Other route as needed. Use as instructed       . pioglitazone (ACTOS) 30 MG tablet Take 1 tablet (30 mg total) by mouth daily.  90 tablet  4  . simvastatin (ZOCOR) 40 MG tablet Take 1 tablet (40 mg total) by mouth at bedtime.  60 tablet  6   Family History  Problem Relation Age of Onset  . Stroke Neg Hx   . Heart disease Neg Hx   . Cancer Neg Hx    History   Social History  . Marital Status: Single    Spouse Name: N/A    Number of Children: N/A  . Years of Education: N/A   Social History Main Topics  . Smoking status: Never Smoker   . Smokeless tobacco: None  . Alcohol Use: No  . Drug Use: No  . Sexually Active: Yes    Birth Control/ Protection: None   Other Topics Concern  . None   Social History Narrative  . None   Review of Systems: pertinent listed in HPI  Objective:  Physical Exam: Filed Vitals:   07/10/12 9147  BP: 137/68  Pulse: 69  Temp: 97.3 F (36.3 C)  TempSrc: Oral  Height: 5\' 5"  (1.651 m)  Weight: 176 lb 9.6 oz (80.105 kg)  SpO2: 100%   General: NAD, well nourished Cardiac: RRR, no rubs, murmurs or gallops Pulm: clear to auscultation bilaterally, moving normal volumes of air Abd: soft, nontender, nondistended, BS present Pelvic exam: normal external genitalia, vulva, vagina, cervix, uterus and adnexa, VAGINA: vaginal lesion 9 o'clock position actively bleeding, post-estrongenized vaginal wall ,vaginal discharge - copious, green and malodorous, WET MOUNT collected, DNA probe for chlamydia and GC obtained, CERVIX: normal appearing cervix without discharge or lesions, cervical motion tenderness absent, multiparous os,  UTERUS: uterus is normal size, shape, consistency and nontender, PAP: Pap smear done today, exam chaperoned by Encarnacion Slates nurse. Ext: warm and well perfused, no pedal edema Neuro: alert and oriented X3, cranial nerves II-XII grossly intact  Assessment & Plan:  1.Vaginal discharge: Patient reports having inconsistently intermittent sex with a single female partner for the last 3 years. At her most recent encounter she noticed some bright red blood post coital but no dyspareunia and then some dark smelly discharge for the following week. She continued to have yellow/green discharge that has a "funny odor" for the past 2 weeks. She denied any fevers or chills and other constitutional symptoms. She did request to have STD screening today and Pap smear. All her previous Paps have been normal and she has no family/personal history of cervical, uterine, or ovarian cancer. On physical exam patient did have visible vaginal tear at 9:00 position that was still actively bleeding and post estrogenized vaginal tissue that was easily irritated along with some green discharge. -Pap smear performed today and patient educated on new USPTF guidelines and her no longer needing yearly screening -Wet prep, GC/Chlamydia of vaginal discharge -HIV, RPR, acute hepatitis blood work -UA performed -Patient was informed that if continues to have bloody/dark discharge similar to old periods (postmenopausal bleeding) that she would need to be reevaluated and see an OB/GYN physician for workup into possible endometrial cancer -Patient was educated on conservative management for vaginal tear  2.Diabetes: Patient globin A1c was 6.9 which is decreased from 7.3 on 5/13. Patient continues to take Actos 30 mg daily and is continuing to increase her exercise weekly and make better diet choices. -Patient had diabetic foot exam -Urine microalbuminemia performed -Patient was told to keep her ophthalmology appointment later on this year that  is already scheduled -Patient was told to make her regular checkup with her PCP for continued management  Pt was seen and discussed with Dr. Aundria Rud

## 2012-07-10 NOTE — Progress Notes (Signed)
agree

## 2012-07-11 LAB — URINALYSIS, ROUTINE W REFLEX MICROSCOPIC
Glucose, UA: NEGATIVE mg/dL
Hgb urine dipstick: NEGATIVE
Ketones, ur: NEGATIVE mg/dL
Nitrite: NEGATIVE
Specific Gravity, Urine: 1.022 (ref 1.005–1.030)
pH: 5.5 (ref 5.0–8.0)

## 2012-07-11 LAB — HEPATITIS PANEL, ACUTE
HCV Ab: NEGATIVE
Hepatitis B Surface Ag: NEGATIVE

## 2012-07-11 LAB — URINALYSIS, MICROSCOPIC ONLY
Casts: NONE SEEN
Crystals: NONE SEEN

## 2012-07-11 LAB — MICROALBUMIN / CREATININE URINE RATIO
Creatinine, Urine: 270 mg/dL
Microalb Creat Ratio: 3.7 mg/g (ref 0.0–30.0)
Microalb, Ur: 1 mg/dL (ref 0.00–1.89)

## 2012-07-11 LAB — RPR

## 2012-08-20 ENCOUNTER — Ambulatory Visit (INDEPENDENT_AMBULATORY_CARE_PROVIDER_SITE_OTHER): Payer: BC Managed Care – PPO | Admitting: *Deleted

## 2012-08-20 DIAGNOSIS — Z23 Encounter for immunization: Secondary | ICD-10-CM

## 2012-09-12 ENCOUNTER — Other Ambulatory Visit: Payer: Self-pay | Admitting: *Deleted

## 2012-09-12 DIAGNOSIS — I10 Essential (primary) hypertension: Secondary | ICD-10-CM

## 2012-09-12 MED ORDER — HYDROCHLOROTHIAZIDE 25 MG PO TABS: 25.0000 mg | ORAL_TABLET | Freq: Every day | ORAL | Status: DC

## 2013-03-03 ENCOUNTER — Other Ambulatory Visit: Payer: Self-pay | Admitting: Ophthalmology

## 2013-03-28 ENCOUNTER — Encounter (HOSPITAL_COMMUNITY): Payer: Self-pay | Admitting: Emergency Medicine

## 2013-03-28 ENCOUNTER — Emergency Department (INDEPENDENT_AMBULATORY_CARE_PROVIDER_SITE_OTHER): Payer: BC Managed Care – PPO

## 2013-03-28 ENCOUNTER — Emergency Department (INDEPENDENT_AMBULATORY_CARE_PROVIDER_SITE_OTHER)
Admission: EM | Admit: 2013-03-28 | Discharge: 2013-03-28 | Disposition: A | Discharge status: Home / Self Care | Payer: BC Managed Care – PPO | Source: Home / Self Care | Attending: Family Medicine | Admitting: Family Medicine

## 2013-03-28 DIAGNOSIS — J40 Bronchitis, not specified as acute or chronic: Secondary | ICD-10-CM

## 2013-03-28 MED ORDER — ALBUTEROL SULFATE HFA 108 (90 BASE) MCG/ACT IN AERS: 1.0000 | INHALATION_SPRAY | RESPIRATORY_TRACT | Status: DC | PRN

## 2013-03-28 MED ORDER — ALBUTEROL SULFATE (5 MG/ML) 0.5% IN NEBU
5.0000 mg | INHALATION_SOLUTION | Freq: Once | RESPIRATORY_TRACT | Status: AC
  Administered 2013-03-28: 5 mg via RESPIRATORY_TRACT

## 2013-03-28 MED ORDER — ALBUTEROL SULFATE (5 MG/ML) 0.5% IN NEBU
INHALATION_SOLUTION | RESPIRATORY_TRACT | Status: AC
  Filled 2013-03-28: qty 1

## 2013-03-28 NOTE — ED Provider Notes (Signed)
Medical screening examination/treatment/procedure(s) were performed by resident physician or non-physician practitioner and as supervising physician I was immediately available for consultation/collaboration.   Barkley Bruns MD.   Linna Hoff, MD 03/28/13 2046

## 2013-03-28 NOTE — ED Provider Notes (Signed)
History     CSN: [id number]  Arrival date & time 03/28/13  1630   First MD Initiated Contact with Patient 03/28/13 1904      Chief Complaint  Patient presents with  . URI    (Consider location/radiation/quality/duration/timing/severity/associated sxs/prior treatment) HPI Comments: Pt reports having a cold starting 3 weeks ago, with sore throat, chills, nasal congestion, cough.  All sx have resolved not except cough which is persistent and is causing chest pain when she coughs.  Unable to cough anything up but feels like she has congestion that needs to be coughed up.   Patient is a 67 y.o. female presenting with URI. The history is provided by the patient.  URI Presenting symptoms: congestion and cough   Presenting symptoms: no ear pain, no fever, no rhinorrhea and no sore throat   Severity:  Moderate Onset quality:  Gradual Duration:  3 weeks Timing:  Constant Progression:  Worsening Chronicity:  New Relieved by:  Nothing Worsened by:  Nothing tried Ineffective treatments:  OTC medications Associated symptoms: no headaches, no myalgias, no sinus pain, no swollen glands and no wheezing     Past Medical History  Diagnosis Date  . Diabetes mellitus   . GERD (gastroesophageal reflux disease)   . Hypertension   . HLD (hyperlipidemia)     History reviewed. No pertinent past surgical history.  Family History  Problem Relation Age of Onset  . Stroke Neg Hx   . Heart disease Neg Hx   . Cancer Neg Hx     History  Substance Use Topics  . Smoking status: Never Smoker   . Smokeless tobacco: Not on file  . Alcohol Use: No    OB History   Grav Para Term Preterm Abortions TAB SAB Ect Mult Living                  Review of Systems  Constitutional: Negative for fever and chills.  HENT: Positive for congestion. Negative for ear pain, sore throat, rhinorrhea, postnasal drip and sinus pressure.   Respiratory: Positive for cough and shortness of breath. Negative for  wheezing.        Chest pain with coughing in middle of chest  Musculoskeletal: Negative for myalgias.  Neurological: Negative for headaches.    Allergies  Metformin and Metronidazole  Home Medications   Current Outpatient Rx  Name  Route  Sig  Dispense  Refill  . aspirin 81 MG chewable tablet   Oral   Chew 81 mg by mouth daily.           . hydrochlorothiazide (HYDRODIURIL) 25 MG tablet   Oral   Take 1 tablet (25 mg total) by mouth daily.   30 tablet   11   . simvastatin (ZOCOR) 40 MG tablet      TAKE ONE TABLET BY MOUTH AT BEDTIME   60 tablet   0   . albuterol (PROVENTIL HFA;VENTOLIN HFA) 108 (90 BASE) MCG/ACT inhaler   Inhalation   Inhale 1-2 puffs into the lungs every 4 (four) hours as needed for wheezing or shortness of breath.   1 Inhaler   0     Please dispense with spacer   . glucose blood test strip      Check blood sugar twice a day before breakfast and before dinner   100 each   3   . Lancets (ACCU-CHEK MULTICLIX) lancets   Other   1 each by Other route as needed. Use as instructed  BP 155/76  Pulse 95  Temp(Src) 98.4 F (36.9 C) (Oral)  Resp 14  SpO2 95%  LMP 04/10/1996  Physical Exam  Constitutional: She appears well-developed and well-nourished. No distress.  HENT:  Right Ear: Tympanic membrane, external ear and ear canal normal.  Left Ear: Tympanic membrane, external ear and ear canal normal.  Nose: No mucosal edema or rhinorrhea.  Mouth/Throat: Oropharynx is clear and moist and mucous membranes are normal.  Cardiovascular: Normal rate and regular rhythm.   Pulmonary/Chest: Effort normal and breath sounds normal. She exhibits tenderness.    Coughing frequently  Lymphadenopathy:       Head (right side): No submental, no submandibular and no tonsillar adenopathy present.       Head (left side): No submental, no submandibular and no tonsillar adenopathy present.    She has no cervical adenopathy.    ED Course    Procedures (including critical care time)  Labs Reviewed - No data to display Dg Chest 2 View  03/28/2013   *RADIOLOGY REPORT*  Clinical Data: Cough and congestion for 3 weeks  CHEST - 2 VIEW  Comparison: 09/02/2007 and 06/16/2009  Findings: Heart size is within normal limits. Mediastinal and hilar contours are normal.  There is stable pleuroparenchymal thickening bilaterally.  Pulmonary vascularity is normal.  No airspace disease, effusion, or pneumothorax is identified.  No acute osseous abnormality.  IMPRESSION: No acute cardiopulmonary disease.   Original Report Authenticated By: Britta Mccreedy, M.D.     1. Bronchitis       MDM  Pt given albuterol nebs x1, reports feeling sx relieved, feels like she can breathe better.  Rx albuterol HFA with spacer to use 1-2 puffs every 4-6 hours prn. Pt to f/u with pcp if not improving in 1-2 weeks and decreasing need for inhaler.         Cathlyn Parsons, NP 03/28/13 [phone]

## 2013-03-28 NOTE — ED Notes (Signed)
Pt c/o cough with congestion x 3 weeks. Feels SOB and wheezing after coughing. No hx of respiratory problems. No fever. Runny nose and sinus congestion. Patient is alert and oriented.

## 2013-04-22 ENCOUNTER — Encounter: Payer: Self-pay | Admitting: Dietician

## 2013-05-22 ENCOUNTER — Other Ambulatory Visit: Payer: Self-pay

## 2013-06-25 ENCOUNTER — Other Ambulatory Visit: Payer: Self-pay | Admitting: Internal Medicine

## 2013-06-25 NOTE — Telephone Encounter (Signed)
Needs appt in next 30 days. PCP or long [facility] appt slot. Not seen about one yr.

## 2013-07-22 ENCOUNTER — Encounter: Payer: Self-pay | Admitting: Dietician

## 2013-07-22 ENCOUNTER — Ambulatory Visit (INDEPENDENT_AMBULATORY_CARE_PROVIDER_SITE_OTHER): Payer: BC Managed Care – PPO | Admitting: Internal Medicine

## 2013-07-22 VITALS — BP 137/81 | HR 66 | Temp 97.9°F | Ht 65.0 in | Wt 180.4 lb

## 2013-07-22 DIAGNOSIS — E785 Hyperlipidemia, unspecified: Secondary | ICD-10-CM

## 2013-07-22 DIAGNOSIS — E119 Type 2 diabetes mellitus without complications: Secondary | ICD-10-CM

## 2013-07-22 DIAGNOSIS — Z0000 Encounter for general adult medical examination without abnormal findings: Secondary | ICD-10-CM

## 2013-07-22 DIAGNOSIS — I10 Essential (primary) hypertension: Secondary | ICD-10-CM

## 2013-07-22 DIAGNOSIS — Z299 Encounter for prophylactic measures, unspecified: Secondary | ICD-10-CM

## 2013-07-22 LAB — CBC
HCT: 35.8 % — ABNORMAL LOW (ref 36.0–46.0)
Hemoglobin: 11.8 g/dL — ABNORMAL LOW (ref 12.0–15.0)
MCH: 31 pg (ref 26.0–34.0)
MCV: 94 fL (ref 78.0–100.0)
RBC: 3.81 MIL/uL — ABNORMAL LOW (ref 3.87–5.11)

## 2013-07-22 LAB — COMPLETE METABOLIC PANEL WITH GFR
ALT: 11 U/L (ref 0–35)
AST: 19 U/L (ref 0–37)
Alkaline Phosphatase: 63 U/L (ref 39–117)
Creat: 1 mg/dL (ref 0.50–1.10)
Sodium: 143 mEq/L (ref 135–145)
Total Bilirubin: 0.8 mg/dL (ref 0.3–1.2)
Total Protein: 7 g/dL (ref 6.0–8.3)

## 2013-07-22 LAB — LIPID PANEL
HDL: 64 mg/dL (ref >39)
LDL Cholesterol: 97 mg/dL (ref 0–99)
Total CHOL/HDL Ratio: 2.7 Ratio

## 2013-07-22 LAB — GLUCOSE, CAPILLARY: Glucose-Capillary: 113 mg/dL — ABNORMAL HIGH (ref 70–99)

## 2013-07-22 LAB — HM DIABETES EYE EXAM

## 2013-07-22 MED ORDER — PIOGLITAZONE HCL 30 MG PO TABS: 30.0000 mg | ORAL_TABLET | Freq: Every day | ORAL | Status: DC

## 2013-07-22 MED ORDER — HYDROCHLOROTHIAZIDE 25 MG PO TABS: 25.0000 mg | ORAL_TABLET | Freq: Every day | ORAL | Status: DC

## 2013-07-22 MED ORDER — SIMVASTATIN 40 MG PO TABS: ORAL_TABLET | ORAL | Status: DC

## 2013-07-22 NOTE — Assessment & Plan Note (Signed)
Patient takes pioglitazone 30 mg daily for her diabetes. Her diabetes control is getting better, and she says that she is actively trying to lose weight.   Lab Results  Component Value Date   HGBA1C 6.8 07/22/2013   HGBA1C 6.9 07/10/2012   HGBA1C 7.3 03/20/2012   Lab Results  Component Value Date   MICROALBUR 1.00 07/10/2012   LDLCALC 95 03/20/2012   CREATININE 1.05 01/05/2012   She agreed to meet Lupita Leash Plyler for nutrition counseling and I have put in for a referral. She is motivated to exercise, however her weight seems to have increased since last visit. She denies any visual changes, or disturbances. She got her retinal scanner exam and foot exam today.

## 2013-07-22 NOTE — Progress Notes (Signed)
Subjective:     Patient ID: [name], female   DOB: [birth year], 67 y.o.   MRN: [id number]  HPI [name] is a very pleasant 67 year old lady who comes in for a regular follow up of diabetes and hypertension. She has been generally healthy since her last visit. A new event has been her elder sister being diagnosed of breast carcinoma, end stage and she is stressed about that. For her diabetes, she is on pioglitazone 30 mg daily (allergic to metformin).She check your blood sugars once a week or if she feels weak. For her hypertension, she takes HCTZ25 mg and is well controlled. Takes blood pressure at home, and says its mostly okay. Trying to loose weight.  Hasn't had any intercurrent illnesses.  Denies chest pain, palpitations, shortness of breath, constipation, diarrhea, selling of the feet, headache, visual changes etc She wants a refill on hctz today.   She  has a past medical history of Diabetes mellitus; GERD (gastroesophageal reflux disease); Hypertension; and HLD (hyperlipidemia).  Review of Systems As per HPI    Objective:   Physical Exam General: No acute distress.  HEENT: PERRL, EOMI.   CV: S1S2 RRR, no murmur Lungs: Bilateral Vesicular breath sounds.  Abdomen: Soft, non-tender, benign Neuro: Alert and oriented times 3. No gross deficits seen.   Foot Exam (consider bilaterally):  Skin: peeling, no wounds or calluses. Pedal Edema: none Pedal pulses present.  Deformities: none Motor or sensory deficits: none. Vibration sensation: Intact Ankle jerk: Normal.       Assessment & Plan:     See problem based charting. RTC 6 months.

## 2013-07-22 NOTE — Patient Instructions (Signed)
[name],  Pleasure to see you! Please keep taking your medications like you are doing! Lets meet in 6 months.

## 2013-07-22 NOTE — Assessment & Plan Note (Addendum)
Flu shot and pneumovax given today.  Retinal Scanning done Foot exam done.  CMP, CBC, Lipid Profile done today. Urine Mcralb/Cr ratio done today.

## 2013-07-22 NOTE — Assessment & Plan Note (Signed)
Well controlled on HCTZ 25 mg daily.   BP Readings from Last 3 Encounters:  07/22/13 137/81  03/28/13 155/76  07/10/12 137/68   Would continue same management.

## 2013-08-04 NOTE — Addendum Note (Signed)
Addended by: Bufford Spikes on: 08/04/2013 09:30 AM   Modules accepted: Orders

## 2013-08-04 NOTE — Addendum Note (Signed)
Addended by: Bufford Spikes on: 08/04/2013 09:29 AM   Modules accepted: Orders

## 2013-08-05 ENCOUNTER — Encounter: Payer: Self-pay | Admitting: Internal Medicine

## 2013-08-05 ENCOUNTER — Telehealth: Payer: Self-pay | Admitting: *Deleted

## 2013-08-05 NOTE — Telephone Encounter (Signed)
As i started earlier [facility] calls for an order, pt presented there c/o "lump in breast". They need an order to continue with a DIAGNOSTIC MAMMOGRAM, i will bring you the paperwork

## 2013-08-06 NOTE — Progress Notes (Addendum)
Received colonoscopy report of the patient, done in 2007 (scanned under "Media") with 1 sessile polyp 3 cm long removed with cold snare. Follow up was supposed to be in 5-10 years. I will schedule a follow up for the patient today. To be referred to Dr. Jeani Hawking of [facility] who has seen her in 2007 to schedule a follow up colonoscopy.

## 2013-08-06 NOTE — Addendum Note (Signed)
Addended by: Aletta Edouard on: 08/06/2013 11:18 AM   Modules accepted: Orders

## 2013-08-08 ENCOUNTER — Other Ambulatory Visit: Payer: Self-pay | Admitting: Internal Medicine

## 2013-08-08 ENCOUNTER — Telehealth: Payer: Self-pay | Admitting: Internal Medicine

## 2013-08-08 NOTE — Telephone Encounter (Signed)
Discussed mammogram and ultrasound result with patient. She is relieved that they are negative. She does not want to do any follow up at this time. She declines to go to a gynecologist for further follow up of the lump that she feels. (At present she is telling me that she is not so sure if she is feeling it)

## 2013-09-25 ENCOUNTER — Other Ambulatory Visit: Payer: Self-pay | Admitting: *Deleted

## 2013-09-25 DIAGNOSIS — E119 Type 2 diabetes mellitus without complications: Secondary | ICD-10-CM

## 2013-09-25 MED ORDER — PIOGLITAZONE HCL 30 MG PO TABS: 30.0000 mg | ORAL_TABLET | Freq: Every day | ORAL | Status: DC

## 2013-09-25 NOTE — Telephone Encounter (Signed)
Rx faxed in.

## 2013-10-02 ENCOUNTER — Emergency Department (INDEPENDENT_AMBULATORY_CARE_PROVIDER_SITE_OTHER)
Admission: EM | Admit: 2013-10-02 | Discharge: 2013-10-02 | Disposition: A | Discharge status: Home / Self Care | Payer: Medicare Other | Source: Home / Self Care

## 2013-10-02 ENCOUNTER — Encounter (HOSPITAL_COMMUNITY): Payer: Self-pay | Admitting: Emergency Medicine

## 2013-10-02 DIAGNOSIS — K6289 Other specified diseases of anus and rectum: Secondary | ICD-10-CM

## 2013-10-02 MED ORDER — HYDROCORTISONE ACETATE 25 MG RE SUPP: RECTAL | Status: DC

## 2013-10-02 NOTE — ED Provider Notes (Signed)
CSN: [id number]     Arrival date & time 10/02/13  8295 History   First MD Initiated Contact with Patient 10/02/13 [phone]     Chief Complaint  Patient presents with  . Rectal Pain   (Consider location/radiation/quality/duration/timing/severity/associated sxs/prior Treatment) HPI Comments: And 67 year old female complaining of rectal pain for approximately 6 weeks. Often tends to radiate to her buttocks. She is attributing the pain to sitting in the bathtub when she takes bathes. She does not take showers. The pain is worse when having a bowel movement. Denies constipation or straining. No blood seen while taking a bath or with BM.  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   Past Medical History  Diagnosis Date  . Diabetes mellitus   . GERD (gastroesophageal reflux disease)   . Hypertension   . HLD (hyperlipidemia)    History reviewed. No pertinent past surgical history. Family History  Problem Relation Age of Onset  . Stroke Neg Hx   . Heart disease Neg Hx   . Cancer Neg Hx    History  Substance Use Topics  . Smoking status: Never Smoker   . Smokeless tobacco: Not on file  . Alcohol Use: No   OB History   Grav Para Term Preterm Abortions TAB SAB Ect Mult Living                 Review of Systems  Allergies  Metformin and Metronidazole  Home Medications   Current Outpatient Rx  Name  Route  Sig  Dispense  Refill  . albuterol (PROVENTIL HFA;VENTOLIN HFA) 108 (90 BASE) MCG/ACT inhaler   Inhalation   Inhale 1-2 puffs into the lungs every 4 (four) hours as needed for wheezing or shortness of breath.   1 Inhaler   0     Please dispense with spacer   . aspirin 81 MG chewable tablet   Oral   Chew 81 mg by mouth daily.           Marland Kitchen glucose blood test strip      Check blood sugar twice a day before breakfast and before dinner   100 each   3   . hydrochlorothiazide (HYDRODIURIL) 25 MG tablet   Oral   Take 1 tablet (25 mg total) by mouth daily.  30 tablet   11   . hydrocortisone (ANUSOL-HC) 25 MG suppository      Place 1 supp pr tid   24 suppository   0   . Lancets (ACCU-CHEK MULTICLIX) lancets   Other   1 each by Other route as needed. Use as instructed          . pioglitazone (ACTOS) 30 MG tablet   Oral   Take 1 tablet (30 mg total) by mouth daily.   30 tablet   0   . simvastatin (ZOCOR) 40 MG tablet      TAKE ONE TABLET BY MOUTH AT BEDTIME   90 tablet   3    BP 129/76  Pulse 65  Temp(Src) 98.4 F (36.9 C) (Oral)  Resp 18  SpO2 100%  LMP 04/10/1996 Physical Exam  Nursing note and vitals reviewed. Constitutional: She is oriented to person, place, and time. She appears well-developed and well-nourished. No distress.  Neck: Normal range of motion. Neck supple.  Cardiovascular: Normal rate.   Pulmonary/Chest: Effort normal. No respiratory distress.  Genitourinary:  External to the anus is an old nonobstructing hemorrhoidal tag. No fissures or other lesions present.  DRE reveals nl sphincter tone, no masses or palpable lesions to suggest internal hemorrhoid. Mild to moderate tenderness along the distal rectal mucosa. No bleeding or discharge/drainage.  Neurological: She is alert and oriented to person, place, and time. She exhibits normal muscle tone.  Skin: Skin is warm and dry. No rash noted.  Psychiatric: She has a normal mood and affect.    ED Course  Procedures (including critical care time) Labs Review Labs Reviewed - No data to display Imaging Review No results found.    MDM   1. Rectal or anal pain     Emphasized the need to see PCP and possibly GI for endoscopy ASAP.  For now Anusol HC supp tid  Hayden Rasmussen, NP 10/02/13 1046

## 2013-10-02 NOTE — ED Notes (Signed)
C/o pain in rectal area x 6 weeks

## 2013-10-04 NOTE — ED Provider Notes (Signed)
Medical screening examination/treatment/procedure(s) were performed by a resident physician or non-physician practitioner and as the supervising physician I was immediately available for consultation/collaboration.  Nikash Mortensen, MD    Dillion Stowers S Licia Harl, MD 10/04/13 0837 

## 2013-10-08 ENCOUNTER — Ambulatory Visit (INDEPENDENT_AMBULATORY_CARE_PROVIDER_SITE_OTHER): Payer: BC Managed Care – PPO | Admitting: Internal Medicine

## 2013-10-08 ENCOUNTER — Encounter: Payer: Self-pay | Admitting: Internal Medicine

## 2013-10-08 VITALS — BP 138/72 | HR 76 | Temp 97.0°F | Ht 65.0 in | Wt 183.9 lb

## 2013-10-08 DIAGNOSIS — K6289 Other specified diseases of anus and rectum: Secondary | ICD-10-CM

## 2013-10-08 DIAGNOSIS — N390 Urinary tract infection, site not specified: Secondary | ICD-10-CM

## 2013-10-08 LAB — SEDIMENTATION RATE: Sed Rate: 14 mm/hr (ref 0–22)

## 2013-10-08 LAB — POCT URINALYSIS DIPSTICK
Glucose, UA: NEGATIVE
Ketones, UA: NEGATIVE
Leukocytes, UA: NEGATIVE
Nitrite, UA: NEGATIVE
Protein, UA: NEGATIVE
Spec Grav, UA: 1.025
Urobilinogen, UA: 0.2

## 2013-10-08 LAB — GLUCOSE, CAPILLARY: Glucose-Capillary: 155 mg/dL — ABNORMAL HIGH (ref 70–99)

## 2013-10-08 MED ORDER — NITROGLYCERIN 2 % TD OINT: 0.5000 [in_us] | TOPICAL_OINTMENT | Freq: Four times a day (QID) | TRANSDERMAL | Status: DC

## 2013-10-08 NOTE — Patient Instructions (Signed)
We will like top see you in 2 weeks. Please schedule your colonoscopy. We will also do some blood work. When we see you all your result should be ready, then we will be in a better position to determine what is going on.  For now we will prescribe something for you that should help with the pain you are having.  Please continue with all your other medications as prescribed.

## 2013-10-08 NOTE — Assessment & Plan Note (Addendum)
One month Hx, related only to defecation, each episode lasting about . Considerations are for internal hemorrhoids, anal fissures, and protalgia Fugax, but the the later is a diagnosis of exclusion. No redness or fluctuance to suggest presence of an infection or abscess. Considering last colonoscopy which was in 2007- findings of sessile polyp which was benign, unlikley Rectal malig/ca.   Plan- Repeat Colonoscopy. - ESR, CRP, Anemia Panel- ( Pt last Hb was- 11.8, 2 months ago). - Topical Nitroglycerin ointment- To help with spasm. - Reaccess when results are in.

## 2013-10-08 NOTE — Progress Notes (Signed)
Patient ID: [name], female   DOB: [birth year], 67 y.o.   MRN: [id number]   Subjective:   Patient ID: [name] female   DOB: 03-Sep-1946 67 y.o.   MRN: [id number]  HPI: [name] is a 47 y.o. with PMH of HTN, GERD, DM2, HLD.   Presented today with complaints of rectal pain, present for the past month, only when she wants to pass stool. Describes it as a burning sensation that spreads around  Her buttock region. Each epiosode last about and resolves spontaneously and completely. No previous episodes, Average bowel movement is 1ce a day, no hx of constipation, no diarrhea or blood in stools, No fever, no weightloss, no falls, no vaginal discharge or abdominal pain, pt is not sexually active..  Last colonoscopy was doen- 2007, which showed some internal ghemorrhoids. Pt presented to 2 different urgent carer centers for same complanits but nothing concerning was detected or suspected so she was discharged home to use a HC rectal suppository which she couldn't get refilled, as she was told it had to be orded and to follow up with her PCP.   Past Medical History  Diagnosis Date  . Diabetes mellitus   . GERD (gastroesophageal reflux disease)   . Hypertension   . HLD (hyperlipidemia)    Current Outpatient Prescriptions  Medication Sig Dispense Refill  . albuterol (PROVENTIL HFA;VENTOLIN HFA) 108 (90 BASE) MCG/ACT inhaler Inhale 1-2 puffs into the lungs every 4 (four) hours as needed for wheezing or shortness of breath.  1 Inhaler  0  . aspirin 81 MG chewable tablet Chew 81 mg by mouth daily.        Marland Kitchen glucose blood test strip Check blood sugar twice a day before breakfast and before dinner  100 each  3  . hydrochlorothiazide (HYDRODIURIL) 25 MG tablet Take 1 tablet (25 mg total) by mouth daily.  30 tablet  11  . hydrocortisone (ANUSOL-HC) 25 MG suppository Place 1 supp pr tid  24 suppository  0  . Lancets (ACCU-CHEK MULTICLIX) lancets 1 each by Other route as needed. Use as  instructed       . nitroGLYCERIN (NITROGLYN) 2 % ointment Apply 0.5 inches topically every 6 (six) hours.  30 g  1  . pioglitazone (ACTOS) 30 MG tablet Take 1 tablet (30 mg total) by mouth daily.  30 tablet  0  . simvastatin (ZOCOR) 40 MG tablet TAKE ONE TABLET BY MOUTH AT BEDTIME  90 tablet  3   No current facility-administered medications for this visit.   Family History  Problem Relation Age of Onset  . Stroke Neg Hx   . Heart disease Neg Hx   . Cancer Neg Hx    History   Social History  . Marital Status: Single    Spouse Name: N/A    Number of Children: N/A  . Years of Education: 10   Occupational History  .  [employer]   Social History Main Topics  . Smoking status: Never Smoker   . Smokeless tobacco: None  . Alcohol Use: No  . Drug Use: No  . Sexual Activity: Yes    Birth Control/ Protection: None   Other Topics Concern  . None   Social History Narrative  . None   Review of Systems: CONSTITUTIONAL- No Fever, weightloss, night sweat, or change appetite. SKIN- No Rash, colour changes,or itching. HEAD- No Headache, or dizziness. RESPIRATORY- No Cough, or SOB. CARDIAC- No Palpitations, or chest pain. GI-  No vomiting, diarrhoea, constipation, or abd pain. URINARY- No Frequency, or polyuria. Or dysuria. NEUROLOGIC- No Numbness, syncope, burning.   Objective:  Physical Exam: Filed Vitals:   10/08/13 0914  BP: 138/72  Pulse: 76  Temp: 97 F (36.1 C)  TempSrc: Oral  Height: 5\' 5"  (1.651 m)  Weight: 183 lb 14.4 oz (83.416 kg)  SpO2: 100%   GENERAL- alert, co-operative, appears as stated age, not in any distress. HEENT- Atraumatic, normocephalic, PERRL, EOMI, oral mucosa appears moist, no cervical LN enlargement, thyroid does not appear enlarged. CARDIAC- RRR, no murmurs, rubs or gallops. RESP- Moving equal volumes of air, and clear to auscultation bilaterally. ABDOMEN- Soft,non tender, bowel sounds present. RECTAL EXAM- Old appearing  hemorrhoid-almost like a skin tag, about 1.5 by 1.5cm at 6 o clock position, no thrombosed or strangulated, non tender, normal skin appearance, no ulcers, normal rectal tone, no palpable massess, or fissures palpated, rectal exam did not provoke pain, rectal pouch is empty. BACK- Normal curvature of the spine, No tenderness along the vertebrae, no CVA tenderness. NEURO- No obvious Cr N abnormality,  strenght equal and present in all extremities. EXTREMITIES- pulse 2+, symmetric, no pedal edema.Marland Kitchen SKIN- Warm, dry, No rash or lesion. PSYCH- Normal mood and affect, appropriate thought content and speech.  Assessment & Plan:   The patient's case and plan of care was discussed with attending physician, Dr. Irine Seal.  Please see problem based chatting for assessment and plan.

## 2013-10-08 NOTE — Progress Notes (Signed)
I saw and evaluated the patient.  I personally confirmed the key portions of the history and exam documented by Dr. Emokpae and I reviewed pertinent patient test results.  The assessment, diagnosis, and plan were formulated together and I agree with the documentation in the resident's note. 

## 2013-10-08 NOTE — Addendum Note (Signed)
Addended by: Blanch Media A on: 10/08/2013 04:38 PM   Modules accepted: Level of Service

## 2013-10-09 LAB — ANEMIA PANEL
%SAT: 19 % — ABNORMAL LOW (ref 20–55)
ABS Retic: 60.5 10*3/uL (ref 19.0–186.0)
Iron: 65 ug/dL (ref 42–145)
TIBC: 341 ug/dL (ref 250–470)
Vitamin B-12: 407 pg/mL (ref 211–911)

## 2013-10-09 LAB — C-REACTIVE PROTEIN: CRP: <0.5 mg/dL (ref <0.60)

## 2013-10-14 NOTE — Addendum Note (Signed)
Addended by: Onnie Boer on: 10/14/2013 03:24 PM   Modules accepted: Orders

## 2013-10-27 ENCOUNTER — Encounter: Payer: Self-pay | Admitting: Internal Medicine

## 2013-10-27 ENCOUNTER — Ambulatory Visit (INDEPENDENT_AMBULATORY_CARE_PROVIDER_SITE_OTHER): Payer: BC Managed Care – PPO | Admitting: Internal Medicine

## 2013-10-27 VITALS — BP 122/65 | HR 69 | Temp 98.2°F | Ht 65.0 in | Wt 181.1 lb

## 2013-10-27 DIAGNOSIS — E119 Type 2 diabetes mellitus without complications: Secondary | ICD-10-CM

## 2013-10-27 DIAGNOSIS — K6289 Other specified diseases of anus and rectum: Secondary | ICD-10-CM

## 2013-10-27 NOTE — Patient Instructions (Signed)
1. Please re schedule your colonoscopy as instructed. 2. Please continue the current regimen.  3. Follow up in 3 months as needed.

## 2013-10-28 LAB — MICROALBUMIN / CREATININE URINE RATIO
Creatinine, Urine: 238.1 mg/dL
Microalb Creat Ratio: 3.1 mg/g (ref 0.0–30.0)
Microalb, Ur: 0.73 mg/dL (ref 0.00–1.89)

## 2013-10-28 NOTE — Assessment & Plan Note (Addendum)
Lab Results  Component Value Date   HGBA1C 6.5 10/27/2013   HGBA1C 6.8 07/22/2013   HGBA1C 6.9 07/10/2012     Assessment: Diabetes control: good control (HgbA1C at goal) Progress toward A1C goal:  at goal  Plan: Medications:  continue current medications Home glucose monitoring: Frequency:   Timing:   Instruction/counseling given: reminded to get eye exam, reminded to bring blood glucose meter & log to each visit, reminded to bring medications to each visit and discussed foot care Educational resources provided:   Self management tools provided:   Will check her urine microalbumin

## 2013-10-28 NOTE — Progress Notes (Signed)
Patient: [name]   MRN: [id number]  DOB: 1946/04/23  PCP: Aletta Edouard, MD   Subjective:    CC: Rectal Problems follow up.    HPI: [name] is a 67 y.o. female with a PMHx of HTN, HLD, T2DM, GERD, who presented to clinic today for the followup visit from last OV.  Patient was evaluated rectal burning pain with defecation at the clinic on 10/08/2013.  The etiology of wasn't thought to be related to possible rectal internal hemorrhoids, anal fissures and protalgia Fugax.  She was given topical nitroglycerin ointment and a repeat colonoscopy was recommended.  She says she's been compliant with topical nitroglycerin ointment since last office visit, and reports that she feels much better.  She's here for followup.  Of note, she also reports intermittent aching around the left buttock and posterior thigh area.  Admits of having a habit sitting on toilet seat for about 15-20 minutes whenever she uses bathroom.  Also admits that she used a new bathtub mat that she thinks is the reason for her buttock aching.  She states that she has no aching/pain since she threw it away.   Denies back pain or sciatica pain.  Denies trauma or injury.  Denies fever, chills, shortness of breath, chest pain/pressure, nausea, vomiting, abdomen pain, constipation/diarrhea, weakness, numbness or tingling.  Denies incontinence of urine or bowel. She states that she does not have any pain today.  Review of Systems: Per HPI.   Current Outpatient Medications: Current Outpatient Prescriptions  Medication Sig Dispense Refill  . albuterol (PROVENTIL HFA;VENTOLIN HFA) 108 (90 BASE) MCG/ACT inhaler Inhale 1-2 puffs into the lungs every 4 (four) hours as needed for wheezing or shortness of breath.  1 Inhaler  0  . aspirin 81 MG chewable tablet Chew 81 mg by mouth daily.        Marland Kitchen glucose blood test strip Check blood sugar twice a day before breakfast and before dinner  100 each  3  . hydrochlorothiazide  (HYDRODIURIL) 25 MG tablet Take 1 tablet (25 mg total) by mouth daily.  30 tablet  11  . hydrocortisone (ANUSOL-HC) 25 MG suppository Place 1 supp pr tid  24 suppository  0  . Lancets (ACCU-CHEK MULTICLIX) lancets 1 each by Other route as needed. Use as instructed       . nitroGLYCERIN (NITROGLYN) 2 % ointment Apply 0.5 inches topically every 6 (six) hours.  30 g  1  . pioglitazone (ACTOS) 30 MG tablet Take 1 tablet (30 mg total) by mouth daily.  30 tablet  0  . simvastatin (ZOCOR) 40 MG tablet TAKE ONE TABLET BY MOUTH AT BEDTIME  90 tablet  3   No current facility-administered medications for this visit.    Allergies: Allergies  Allergen Reactions  . Metformin     REACTION: Nausea/Diarrhea, feeling very sick, missing work  . Metronidazole     REACTION: bad dreams, drunk feeling, seeing things    Past Medical History  Diagnosis Date  . Diabetes mellitus   . GERD (gastroesophageal reflux disease)   . Hypertension   . HLD (hyperlipidemia)     Objective:    Physical Exam: Filed Vitals:   10/27/13 1101  BP: 122/65  Pulse: 69  Temp: 98.2 F (36.8 C)  TempSrc: Oral  Height: 5\' 5"  (1.651 m)  Weight: 181 lb 1.6 oz (82.146 kg)  SpO2: 99%     General: Vital signs reviewed and noted. Well-developed, well-nourished, in no acute distress;  alert, appropriate and cooperative throughout examination.  Head: Normocephalic, atraumatic.  Lungs:  Normal respiratory effort. Clear to auscultation BL without crackles or wheezes.  Heart: RRR. S1 and S2 normal without gallop, rubs, murmur.  Abdomen:  BS normoactive. Soft, Nondistended, non-tender.  No masses or organomegaly.  Extremities: No pretibial edema.  No paraspinal, buttock or posterior thigh erythema, tenderness, swelling or warmth to touch.  Neuro exam grossly intact.    Assessment/ Plan:

## 2013-10-28 NOTE — Assessment & Plan Note (Signed)
Assessment Patient is here for followup since last office visit on 11/26//2014.  She reports medical compliance with topical nitroglycerin ointment and she feels better.  Of note, the possible etiology of intermittent aching around the left buttock and the posterior thigh area is unclear.  She does not have aching/pain today.  No focal neuro deficit.  It may associated with the fact that she has a habit of sitting on toilet seat for 15-20 minutes.  Plan - Increase patient to continue take nitroglycerin topical cream - She has contacted her GI doctor for repeat colonoscopy and told me she will have it at the beginning of January, 16109. - Detailed instruction on toilet hygiene given to patient.  The patient is instructed to not sit on toilet seat for too long. And avoid eating and playing electronic device while using the bathroom. - Patient understands and agrees with above plan

## 2013-10-29 NOTE — Progress Notes (Signed)
Case discussed with Dr. Li at the time of the visit.  We reviewed the resident's history and exam and pertinent patient test results.  I agree with the assessment, diagnosis, and plan of care documented in the resident's note. 

## 2013-12-12 ENCOUNTER — Other Ambulatory Visit: Payer: Self-pay | Admitting: Internal Medicine

## 2014-01-20 ENCOUNTER — Encounter: Payer: Self-pay | Admitting: Internal Medicine

## 2014-01-20 ENCOUNTER — Ambulatory Visit (INDEPENDENT_AMBULATORY_CARE_PROVIDER_SITE_OTHER): Payer: BC Managed Care – PPO | Admitting: Internal Medicine

## 2014-01-20 VITALS — BP 111/66 | HR 71 | Temp 97.3°F | Ht 65.0 in | Wt 184.8 lb

## 2014-01-20 DIAGNOSIS — E119 Type 2 diabetes mellitus without complications: Secondary | ICD-10-CM

## 2014-01-20 DIAGNOSIS — E785 Hyperlipidemia, unspecified: Secondary | ICD-10-CM

## 2014-01-20 DIAGNOSIS — I10 Essential (primary) hypertension: Secondary | ICD-10-CM

## 2014-01-20 LAB — POCT GLYCOSYLATED HEMOGLOBIN (HGB A1C): HEMOGLOBIN A1C: 6.6

## 2014-01-20 LAB — GLUCOSE, CAPILLARY: GLUCOSE-CAPILLARY: 117 mg/dL — AB (ref 70–99)

## 2014-01-20 MED ORDER — PIOGLITAZONE HCL 30 MG PO TABS: ORAL_TABLET | ORAL | Status: DC

## 2014-01-20 MED ORDER — HYDROCHLOROTHIAZIDE 25 MG PO TABS: 25.0000 mg | ORAL_TABLET | Freq: Every day | ORAL | Status: DC

## 2014-01-20 MED ORDER — SIMVASTATIN 40 MG PO TABS: ORAL_TABLET | ORAL | Status: DC

## 2014-01-20 NOTE — Patient Instructions (Addendum)
[name], [name] are doing well! Keep taking your medicines like you are. I have given you refills. We talked about colonoscopy and you will call [facility]. Let us meet in 6 months!  Thanks, Madilyn Fireman MD MPH 01/20/2014 11:18 AM

## 2014-01-20 NOTE — Progress Notes (Signed)
Subjective:     Patient ID: [name], female   DOB: [birth year], 68 y.o.   MRN: [id number]  HPI Ms Rapaport follows up for hypertension, diabetes and hemorrhoids. She reports compliance with all her medications, and wants some refills. She has big relief in her hemorrhoid pain with nitroglycerin ointment, and she is not using any treatment any more for that. She does not check her blood pressure or blood sugar at home. She denies any other complaints. Her 4 point review of symptoms is negative.    Review of Systems  Constitutional: Negative.   Respiratory: Negative.   Cardiovascular: Negative.   Gastrointestinal: Negative.        Objective:   Physical Exam General: No acute distress.  HEENT: PERRL, EOMI.   CV: S1S2 RRR, no murmur Lungs: Bilateral Vesicular breath sounds.  Abdomen: Soft, non-tender, benign Pedal Edema: none Pedal pulses present.  Neuro: Alert and oriented times 3. No gross deficits seen.      Assessment & Plan     No change in therapy made at this time. Patient is doing well and will continue all her medications. Refills given. Patient is up to date on health maintenance. She is unsure when her last colonoscopy was done - I have documentation from 2007 - I will try to search more thoroughly for other documentation. However, the patient herself plans to call [facility] to find out when she needs to come in. The patient had a mammogram done in 9/14 at [facility] and the report we have says "Additional Imaging required". We called [facility] and they reported that USG was done following MM and it was benign. However, we have asked them to fax [place] a copy of the USG. Patient will come to see me in 6 months from now.

## 2014-08-05 ENCOUNTER — Encounter: Payer: Self-pay | Admitting: Internal Medicine

## 2014-08-05 ENCOUNTER — Ambulatory Visit (INDEPENDENT_AMBULATORY_CARE_PROVIDER_SITE_OTHER): Payer: BC Managed Care – PPO | Admitting: Internal Medicine

## 2014-08-05 VITALS — BP 145/68 | HR 66 | Temp 98.1°F | Ht 65.0 in | Wt 186.2 lb

## 2014-08-05 DIAGNOSIS — Z0000 Encounter for general adult medical examination without abnormal findings: Secondary | ICD-10-CM

## 2014-08-05 DIAGNOSIS — I10 Essential (primary) hypertension: Secondary | ICD-10-CM | POA: Diagnosis not present

## 2014-08-05 DIAGNOSIS — E119 Type 2 diabetes mellitus without complications: Secondary | ICD-10-CM | POA: Diagnosis not present

## 2014-08-05 DIAGNOSIS — Z23 Encounter for immunization: Secondary | ICD-10-CM

## 2014-08-05 LAB — COMPLETE METABOLIC PANEL WITH GFR
ALK PHOS: 61 U/L (ref 39–117)
ALT: 11 U/L (ref 0–35)
AST: 20 U/L (ref 0–37)
Albumin: 4 g/dL (ref 3.5–5.2)
BILIRUBIN TOTAL: 0.7 mg/dL (ref 0.2–1.2)
BUN: 21 mg/dL (ref 6–23)
CO2: 26 mEq/L (ref 19–32)
Calcium: 9.6 mg/dL (ref 8.4–10.5)
Chloride: 102 mEq/L (ref 96–112)
Creat: 1.04 mg/dL (ref 0.50–1.10)
GFR, EST NON AFRICAN AMERICAN: 55 mL/min — AB
GFR, Est African American: 64 mL/min
Glucose, Bld: 116 mg/dL — ABNORMAL HIGH (ref 70–99)
Potassium: 4.2 mEq/L (ref 3.5–5.3)
SODIUM: 140 meq/L (ref 135–145)
TOTAL PROTEIN: 7.3 g/dL (ref 6.0–8.3)

## 2014-08-05 NOTE — Patient Instructions (Signed)
General Instructions: We will run some labs on you today. For the pain you can try Ibuprofen.     Please bring your medicines with you each time you come to clinic.  Medicines may include prescription medications, over-the-counter medications, herbal remedies, eye drops, vitamins, or other pills.

## 2014-08-05 NOTE — Assessment & Plan Note (Signed)
BP Readings from Last 3 Encounters:  08/05/14 145/68  01/20/14 111/66  10/27/13 122/65    Lab Results  Component Value Date   NA 143 07/22/2013   K 3.7 07/22/2013   CREATININE 1.00 07/22/2013    Assessment: Blood pressure control:  Controlled Progress toward BP goal:   At goal Comments: Compliant with HCTZ- 25mg  daily.  Plan: Medications:  continue current medications Educational resources provided:   Self management tools provided:   Other plans: CMET with GFR today.

## 2014-08-05 NOTE — Assessment & Plan Note (Signed)
Flu shot today. Colonoscopy next month.

## 2014-08-05 NOTE — Progress Notes (Signed)
Medicine attending: Medical history, presenting problems, physical findings, and medications, discussed with resident physician Dr. Denton Brick and I concur with her evaluation and management plan. Murriel Hopper, M.D., [place]

## 2014-08-05 NOTE — Assessment & Plan Note (Signed)
Controlled with diet. HgbA1c- 6.6- 01/20/2014. Hgba1c- next visit.

## 2014-08-05 NOTE — Progress Notes (Signed)
Patient ID: [name], female   DOB: 08/02/1946, 68 y.o.   MRN: [id number]   Subjective:   Patient ID: [name] female   DOB: 1946-03-20 68 y.o.   MRN: [id number]  HPI: [name] is a 68 y.o. with PMH stated below. Pt presented today with complaints of bilateral buttock and thigh pain extending posteriorly to lower thigh region. Pain is not related to exercise or rest. No known aggrav or relieving factors. Pain is described as burning and has been intermittent  over the past week. Pt has 2 episodes which she feels were severe. Pt is also complaining of dark blood in stools, minimal quantity, occasional, painless. Has a hx of internal and external hemorrhoids seen on Colonoscopy- 2007.   Past Medical History  Diagnosis Date  . Diabetes mellitus   . GERD (gastroesophageal reflux disease)   . Hypertension   . HLD (hyperlipidemia)    Current Outpatient Prescriptions  Medication Sig Dispense Refill  . albuterol (PROVENTIL HFA;VENTOLIN HFA) 108 (90 BASE) MCG/ACT inhaler Inhale 1-2 puffs into the lungs every 4 (four) hours as needed for wheezing or shortness of breath.  1 Inhaler  0  . aspirin 81 MG chewable tablet Chew 81 mg by mouth daily.        Marland Kitchen glucose blood test strip Check blood sugar twice a day before breakfast and before dinner  100 each  3  . hydrochlorothiazide (HYDRODIURIL) 25 MG tablet Take 1 tablet (25 mg total) by mouth daily.  30 tablet  11  . hydrocortisone (ANUSOL-HC) 25 MG suppository Place 1 supp pr tid  24 suppository  0  . Lancets (ACCU-CHEK MULTICLIX) lancets 1 each by Other route as needed. Use as instructed       . nitroGLYCERIN (NITROGLYN) 2 % ointment Apply 0.5 inches topically every 6 (six) hours.  30 g  1  . pioglitazone (ACTOS) 30 MG tablet TAKE 1 TABLET BY MOUTH EVERY DAY  30 tablet  11  . simvastatin (ZOCOR) 40 MG tablet TAKE ONE TABLET BY MOUTH AT BEDTIME  90 tablet  3   No current facility-administered medications for this visit.   Family History   Problem Relation Age of Onset  . Stroke Neg Hx   . Heart disease Neg Hx   . Cancer Neg Hx    History   Social History  . Marital Status: Single    Spouse Name: N/A    Number of Children: N/A  . Years of Education: 10   Occupational History  .  [place] History Main Topics  . Smoking status: Never Smoker   . Smokeless tobacco: None  . Alcohol Use: No  . Drug Use: No  . Sexual Activity: Yes    Birth Control/ Protection: None   Other Topics Concern  . None   Social History Narrative  . None   Review of Systems: CONSTITUTIONAL- No Fever, weightloss, night sweat or change in appetite. SKIN- No Rash, colour changes or itching. HEAD- No Headache or dizziness. EYES- No Vision loss, pain, redness, double or blurred vision. RESPIRATORY- No Cough or SOB. CARDIAC- No Palpitations, DOE, PND or chest pain. GI- No nausea, vomiting, diarrhoea, constipation, abd pain. URINARY- No Frequency, urgency, straining or dysuria. NEUROLOGIC- No Numbness, syncope, seizures or burning. [facility]- Denies depression or anxiety.  Objective:  Physical Exam: Filed Vitals:   08/05/14 1028  BP: 145/68  Pulse: 66  Temp: 98.1 F (36.7 C)  TempSrc: Oral  Height:  5\' 5"  (1.651 m)  Weight: 186 lb 3.2 oz (84.46 kg)  SpO2: 99%   GENERAL- alert, co-operative, appears as stated age, not in any distress. HEENT- Atraumatic, normocephalic, PERRL, EOMI, oral mucosa appears moist, neck supple. CARDIAC- RRR, no murmurs, rubs or gallops. RESP- Moving equal volumes of air, and clear to auscultation bilaterally, no wheezes or crackles. ABDOMEN- Soft, nontender, no palpable masses or organomegaly, bowel sounds present. Rectal exam- hemorrhoid at 6 0 clock, normal sphincter tone, no masses in rectum, no identifiable fissures, rectal vault empty, gloved finger stained with [place] stool, no blood.  NEURO- No obvious Cr N abnormality, strenght upper and lower extremities- intact, Gait-  Normal. EXTREMITIES- No pedal edema, dorsalis pedis pulse intact- bilat- 2+, no skin changes, no ulcers, no nail changes. No tenderness to palpation of buttock of upper thigh area. SKIN- Warm, dry, No rash or lesion. PSYCH- Normal mood and affect, appropriate thought content and speech.  Assessment & Plan:   The patient's case and plan of care was discussed with attending physician, Dr. Beryle Beams.  Assessment- Pt pain most likely musculoskeletal. Unlikely intermittent- thigh and buttock claudication, with pain not related to rest or activity, no skin changes, good DP pulses bilaterally, no hx of smoking and LDL- 07/2013- 97. ABIs therefore not ordered.  Plan- Ibuprofen- 400mg  TID PRN.  Please see problem based charting for assessment and plan.

## 2014-08-09 ENCOUNTER — Encounter (HOSPITAL_COMMUNITY): Payer: Self-pay | Admitting: Emergency Medicine

## 2014-08-09 ENCOUNTER — Emergency Department (INDEPENDENT_AMBULATORY_CARE_PROVIDER_SITE_OTHER)
Admission: EM | Admit: 2014-08-09 | Discharge: 2014-08-09 | Disposition: A | Discharge status: Home / Self Care | Payer: BC Managed Care – PPO | Source: Home / Self Care | Attending: Emergency Medicine | Admitting: Emergency Medicine

## 2014-08-09 DIAGNOSIS — M79609 Pain in unspecified limb: Secondary | ICD-10-CM

## 2014-08-09 DIAGNOSIS — K6289 Other specified diseases of anus and rectum: Secondary | ICD-10-CM

## 2014-08-09 DIAGNOSIS — M79606 Pain in leg, unspecified: Secondary | ICD-10-CM

## 2014-08-09 MED ORDER — NAPROXEN 500 MG PO TABS: 500.0000 mg | ORAL_TABLET | Freq: Two times a day (BID) | ORAL | Status: DC

## 2014-08-09 NOTE — Discharge Instructions (Signed)

## 2014-08-09 NOTE — ED Provider Notes (Signed)
CSN: [id number]     Arrival date & time 08/09/14  1616 History   First MD Initiated Contact with Patient 08/09/14 1628     Chief Complaint  Patient presents with  . Leg Pain   (Consider location/radiation/quality/duration/timing/severity/associated sxs/prior Treatment) HPI   68 year old female presents complaining of bilateral hamstring pain for about one week and rectal pain with having a bowel movement. She was seen by her primary care doctor and was referred for colonoscopy and prescribed ibuprofen for the pain. She did not listen to anything they said so she did not take it Profen and did not know about her colonoscopy. She is here because she says they told her nothing is wrong but she thinks something is wrong. The pain has not changed and she was seen here the other day. She has rectal burning with having a bowel movement. She denies any blood in her bowel movements. She had a rectal exam done by her primary care doctor and was told that she did not have hemorrhoids. He says she does not want to have a rectal exam and does not want me to even look at her but she want me to tell her what is wrong.  Past Medical History  Diagnosis Date  . Diabetes mellitus   . GERD (gastroesophageal reflux disease)   . Hypertension   . HLD (hyperlipidemia)    History reviewed. No pertinent past surgical history. Family History  Problem Relation Age of Onset  . Stroke Neg Hx   . Heart disease Neg Hx   . Cancer Neg Hx    History  Substance Use Topics  . Smoking status: Never Smoker   . Smokeless tobacco: Not on file  . Alcohol Use: No   OB History   Grav Para Term Preterm Abortions TAB SAB Ect Mult Living                 Review of Systems  Constitutional: Negative for fever and unexpected weight change.  Gastrointestinal: Positive for rectal pain. Negative for nausea, vomiting, abdominal pain, diarrhea, blood in stool and anal bleeding.  Musculoskeletal:       Bilateral superior posterior  leg pain  Neurological: Negative for weakness and numbness.  All other systems reviewed and are negative.   Allergies  Metformin and Metronidazole  Home Medications   Prior to Admission medications   Medication Sig Start Date End Date Taking? Authorizing Provider  albuterol (PROVENTIL HFA;VENTOLIN HFA) 108 (90 BASE) MCG/ACT inhaler Inhale 1-2 puffs into the lungs every 4 (four) hours as needed for wheezing or shortness of breath. 03/28/13   Carvel Getting, NP  aspirin 81 MG chewable tablet Chew 81 mg by mouth daily.      Historical Provider, MD  glucose blood test strip Check blood sugar twice a day before breakfast and before dinner 03/20/12   Hans Eden, MD  hydrochlorothiazide (HYDRODIURIL) 25 MG tablet Take 1 tablet (25 mg total) by mouth daily. 01/20/14   Madilyn Fireman, MD  hydrocortisone (ANUSOL-HC) 25 MG suppository Place 1 supp pr tid 10/02/13   Janne Napoleon, NP  Lancets (ACCU-CHEK MULTICLIX) lancets 1 each by Other route as needed. Use as instructed     Historical Provider, MD  naproxen (NAPROSYN) 500 MG tablet Take 1 tablet (500 mg total) by mouth 2 (two) times daily. 08/09/14   Freeman Caldron Abrar Koone, PA-C  nitroGLYCERIN (NITROGLYN) 2 % ointment Apply 0.5 inches topically every 6 (six) hours. 10/08/13 10/08/14  Ejiroghene Arlyce Dice, MD  pioglitazone (ACTOS) 30 MG tablet TAKE 1 TABLET BY MOUTH EVERY DAY 01/20/14   Madilyn Fireman, MD  simvastatin (ZOCOR) 40 MG tablet TAKE ONE TABLET BY MOUTH AT BEDTIME 01/20/14   Madilyn Fireman, MD   BP 129/79  Pulse 68  Temp(Src) 99.1 F (37.3 C) (Oral)  Resp 12  SpO2 100%  LMP 04/10/1996 Physical Exam  Nursing note and vitals reviewed. Constitutional: She is oriented to person, place, and time. Vital signs are normal. She appears well-developed and well-nourished. No distress.  HENT:  Head: Normocephalic and atraumatic.  Pulmonary/Chest: Effort normal. No respiratory distress.  Abdominal:  Declines abdominal exam  Genitourinary:  Declines  rectal exam  Musculoskeletal:       Lumbar back: Normal.       Right upper leg: She exhibits tenderness (And we'll hamstring tightness and tenderness).       Left upper leg: She exhibits tenderness (minimal hamstring tightness and tenderness).  Neurological: She is alert and oriented to person, place, and time. She has normal strength. Coordination normal.  Skin: Skin is warm and dry. No rash noted. She is not diaphoretic.  Psychiatric: She has a normal mood and affect. Judgment normal.    ED Course  Procedures (including critical care time) Labs Review Labs Reviewed - No data to display  Imaging Review No results found.   MDM   1. Pain of lower extremity, unspecified laterality   2. Rectal pain    The patient is not on any sort of exam. She submits to having a look at her legs, she has mild tenderness in her hamstrings. She did not understand the concept of over-the-counter medicines so I will prescribe her some naproxen for musculoskeletal pain. She has been referred for colonoscopy but she is very worried so I will give her a referral for gastroenterology so she may hopefully be seen sooner. Followup with primary care.   Meds ordered this encounter  Medications  . naproxen (NAPROSYN) 500 MG tablet    Sig: Take 1 tablet (500 mg total) by mouth 2 (two) times daily.    Dispense:  30 tablet    Refill:  0    Order Specific Question:  Supervising Provider    Answer:  Jake Michaelis, DAVID C [[id number]]       Liam Graham, PA-C 08/09/14 1655

## 2014-08-09 NOTE — ED Notes (Signed)
Pt   Reports        Pain in  Both  Upper  Thighs     She  Reports  It is  Only  After a  Bowel  Movement      She  denys  Any  specefic  Injury  She  denys       Any  Constipation              She  Reports  Treated  In not  So  Distant  Past  For  A  Urinary  Tract  Infection         Ambulated  To  Room  With  A  Steady    Fluid  Gait        Sitting upright on  Exam table  Speaking in  complete  sentances

## 2014-08-09 NOTE — ED Provider Notes (Signed)
Medical screening examination/treatment/procedure(s) were performed by non-physician practitioner and as supervising physician I was immediately available for consultation/collaboration.  Philipp Deputy, M.D.  Harden Mo, MD 08/09/14 [phone]

## 2014-08-19 ENCOUNTER — Encounter: Payer: Self-pay | Admitting: *Deleted

## 2015-01-11 ENCOUNTER — Encounter: Payer: Self-pay | Admitting: Internal Medicine

## 2015-01-14 DIAGNOSIS — Q308 Other congenital malformations of nose: Secondary | ICD-10-CM | POA: Diagnosis not present

## 2015-01-14 DIAGNOSIS — J40 Bronchitis, not specified as acute or chronic: Secondary | ICD-10-CM | POA: Diagnosis not present

## 2015-02-02 ENCOUNTER — Other Ambulatory Visit: Payer: Self-pay | Admitting: Internal Medicine

## 2015-02-19 ENCOUNTER — Other Ambulatory Visit: Payer: Self-pay | Admitting: Internal Medicine

## 2015-03-15 ENCOUNTER — Telehealth: Payer: Self-pay | Admitting: Internal Medicine

## 2015-03-15 NOTE — Telephone Encounter (Signed)
Call to patient to confirm appointment for 03/16/15 at 9:45 phone is disconnected

## 2015-03-16 ENCOUNTER — Other Ambulatory Visit: Payer: Self-pay | Admitting: Dietician

## 2015-03-16 ENCOUNTER — Ambulatory Visit: Payer: BC Managed Care – PPO | Admitting: Internal Medicine

## 2015-03-16 ENCOUNTER — Encounter: Payer: Self-pay | Admitting: Dietician

## 2015-03-16 ENCOUNTER — Ambulatory Visit: Payer: BC Managed Care – PPO | Admitting: Dietician

## 2015-03-16 DIAGNOSIS — E119 Type 2 diabetes mellitus without complications: Secondary | ICD-10-CM

## 2015-03-16 LAB — HM DIABETES EYE EXAM

## 2015-03-16 NOTE — Progress Notes (Signed)
Retinal images done and transmitted today. Patient also asking many diabetes meal planning questions. Encouraged her to set up a visit with diabetes educator same day as next doctor appointment

## 2015-03-22 ENCOUNTER — Encounter: Payer: Self-pay | Admitting: *Deleted

## 2015-04-13 ENCOUNTER — Other Ambulatory Visit: Payer: Self-pay | Admitting: Internal Medicine

## 2015-04-13 NOTE — Telephone Encounter (Signed)
Last seen sept. Needs appt. PCP next 2 months otherwise, [facility] MD.

## 2015-04-27 ENCOUNTER — Encounter: Payer: Self-pay | Admitting: Internal Medicine

## 2015-04-27 ENCOUNTER — Ambulatory Visit (INDEPENDENT_AMBULATORY_CARE_PROVIDER_SITE_OTHER): Payer: BC Managed Care – PPO | Admitting: Internal Medicine

## 2015-04-27 VITALS — BP 119/58 | HR 63 | Temp 98.1°F | Ht 65.0 in | Wt 180.0 lb

## 2015-04-27 DIAGNOSIS — E785 Hyperlipidemia, unspecified: Secondary | ICD-10-CM

## 2015-04-27 DIAGNOSIS — E119 Type 2 diabetes mellitus without complications: Secondary | ICD-10-CM | POA: Diagnosis not present

## 2015-04-27 DIAGNOSIS — Z0000 Encounter for general adult medical examination without abnormal findings: Secondary | ICD-10-CM

## 2015-04-27 DIAGNOSIS — K219 Gastro-esophageal reflux disease without esophagitis: Secondary | ICD-10-CM | POA: Diagnosis not present

## 2015-04-27 DIAGNOSIS — Z23 Encounter for immunization: Secondary | ICD-10-CM

## 2015-04-27 DIAGNOSIS — I10 Essential (primary) hypertension: Secondary | ICD-10-CM

## 2015-04-27 LAB — CBC
HCT: 39.7 % (ref 36.0–46.0)
HEMOGLOBIN: 12.8 g/dL (ref 12.0–15.0)
MCH: 31.1 pg (ref 26.0–34.0)
MCHC: 32.2 g/dL (ref 30.0–36.0)
MCV: 96.4 fL (ref 78.0–100.0)
MPV: 10.5 fL (ref 8.6–12.4)
PLATELETS: 232 10*3/uL (ref 150–400)
RBC: 4.12 MIL/uL (ref 3.87–5.11)
RDW: 13.8 % (ref 11.5–15.5)
WBC: 4.3 10*3/uL (ref 4.0–10.5)

## 2015-04-27 LAB — COMPLETE METABOLIC PANEL WITH GFR
ALT: 12 U/L (ref 0–35)
AST: 20 U/L (ref 0–37)
Albumin: 4.2 g/dL (ref 3.5–5.2)
Alkaline Phosphatase: 71 U/L (ref 39–117)
BILIRUBIN TOTAL: 0.8 mg/dL (ref 0.2–1.2)
BUN: 20 mg/dL (ref 6–23)
CO2: 31 mEq/L (ref 19–32)
Calcium: 9.8 mg/dL (ref 8.4–10.5)
Chloride: 100 mEq/L (ref 96–112)
Creat: 1.39 mg/dL — ABNORMAL HIGH (ref 0.50–1.10)
GFR, Est African American: 45 mL/min — ABNORMAL LOW
GFR, Est Non African American: 39 mL/min — ABNORMAL LOW
Glucose, Bld: 110 mg/dL — ABNORMAL HIGH (ref 70–99)
Potassium: 3.7 mEq/L (ref 3.5–5.3)
Sodium: 139 mEq/L (ref 135–145)
Total Protein: 7.6 g/dL (ref 6.0–8.3)

## 2015-04-27 LAB — LIPID PANEL
CHOLESTEROL: 180 mg/dL (ref 0–200)
HDL: 62 mg/dL (ref >=46)
LDL CALC: 107 mg/dL — AB (ref 0–99)
Total CHOL/HDL Ratio: 2.9 Ratio
Triglycerides: 55 mg/dL (ref <150)
VLDL: 11 mg/dL (ref 0–40)

## 2015-04-27 LAB — GLUCOSE, CAPILLARY: Glucose-Capillary: 108 mg/dL — ABNORMAL HIGH (ref 65–99)

## 2015-04-27 LAB — POCT GLYCOSYLATED HEMOGLOBIN (HGB A1C): HEMOGLOBIN A1C: 6.8

## 2015-04-27 MED ORDER — SIMVASTATIN 40 MG PO TABS: ORAL_TABLET | ORAL | Status: DC

## 2015-04-27 NOTE — Progress Notes (Signed)
   Subjective:    Patient ID: [name], female    DOB: August 08, 1946, 69 y.o.   MRN: [id number]  HPI [name] is a 69 year old female who comes in for routine health check up. Her last visit was more than a year ago. She follows the clinic for hypertension, and diabetes. She has no new complaints except some runny nose for 3 days, no fever. She reports she is regular with her medications. She is agreeable to doing colonoscopy this time (she was referred previously when she did not follow up)  Review of Systems  Constitutional: Negative.   HENT: Positive for sinus pressure. Negative for sore throat and trouble swallowing.   Respiratory: Negative for chest tightness, shortness of breath and wheezing.   Cardiovascular: Negative for chest pain, palpitations and leg swelling.  Musculoskeletal: Negative.       Objective:   Physical Exam  Constitutional: She is oriented to person, place, and time. She appears well-developed and well-nourished. No distress.  HENT:  Nose: Nose normal.  Mouth/Throat: Oropharynx is clear and moist.  Eyes: Conjunctivae and EOM are normal. Pupils are equal, round, and reactive to light. Right eye exhibits no discharge. Left eye exhibits no discharge.  Neck: Normal range of motion. No thyromegaly present.  Cardiovascular: Normal rate and regular rhythm.   No murmur heard. Pulmonary/Chest: Effort normal and breath sounds normal.  Abdominal: Soft.  Musculoskeletal: Normal range of motion.  Neurological: She is alert and oriented to person, place, and time.  Foot exam: No deformities, skin intact, sensory exam normal, proprioception - intact, good pulses bilaterally.   Skin: Skin is warm.        Assessment & Plan:   Please see problem based charting. Minor runny nose, over the counter benadryl suggested to patient.

## 2015-04-27 NOTE — Assessment & Plan Note (Signed)
Well controlled. On Simvastatin, refilled today. Lipid panel today.

## 2015-04-27 NOTE — Assessment & Plan Note (Signed)
Well controlled blood pressure.  BP Readings from Last 3 Encounters:  04/27/15 119/58  08/09/14 129/79  08/05/14 145/68   Continue current treatment.

## 2015-04-27 NOTE — Assessment & Plan Note (Addendum)
Patient reports not taking any PPI at this time. Reports no ongoing symptoms.

## 2015-04-27 NOTE — Patient Instructions (Signed)
[name], [name] to see you. You are doing well.  Your blood pressure is under control.  BP Readings from Last 3 Encounters:  04/27/15 119/58  08/09/14 129/79  08/05/14 145/68   We will check your blood work for diabetes and high blood pressure related values. I will call you if it is abnormal.  I have referred you to COLONOSCOPY.  We did a foot exam today. You received a PREVNAR vaccination for protection against pneumonia today.   Madilyn Fireman MD MPH 04/27/2015 10:43 AM  Pneumococcal Vaccine, Polyvalent suspension for injection What is this medicine? PNEUMOCOCCAL VACCINE, POLYVALENT (NEU mo KOK al vak SEEN, pol ee VEY luhnt) is a vaccine to prevent pneumococcus bacteria infection. These bacteria are a major cause of ear infections, 'Strep throat' infections, and serious pneumonia, meningitis, or blood infections worldwide. These vaccines help the body to produce antibodies (protective substances) that help your body defend against these bacteria. This vaccine is recommended for infants and young children. This vaccine will not treat an infection. This medicine may be used for other purposes; ask your health care provider or pharmacist if you have questions. COMMON BRAND NAME(S): Prevnar 13 What should I tell my health care provider before I take this medicine? They need to know if you have any of these conditions: -bleeding problems -fever -immune system problems -low platelet count in the blood -seizures -an unusual or allergic reaction to pneumococcal vaccine, diphtheria toxoid, other vaccines, latex, other medicines, foods, dyes, or preservatives -pregnant or trying to get pregnant -breast-feeding How should I use this medicine? This vaccine is for injection into a muscle. It is given by a health care professional. A copy of Vaccine Information Statements will be given before each vaccination. Read this sheet carefully each time. The sheet may change frequently. Talk to your  pediatrician regarding the use of this medicine in children. While this drug may be prescribed for children as young as 56 weeks old for selected conditions, precautions do apply. Overdosage: If you think you have taken too much of this medicine contact a poison control center or emergency room at once. NOTE: This medicine is only for you. Do not share this medicine with others. What if I miss a dose? It is important not to miss your dose. Call your doctor or health care professional if you are unable to keep an appointment. What may interact with this medicine? -medicines for cancer chemotherapy -medicines that suppress your immune function -medicines that treat or prevent blood clots like warfarin, enoxaparin, and dalteparin -steroid medicines like prednisone or cortisone This list may not describe all possible interactions. Give your health care provider a list of all the medicines, herbs, non-prescription drugs, or dietary supplements you use. Also tell them if you smoke, drink alcohol, or use illegal drugs. Some items may interact with your medicine. What should I watch for while using this medicine? Mild fever and pain should go away in 3 days or less. Report any unusual symptoms to your doctor or health care professional. What side effects may I notice from receiving this medicine? Side effects that you should report to your doctor or health care professional as soon as possible: -allergic reactions like skin rash, itching or hives, swelling of the face, lips, or tongue -breathing problems -confused -fever over 102 degrees F -pain, tingling, numbness in the hands or feet -seizures -unusual bleeding or bruising -unusual muscle weakness Side effects that usually do not require medical attention (report to your doctor or  health care professional if they continue or are bothersome): -aches and pains -diarrhea -fever of 102 degrees F or less -headache -irritable -loss of  appetite -pain, tender at site where injected -trouble sleeping This list may not describe all possible side effects. Call your doctor for medical advice about side effects. You may report side effects to FDA at 1-800-FDA-1088. Where should I keep my medicine? This does not apply. This vaccine is given in a clinic, pharmacy, doctor's office, or other health care setting and will not be stored at home. NOTE: This sheet is a summary. It may not cover all possible information. If you have questions about this medicine, talk to your doctor, pharmacist, or health care provider.  2015, Elsevier/Gold Standard. (2009-01-12 10:17:22)

## 2015-04-27 NOTE — Assessment & Plan Note (Signed)
Assessment: Diabetes without complication on Actos. No mention of hypoglycemia per patient. Patient reports compliance. Does not measure blood sugars on a daily basis. Last three A1c Values  Component Value Date   HGBA1C 6.6 01/20/2014   HGBA1C 6.5 10/27/2013   HGBA1C 6.8 07/22/2013    Plan:  A1c today  Foot exam today  No change in medications unless A1c is very derranged.

## 2015-04-27 NOTE — Assessment & Plan Note (Signed)
   Prevnar today  Colonoscopy referral today  Foot exam today  CMP, CBC, Lipids, A1c today.

## 2015-06-14 NOTE — Addendum Note (Signed)
Addended by: Hulan Fray on: 06/14/2015 07:26 PM   Modules accepted: Orders

## 2015-09-16 ENCOUNTER — Ambulatory Visit (INDEPENDENT_AMBULATORY_CARE_PROVIDER_SITE_OTHER): Payer: BC Managed Care – PPO | Admitting: *Deleted

## 2015-09-16 DIAGNOSIS — Z23 Encounter for immunization: Secondary | ICD-10-CM | POA: Diagnosis not present

## 2015-09-20 ENCOUNTER — Encounter: Payer: Self-pay | Admitting: *Deleted

## 2016-01-20 DIAGNOSIS — Z1211 Encounter for screening for malignant neoplasm of colon: Secondary | ICD-10-CM | POA: Diagnosis not present

## 2016-01-20 DIAGNOSIS — D123 Benign neoplasm of transverse colon: Secondary | ICD-10-CM | POA: Diagnosis not present

## 2016-01-20 DIAGNOSIS — K635 Polyp of colon: Secondary | ICD-10-CM | POA: Diagnosis not present

## 2016-01-22 DIAGNOSIS — J0100 Acute maxillary sinusitis, unspecified: Secondary | ICD-10-CM | POA: Diagnosis not present

## 2016-03-06 ENCOUNTER — Other Ambulatory Visit: Payer: Self-pay

## 2016-03-06 MED ORDER — PIOGLITAZONE HCL 30 MG PO TABS: 30.0000 mg | ORAL_TABLET | Freq: Every day | ORAL | Status: DC

## 2016-03-06 NOTE — Telephone Encounter (Signed)
Upcoming appointment scheduled

## 2016-03-10 ENCOUNTER — Encounter: Payer: Self-pay | Admitting: Internal Medicine

## 2016-03-10 ENCOUNTER — Ambulatory Visit (INDEPENDENT_AMBULATORY_CARE_PROVIDER_SITE_OTHER): Payer: BC Managed Care – PPO | Admitting: Internal Medicine

## 2016-03-10 DIAGNOSIS — I10 Essential (primary) hypertension: Secondary | ICD-10-CM | POA: Diagnosis not present

## 2016-03-10 DIAGNOSIS — Z0000 Encounter for general adult medical examination without abnormal findings: Secondary | ICD-10-CM

## 2016-03-10 DIAGNOSIS — E785 Hyperlipidemia, unspecified: Secondary | ICD-10-CM | POA: Diagnosis not present

## 2016-03-10 DIAGNOSIS — E119 Type 2 diabetes mellitus without complications: Secondary | ICD-10-CM | POA: Diagnosis not present

## 2016-03-10 LAB — POCT GLYCOSYLATED HEMOGLOBIN (HGB A1C): Hemoglobin A1C: 6.5

## 2016-03-10 LAB — GLUCOSE, CAPILLARY: Glucose-Capillary: 130 mg/dL — ABNORMAL HIGH (ref 65–99)

## 2016-03-10 NOTE — Assessment & Plan Note (Signed)
She reports taking simvastatin in the morning.  I reminded her to take this in the evening. -cont simvastatin

## 2016-03-10 NOTE — Patient Instructions (Signed)
Thank you for your visit today.   Please return to the internal medicine clinic in about 6 month(s) or sooner if needed.     I have made the following additions/changes to your medications:  Continue your current medications.  You will need a bone density scan.  I will check your urine today.   Please be sure to bring all of your medications with you to every visit; this includes herbal supplements, vitamins, eye drops, and any over-the-counter medications.   Should you have any questions regarding your medications and/or any new or worsening symptoms, please be sure to call the clinic at [phone].   If you believe that you are suffering from a life threatening condition or one that may result in the loss of limb or function, then you should call 911 and proceed to the nearest Emergency Department.   A healthy lifestyle and preventative care can promote health and wellness.   Maintain regular health, dental, and eye exams.  Eat a healthy diet. Foods like vegetables, fruits, whole grains, low-fat dairy products, and lean protein foods contain the nutrients you need without too many calories. Decrease your intake of foods high in solid fats, added sugars, and salt. Get information about a proper diet from your caregiver, if necessary.  Regular physical exercise is one of the most important things you can do for your health. Most adults should get at least 150 minutes of moderate-intensity exercise (any activity that increases your heart rate and causes you to sweat) each week. In addition, most adults need muscle-strengthening exercises on 2 or more days a week.   Maintain a healthy weight. The body mass index (BMI) is a screening tool to identify possible weight problems. It provides an estimate of body fat based on height and weight. Your caregiver can help determine your BMI, and can help you achieve or maintain a healthy weight. For adults 20 years and older:  A BMI below 18.5 is  considered underweight.  A BMI of 18.5 to 24.9 is normal.  A BMI of 25 to 29.9 is considered overweight.  A BMI of 30 and above is considered obese.

## 2016-03-10 NOTE — Progress Notes (Signed)
Patient ID: [name], female   DOB: Aug 24, 1946, 70 y.o.   MRN: [id number]     Subjective:   Patient ID: [name] female    DOB: [birth year] 70 y.o.    MRN: [id number] Health Maintenance Due: Health Maintenance Due  Topic Date Due  . TETANUS/TDAP  07/21/1965  . ZOSTAVAX  07/21/2006  . DEXA SCAN  07/22/2011  . URINE MICROALBUMIN  10/27/2014  . HEMOGLOBIN A1C  10/27/2015    _________________________________________________  HPI: [name] is a 70 y.o. female here for a routine f/u visit.  Pt has a PMH outlined below.  Please see problem-based charting assessment and plan for further status of patient's chronic medical problems addressed at today's visit.  PMH: Past Medical History  Diagnosis Date  . Diabetes mellitus   . GERD (gastroesophageal reflux disease)   . Hypertension   . HLD (hyperlipidemia)     Medications: Current Outpatient Prescriptions on File Prior to Visit  Medication Sig Dispense Refill  . aspirin 81 MG chewable tablet Chew 81 mg by mouth daily.      Marland Kitchen glucose blood test strip Check blood sugar twice a day before breakfast and before dinner 100 each 3  . hydrochlorothiazide (HYDRODIURIL) 25 MG tablet TAKE 1 TABLET BY MOUTH EVERY DAY 30 tablet 5  . Lancets (ACCU-CHEK MULTICLIX) lancets 1 each by Other route as needed. Use as instructed     . nitroGLYCERIN (NITROGLYN) 2 % ointment Apply 0.5 inches topically every 6 (six) hours. 30 g 1  . pioglitazone (ACTOS) 30 MG tablet Take 1 tablet (30 mg total) by mouth daily. 90 tablet 3  . simvastatin (ZOCOR) 40 MG tablet TAKE ONE TABLET BY MOUTH AT BEDTIME 90 tablet 3   No current facility-administered medications on file prior to visit.    Allergies: Allergies  Allergen Reactions  . Metformin     REACTION: Nausea/Diarrhea, feeling very sick, missing work  . Metronidazole     REACTION: bad dreams, drunk feeling, seeing things    FH: Family History  Problem Relation Age of Onset  . Stroke Neg Hx    . Heart disease Neg Hx   . Cancer Neg Hx     SH: Social History   Social History  . Marital Status: Single    Spouse Name: N/A  . Number of Children: N/A  . Years of Education: 9   Occupational History  .  [place] History Main Topics  . Smoking status: Never Smoker   . Smokeless tobacco: None  . Alcohol Use: No  . Drug Use: No  . Sexual Activity: Yes    Birth Control/ Protection: None   Other Topics Concern  . None   Social History Narrative    Review of Systems: Constitutional: Negative for fever, chills.  Eyes: Negative for blurred vision.  Respiratory: Negative for cough and shortness of breath.  Cardiovascular: Negative for chest pain.  Gastrointestinal: Negative for nausea, vomiting. Neurological: Negative for dizziness.   Objective:   Vital Signs: Filed Vitals:   03/10/16 1031  BP: 133/63  Pulse: 63  Temp: 98.6 F (37 C)  TempSrc: Oral  Height: 5\' 5"  (1.651 m)  Weight: 188 lb 8 oz (85.503 kg)  SpO2: 100%      BP Readings from Last 3 Encounters:  03/10/16 133/63  04/27/15 119/58  08/09/14 129/79    Physical Exam: Constitutional: Vital signs reviewed.  Patient is in NAD and cooperative with exam.  Head:  Normocephalic and atraumatic. Eyes: EOMI, conjunctivae nl, no scleral icterus.  Neck: Supple. Cardiovascular: RRR, no MRG. Pulmonary/Chest: normal effort, CTAB, no wheezes, rales, or rhonchi. Abdominal: Soft. NT/ND +BS. Neurological: A&O x3, cranial nerves II-XII are grossly intact, moving all extremities. Extremities: No LE edema. Skin: Warm, dry and intact. No rash.   Assessment & Plan:   Assessment and plan was discussed and formulated with my attending.

## 2016-03-10 NOTE — Assessment & Plan Note (Addendum)
BP 133/63, is taking HCTZ 25mg  daily. She reports taking it several times weekly but not daily. -cont HCTZ 25mg   -check BMP

## 2016-03-10 NOTE — Assessment & Plan Note (Signed)
-  will obtain DEXA

## 2016-03-10 NOTE — Assessment & Plan Note (Addendum)
Pt states she take pioglitazone several times weekly.  HA1c today is 6.5.  She reports trying to heat more healthy.  We discussed lowering the dose or coming off of this medication but she prefers to stay on it.  She is also trying to lose weight and exercise.  -cont pioglitazone -repeat HA1c 6 months  -check urine microalb

## 2016-03-11 LAB — BMP8+ANION GAP
ANION GAP: 18 mmol/L (ref 10.0–18.0)
BUN / CREAT RATIO: 20 (ref 12–28)
BUN: 22 mg/dL (ref 8–27)
CHLORIDE: 100 mmol/L (ref 96–106)
CO2: 24 mmol/L (ref 18–29)
Calcium: 9.7 mg/dL (ref 8.7–10.3)
Creatinine, Ser: 1.09 mg/dL — ABNORMAL HIGH (ref 0.57–1.00)
GFR calc Af Amer: 60 mL/min/{1.73_m2} (ref >59)
GFR calc non Af Amer: 52 mL/min/{1.73_m2} — ABNORMAL LOW (ref >59)
Glucose: 98 mg/dL (ref 65–99)
Potassium: 4.1 mmol/L (ref 3.5–5.2)
SODIUM: 142 mmol/L (ref 134–144)

## 2016-03-11 LAB — LIPID PANEL
CHOLESTEROL TOTAL: 183 mg/dL (ref 100–199)
Chol/HDL Ratio: 2.5 ratio units (ref 0.0–4.4)
HDL: 74 mg/dL (ref >39)
LDL CALC: 97 mg/dL (ref 0–99)
Triglycerides: 60 mg/dL (ref 0–149)
VLDL CHOLESTEROL CAL: 12 mg/dL (ref 5–40)

## 2016-03-11 LAB — MICROALBUMIN / CREATININE URINE RATIO
Creatinine, Urine: 137.4 mg/dL
MICROALB/CREAT RATIO: 5.2 mg/g creat (ref 0.0–30.0)
Microalbumin, Urine: 7.2 ug/mL

## 2016-03-13 NOTE — Progress Notes (Signed)
Internal Medicine Clinic Attending  Case discussed with Dr. Gill soon after the resident saw the patient.  We reviewed the resident's history and exam and pertinent patient test results.  I agree with the assessment, diagnosis, and plan of care documented in the resident's note.  

## 2016-03-13 NOTE — Addendum Note (Signed)
Addended by: Gilles Chiquito B on: 03/13/2016 03:33 PM   Modules accepted: Level of Service

## 2016-05-13 ENCOUNTER — Other Ambulatory Visit: Payer: Self-pay | Admitting: Internal Medicine

## 2016-05-22 ENCOUNTER — Telehealth: Payer: Self-pay | Admitting: *Deleted

## 2016-05-22 NOTE — Telephone Encounter (Signed)
Call from pt stating she hasn't been able to get her hctz and simvastatin refilled for 2 wks.  I informed pt that refills were sent in on 05/13/2016 and offered to contact pharmacy to confirm they have the rx. pharmacy was called and I was informed that medications were there-waiting to be picked up.  Pharmacy apologized for the oversight and pt was made aware that meds are ready for pick-up.  Phone call complete.[name]7/10/20172:27 PM

## 2016-07-07 ENCOUNTER — Other Ambulatory Visit: Payer: Self-pay

## 2016-08-08 ENCOUNTER — Ambulatory Visit (INDEPENDENT_AMBULATORY_CARE_PROVIDER_SITE_OTHER): Payer: BC Managed Care – PPO | Admitting: *Deleted

## 2016-08-08 DIAGNOSIS — Z23 Encounter for immunization: Secondary | ICD-10-CM

## 2016-08-13 ENCOUNTER — Encounter (HOSPITAL_COMMUNITY): Payer: Self-pay | Admitting: Emergency Medicine

## 2016-08-13 ENCOUNTER — Inpatient Hospital Stay (HOSPITAL_COMMUNITY)
Admission: EM | Admit: 2016-08-13 | Discharge: 2016-08-15 | DRG: 392 | Disposition: A | Discharge status: Home / Self Care | Payer: BC Managed Care – PPO | Attending: Internal Medicine | Admitting: Internal Medicine

## 2016-08-13 DIAGNOSIS — I129 Hypertensive chronic kidney disease with stage 1 through stage 4 chronic kidney disease, or unspecified chronic kidney disease: Secondary | ICD-10-CM | POA: Diagnosis present

## 2016-08-13 DIAGNOSIS — N183 Chronic kidney disease, stage 3 (moderate): Secondary | ICD-10-CM | POA: Diagnosis present

## 2016-08-13 DIAGNOSIS — E119 Type 2 diabetes mellitus without complications: Secondary | ICD-10-CM | POA: Diagnosis present

## 2016-08-13 DIAGNOSIS — K219 Gastro-esophageal reflux disease without esophagitis: Secondary | ICD-10-CM | POA: Diagnosis present

## 2016-08-13 DIAGNOSIS — E1122 Type 2 diabetes mellitus with diabetic chronic kidney disease: Secondary | ICD-10-CM | POA: Diagnosis not present

## 2016-08-13 DIAGNOSIS — K5792 Diverticulitis of intestine, part unspecified, without perforation or abscess without bleeding: Secondary | ICD-10-CM | POA: Diagnosis not present

## 2016-08-13 DIAGNOSIS — R1084 Generalized abdominal pain: Secondary | ICD-10-CM | POA: Diagnosis not present

## 2016-08-13 DIAGNOSIS — Z888 Allergy status to other drugs, medicaments and biological substances status: Secondary | ICD-10-CM

## 2016-08-13 DIAGNOSIS — E785 Hyperlipidemia, unspecified: Secondary | ICD-10-CM | POA: Diagnosis present

## 2016-08-13 DIAGNOSIS — R300 Dysuria: Secondary | ICD-10-CM | POA: Diagnosis not present

## 2016-08-13 DIAGNOSIS — Z23 Encounter for immunization: Secondary | ICD-10-CM

## 2016-08-13 DIAGNOSIS — Z7982 Long term (current) use of aspirin: Secondary | ICD-10-CM

## 2016-08-13 DIAGNOSIS — Z79899 Other long term (current) drug therapy: Secondary | ICD-10-CM

## 2016-08-13 DIAGNOSIS — E1169 Type 2 diabetes mellitus with other specified complication: Secondary | ICD-10-CM

## 2016-08-13 DIAGNOSIS — N189 Chronic kidney disease, unspecified: Secondary | ICD-10-CM

## 2016-08-13 DIAGNOSIS — K5732 Diverticulitis of large intestine without perforation or abscess without bleeding: Principal | ICD-10-CM | POA: Diagnosis present

## 2016-08-13 DIAGNOSIS — K6389 Other specified diseases of intestine: Secondary | ICD-10-CM | POA: Diagnosis not present

## 2016-08-13 DIAGNOSIS — I10 Essential (primary) hypertension: Secondary | ICD-10-CM | POA: Diagnosis present

## 2016-08-13 LAB — CBC WITH DIFFERENTIAL/PLATELET
BASOS PCT: 0 %
Basophils Absolute: 0 10*3/uL (ref 0.0–0.1)
EOS ABS: 0 10*3/uL (ref 0.0–0.7)
Eosinophils Relative: 0 %
HEMATOCRIT: 39.1 % (ref 36.0–46.0)
HEMOGLOBIN: 12.6 g/dL (ref 12.0–15.0)
LYMPHS ABS: 1.2 10*3/uL (ref 0.7–4.0)
Lymphocytes Relative: 10 %
MCH: 31.7 pg (ref 26.0–34.0)
MCHC: 32.2 g/dL (ref 30.0–36.0)
MCV: 98.5 fL (ref 78.0–100.0)
Monocytes Absolute: 0.8 10*3/uL (ref 0.1–1.0)
Monocytes Relative: 7 %
Neutro Abs: 9.5 10*3/uL — ABNORMAL HIGH (ref 1.7–7.7)
Neutrophils Relative %: 83 %
Platelets: 209 10*3/uL (ref 150–400)
RBC: 3.97 MIL/uL (ref 3.87–5.11)
RDW: 14.4 % (ref 11.5–15.5)
WBC: 11.5 10*3/uL — ABNORMAL HIGH (ref 4.0–10.5)

## 2016-08-13 LAB — COMPREHENSIVE METABOLIC PANEL
ALK PHOS: 66 U/L (ref 38–126)
ALT: 15 U/L (ref 14–54)
ANION GAP: 11 (ref 5–15)
AST: 23 U/L (ref 15–41)
Albumin: 4.1 g/dL (ref 3.5–5.0)
BILIRUBIN TOTAL: 1 mg/dL (ref 0.3–1.2)
BUN: 15 mg/dL (ref 6–20)
CALCIUM: 10 mg/dL (ref 8.9–10.3)
CO2: 27 mmol/L (ref 22–32)
CREATININE: 1.17 mg/dL — AB (ref 0.44–1.00)
Chloride: 100 mmol/L — ABNORMAL LOW (ref 101–111)
GFR calc non Af Amer: 46 mL/min — ABNORMAL LOW (ref >60)
GFR, EST AFRICAN AMERICAN: 53 mL/min — AB (ref >60)
GLUCOSE: 140 mg/dL — AB (ref 65–99)
Potassium: 3.5 mmol/L (ref 3.5–5.1)
SODIUM: 138 mmol/L (ref 135–145)
TOTAL PROTEIN: 7.8 g/dL (ref 6.5–8.1)

## 2016-08-13 LAB — I-STAT CG4 LACTIC ACID, ED: Lactic Acid, Venous: 1.95 mmol/L (ref 0.5–1.9)

## 2016-08-13 MED ORDER — SODIUM CHLORIDE 0.9 % IV BOLUS (SEPSIS)
1000.0000 mL | Freq: Once | INTRAVENOUS | Status: AC
  Administered 2016-08-13: 1000 mL via INTRAVENOUS

## 2016-08-13 NOTE — ED Provider Notes (Signed)
[facility] DEPT Provider Note   CSN: [id number] Arrival date & time: 08/13/16  2137 By signing my name below, I, Dyke Brackett, attest that this documentation has been prepared under the direction and in the presence of Quintella Reichert, MD . Electronically Signed: Dyke Brackett, Scribe. 08/13/2016. 11:26 PM.   History   Chief Complaint Chief Complaint  Patient presents with  . Abdominal Pain  . Dysuria    HPI [name] is a 70 y.o. female with hx of GERD and DM who presents to the Emergency Department complaining of moderate diffuse abdominal pain, worse in the lower abdomen, onset yesterday. Her pain is intermittent and has worsened since onset. She notes associated subjective fever, nausea, and dysuria. No alleviating or modifying factors noted. She has never experienced these symptoms before. She denies vomiting, diarrhea, constipation, or any other symptoms. NKDA.   The history is provided by the patient. No language interpreter was used.    Past Medical History:  Diagnosis Date  . Diabetes mellitus   . GERD (gastroesophageal reflux disease)   . HLD (hyperlipidemia)   . Hypertension     Patient Active Problem List   Diagnosis Date Noted  . Routine health maintenance 03/20/2012  . Diabetes type 2, controlled ([place]) 09/06/2007  . Dyslipidemia 09/06/2007  . Essential hypertension 09/06/2007  . GERD 08/15/2006    History reviewed. No pertinent surgical history.  OB History    No data available       Home Medications    Prior to Admission medications   Medication Sig Start Date End Date Taking? Authorizing Provider  aspirin 81 MG chewable tablet Chew 81 mg by mouth daily.      Historical Provider, MD  glucose blood test strip Check blood sugar twice a day before breakfast and before dinner 03/20/12   Thera Flake, MD  hydrochlorothiazide (HYDRODIURIL) 25 MG tablet TAKE 1 TABLET BY MOUTH EVERY DAY 05/15/16   Axel Filler, MD  Lancets ([place]) lancets 1 each by Other route as needed. Use as instructed     Historical Provider, MD  nitroGLYCERIN (NITROGLYN) 2 % ointment Apply 0.5 inches topically every 6 (six) hours. 10/08/13 10/08/14  Ejiroghene Arlyce Dice, MD  pioglitazone (ACTOS) 30 MG tablet Take 1 tablet (30 mg total) by mouth daily. 03/06/16   Oval Linsey, MD  simvastatin (ZOCOR) 40 MG tablet TAKE 1 TABLET BY MOUTH AT BEDTIME 05/15/16   Axel Filler, MD    Family History Family History  Problem Relation Age of Onset  . Stroke Neg Hx   . Heart disease Neg Hx   . Cancer Neg Hx     Social History Social History  Substance Use Topics  . Smoking status: Never Smoker  . Smokeless tobacco: Not on file  . Alcohol use No    Allergies   Metformin and Metronidazole   Review of Systems Review of Systems 10 systems reviewed and all are negative for acute change except as noted in the HPI.  Physical Exam Updated Vital Signs BP 116/62   Pulse 78   Temp 98.6 F (37 C) (Oral)   Resp 20   Ht 5\' 5"  (1.651 m)   Wt 185 lb (83.9 kg)   LMP 04/10/1996   SpO2 96%   BMI 30.79 kg/m   Physical Exam  Constitutional: She is oriented to person, place, and time. She appears well-developed and well-nourished.  HENT:  Head: Normocephalic and atraumatic.  Cardiovascular: Normal rate and regular rhythm.  No murmur heard. Pulmonary/Chest: Effort normal and breath sounds normal. No respiratory distress.  Abdominal: Soft. There is tenderness. There is no rebound and no guarding.  Diffuse abdominal tenderness  Musculoskeletal: She exhibits no edema or tenderness.  Neurological: She is alert and oriented to person, place, and time.  Skin: Skin is warm and dry.  Psychiatric: She has a normal mood and affect. Her behavior is normal.  Nursing note and vitals reviewed.  ED Treatments / Results  DIAGNOSTIC STUDIES:  Oxygen Saturation is 100% on RA, normal by my interpretation.    COORDINATION OF CARE:  11:22 PM  Will order I-stat CG4 lactic acid, urine culture, and urinalysis. Discussed treatment plan with pt at bedside and pt agreed to plan.  Labs (all labs ordered are listed, but only abnormal results are displayed) Labs Reviewed  CBC WITH DIFFERENTIAL/PLATELET - Abnormal; Notable for the following:       Result Value   WBC 11.5 (*)    Neutro Abs 9.5 (*)    All other components within normal limits  COMPREHENSIVE METABOLIC PANEL - Abnormal; Notable for the following:    Chloride 100 (*)    Glucose, Bld 140 (*)    Creatinine, Ser 1.17 (*)    GFR calc non Af Amer 46 (*)    GFR calc Af Amer 53 (*)    All other components within normal limits  URINALYSIS, ROUTINE W REFLEX MICROSCOPIC (NOT AT [facility]) - Abnormal; Notable for the following:    Specific Gravity, Urine <1.005 (*)    Ketones, ur 15 (*)    Leukocytes, UA SMALL (*)    All other components within normal limits  URINE MICROSCOPIC-ADD ON - Abnormal; Notable for the following:    Squamous Epithelial / LPF 0-5 (*)    Bacteria, UA RARE (*)    All other components within normal limits  I-STAT CG4 LACTIC ACID, ED - Abnormal; Notable for the following:    Lactic Acid, Venous 1.95 (*)    All other components within normal limits  URINE CULTURE    EKG  EKG Interpretation None       Radiology Ct Abdomen Pelvis Wo Contrast  Result Date: 08/14/2016 CLINICAL DATA:  Diffuse abdominal pain with severe nausea for 2 days. EXAM: CT ABDOMEN AND PELVIS WITHOUT CONTRAST TECHNIQUE: Multidetector CT imaging of the abdomen and pelvis was performed following the standard protocol without IV contrast. COMPARISON:  None. FINDINGS: Lower chest: Mild dependent changes in the lung bases. Small pericardial effusion. Hepatobiliary: Unenhanced appearance is unremarkable. Pancreas: Unenhanced appearance is unremarkable. Spleen: Unenhanced appearance is unremarkable. Adrenals/Urinary Tract: Unenhanced appearance is unremarkable. No renal or ureteral stones. No  hydronephrosis or hydroureter. Stomach/Bowel: Stomach and small bowel are decompressed. Scattered gas and stool throughout the colon. No colonic distention. Scattered diverticula are demonstrated in the sigmoid colon. There is inflammatory stranding around the upper sigmoid colon with associated wall thickening. This most likely represents acute diverticulitis. Area of wall thickening demonstrates a fairly abrupt transition. Follow-up evaluation of the colon after resolution of acute process is suggested to exclude underlying colon cancer. No abscess. Vascular/Lymphatic: Aortic atherosclerosis. No enlarged abdominal or pelvic lymph nodes. Reproductive: Uterus and bilateral adnexa are unremarkable. Other: Abdominal wall musculature appears intact. No free air or free fluid in the abdomen. Musculoskeletal: Degenerative changes in the spine. No destructive bone lesions. IMPRESSION: Inflammatory changes and wall thickening demonstrated in the sigmoid colon most consistent with acute diverticulitis. No abscess. Wall thickening demonstrates a somewhat abrupt transition. Evaluation  of the colon after resolution of the acute process is recommended to exclude an underlying colon cancer. Electronically Signed   By: Lucienne Capers M.D.   On: 08/14/2016 01:58    Procedures Procedures (including critical care time)  Medications Ordered in ED Medications  sodium chloride 0.9 % bolus 1,000 mL (0 mLs Intravenous Stopped 08/14/16 0033)  morphine 2 MG/ML injection 2 mg (2 mg Intravenous Given 08/14/16 0056)  piperacillin-tazobactam (ZOSYN) IVPB 3.375 g (0 g Intravenous Stopped 08/14/16 0325)     Initial Impression / Assessment and Plan / ED Course  I have reviewed the triage vital signs and the nursing notes.  Pertinent labs & imaging results that were available during my care of the patient were reviewed by me and considered in my medical decision making (see chart for details).  Clinical Course    Patient here  for evaluation of abdominal pain. She has significant tenderness on examination. CT scan demonstrates acute diverticulitis. Providing IV antibiotics and plan to admit for observation patient has nausea, ongoing pain. She was given a one time dose of zosyn in the ED due to flagyl allergy.    Final Clinical Impressions(s) / ED Diagnoses   Final diagnoses:  Acute diverticulitis    New Prescriptions New Prescriptions   No medications on file  I personally performed the services described in this documentation, which was scribed in my presence. The recorded information has been reviewed and is accurate.    Quintella Reichert, MD 08/14/16 [phone]

## 2016-08-13 NOTE — ED Triage Notes (Signed)
Pt. reports low abdominal pain with dysuria and nausea onset yesterday , denies fever or chills , no emesis or diarrhea .

## 2016-08-14 ENCOUNTER — Emergency Department (HOSPITAL_COMMUNITY): Payer: BC Managed Care – PPO

## 2016-08-14 DIAGNOSIS — Z888 Allergy status to other drugs, medicaments and biological substances status: Secondary | ICD-10-CM

## 2016-08-14 DIAGNOSIS — I129 Hypertensive chronic kidney disease with stage 1 through stage 4 chronic kidney disease, or unspecified chronic kidney disease: Secondary | ICD-10-CM

## 2016-08-14 DIAGNOSIS — K5732 Diverticulitis of large intestine without perforation or abscess without bleeding: Secondary | ICD-10-CM | POA: Diagnosis not present

## 2016-08-14 DIAGNOSIS — Z23 Encounter for immunization: Secondary | ICD-10-CM | POA: Diagnosis not present

## 2016-08-14 DIAGNOSIS — K6389 Other specified diseases of intestine: Secondary | ICD-10-CM | POA: Diagnosis not present

## 2016-08-14 DIAGNOSIS — Z7984 Long term (current) use of oral hypoglycemic drugs: Secondary | ICD-10-CM

## 2016-08-14 DIAGNOSIS — K5792 Diverticulitis of intestine, part unspecified, without perforation or abscess without bleeding: Secondary | ICD-10-CM | POA: Insufficient documentation

## 2016-08-14 DIAGNOSIS — E119 Type 2 diabetes mellitus without complications: Secondary | ICD-10-CM | POA: Diagnosis present

## 2016-08-14 DIAGNOSIS — Z7982 Long term (current) use of aspirin: Secondary | ICD-10-CM | POA: Diagnosis not present

## 2016-08-14 DIAGNOSIS — E785 Hyperlipidemia, unspecified: Secondary | ICD-10-CM | POA: Diagnosis present

## 2016-08-14 DIAGNOSIS — K219 Gastro-esophageal reflux disease without esophagitis: Secondary | ICD-10-CM | POA: Diagnosis present

## 2016-08-14 DIAGNOSIS — Z79899 Other long term (current) drug therapy: Secondary | ICD-10-CM

## 2016-08-14 DIAGNOSIS — E1122 Type 2 diabetes mellitus with diabetic chronic kidney disease: Secondary | ICD-10-CM

## 2016-08-14 DIAGNOSIS — N183 Chronic kidney disease, stage 3 (moderate): Secondary | ICD-10-CM | POA: Diagnosis present

## 2016-08-14 DIAGNOSIS — Z8249 Family history of ischemic heart disease and other diseases of the circulatory system: Secondary | ICD-10-CM

## 2016-08-14 DIAGNOSIS — Z881 Allergy status to other antibiotic agents status: Secondary | ICD-10-CM

## 2016-08-14 DIAGNOSIS — R300 Dysuria: Secondary | ICD-10-CM | POA: Diagnosis not present

## 2016-08-14 LAB — BASIC METABOLIC PANEL
ANION GAP: 9 (ref 5–15)
BUN: 13 mg/dL (ref 6–20)
CALCIUM: 9.3 mg/dL (ref 8.9–10.3)
CO2: 24 mmol/L (ref 22–32)
CREATININE: 1.07 mg/dL — AB (ref 0.44–1.00)
Chloride: 106 mmol/L (ref 101–111)
GFR calc Af Amer: 60 mL/min — ABNORMAL LOW (ref >60)
GFR, EST NON AFRICAN AMERICAN: 51 mL/min — AB (ref >60)
GLUCOSE: 138 mg/dL — AB (ref 65–99)
Potassium: 3.5 mmol/L (ref 3.5–5.1)
Sodium: 139 mmol/L (ref 135–145)

## 2016-08-14 LAB — CBC
HCT: 36.9 % (ref 36.0–46.0)
Hemoglobin: 11.7 g/dL — ABNORMAL LOW (ref 12.0–15.0)
MCH: 31 pg (ref 26.0–34.0)
MCHC: 31.7 g/dL (ref 30.0–36.0)
MCV: 97.6 fL (ref 78.0–100.0)
PLATELETS: 187 10*3/uL (ref 150–400)
RBC: 3.78 MIL/uL — ABNORMAL LOW (ref 3.87–5.11)
RDW: 14.6 % (ref 11.5–15.5)
WBC: 11.6 10*3/uL — AB (ref 4.0–10.5)

## 2016-08-14 LAB — URINALYSIS, ROUTINE W REFLEX MICROSCOPIC
BILIRUBIN URINE: NEGATIVE
Glucose, UA: NEGATIVE mg/dL
Hgb urine dipstick: NEGATIVE
Ketones, ur: 15 mg/dL — AB
NITRITE: NEGATIVE
Protein, ur: NEGATIVE mg/dL
pH: 6 (ref 5.0–8.0)

## 2016-08-14 LAB — URINE MICROSCOPIC-ADD ON

## 2016-08-14 LAB — GLUCOSE, CAPILLARY: GLUCOSE-CAPILLARY: 107 mg/dL — AB (ref 65–99)

## 2016-08-14 MED ORDER — ACETAMINOPHEN 325 MG PO TABS: 650.0000 mg | ORAL_TABLET | Freq: Four times a day (QID) | ORAL | Status: DC | PRN

## 2016-08-14 MED ORDER — KETOROLAC TROMETHAMINE 15 MG/ML IJ SOLN
15.0000 mg | Freq: Three times a day (TID) | INTRAMUSCULAR | Status: DC | PRN
  Administered 2016-08-14: 15 mg via INTRAVENOUS
  Filled 2016-08-14: qty 1

## 2016-08-14 MED ORDER — SENNOSIDES-DOCUSATE SODIUM 8.6-50 MG PO TABS
1.0000 | ORAL_TABLET | Freq: Every day | ORAL | Status: DC
  Administered 2016-08-14: 1 via ORAL
  Filled 2016-08-14: qty 1

## 2016-08-14 MED ORDER — ACETAMINOPHEN 650 MG RE SUPP: 650.0000 mg | Freq: Four times a day (QID) | RECTAL | Status: DC | PRN

## 2016-08-14 MED ORDER — MORPHINE SULFATE (PF) 2 MG/ML IV SOLN
2.0000 mg | Freq: Once | INTRAVENOUS | Status: AC
Start: 2016-08-14 — End: 2016-08-14
  Administered 2016-08-14: 2 mg via INTRAVENOUS
  Filled 2016-08-14: qty 1

## 2016-08-14 MED ORDER — ENOXAPARIN SODIUM 40 MG/0.4ML ~~LOC~~ SOLN
40.0000 mg | Freq: Every day | SUBCUTANEOUS | Status: DC
  Filled 2016-08-14 (×2): qty 0.4

## 2016-08-14 MED ORDER — PIPERACILLIN-TAZOBACTAM 3.375 G IVPB 30 MIN
3.3750 g | Freq: Once | INTRAVENOUS | Status: AC
  Administered 2016-08-14: 3.375 g via INTRAVENOUS
  Filled 2016-08-14: qty 50

## 2016-08-14 MED ORDER — SODIUM CHLORIDE 0.9 % IV SOLN
3.0000 g | Freq: Four times a day (QID) | INTRAVENOUS | Status: DC
  Administered 2016-08-14 – 2016-08-15 (×5): 3 g via INTRAVENOUS
  Filled 2016-08-14 (×11): qty 3

## 2016-08-14 MED ORDER — ASPIRIN EC 81 MG PO TBEC
81.0000 mg | DELAYED_RELEASE_TABLET | Freq: Every day | ORAL | Status: DC
  Administered 2016-08-14 – 2016-08-15 (×2): 81 mg via ORAL
  Filled 2016-08-14 (×2): qty 1

## 2016-08-14 MED ORDER — MORPHINE SULFATE (PF) 2 MG/ML IV SOLN: 2.0000 mg | INTRAVENOUS | Status: DC | PRN

## 2016-08-14 MED ORDER — AMOXICILLIN-POT CLAVULANATE 875-125 MG PO TABS: 1.0000 | ORAL_TABLET | Freq: Once | ORAL | Status: DC

## 2016-08-14 MED ORDER — PROMETHAZINE HCL 25 MG/ML IJ SOLN
12.5000 mg | Freq: Four times a day (QID) | INTRAMUSCULAR | Status: DC | PRN
  Administered 2016-08-14: 12.5 mg via INTRAVENOUS
  Filled 2016-08-14: qty 1

## 2016-08-14 MED ORDER — SODIUM CHLORIDE 0.9 % IV SOLN
INTRAVENOUS | Status: AC
  Administered 2016-08-14: 1000 mL via INTRAVENOUS

## 2016-08-14 MED ORDER — POLYETHYLENE GLYCOL 3350 17 G PO PACK: 17.0000 g | PACK | Freq: Every day | ORAL | Status: DC | PRN

## 2016-08-14 NOTE — ED Notes (Signed)
Pt transported to CT ?

## 2016-08-14 NOTE — H&P (Signed)
Date: 08/14/2016               Patient Name:  [name] MRN: [id number]  DOB: Apr 25, 1946 Age / Sex: 70 y.o., female   PCP: Axel Filler, MD         Medical Service: Internal Medicine Teaching Service         Attending Physician: Dr. Michel Bickers, MD    First Contact: Dr. Ledell Noss Pager: [phone]  Second Contact: Dr. Maryellen Pile Pager: [phone]       After Hours (After 5p/  First Contact Pager: [phone]  weekends / holidays): Second Contact Pager: [phone]   Chief Complaint: Abdominal pain  History of Present Illness: [name] is a 70 year old female with PMHx of diabetes mellitus type II, hypertension and GERD that presents with progressive lower abdominal pain for the past two days.  She states the pain started Saturday and first noticed after taking a nap.  The pain resolved on its own.  She was later awoken by the pain at 3am and described the pain as sharp and radiating to the back.  She states the pain was unbearable and ultimately brought her into the ED.  She did not try anything for pain relief at home. She states this kind of event has never happened before. She has been able to keep down liquids but has not tried solids as this makes her too nauseated. She reports the pain when it first started as a 10/10. She received morphine in the ED and currently reports the pain 7/10. She started vomiting in the ED but had not prior to arrival.  She does endorse nausea and fever and chills.  Meds:  Current Meds  Medication Sig  . aspirin 81 MG chewable tablet Chew 81 mg by mouth daily.    Marland Kitchen glucose blood test strip Check blood sugar twice a day before breakfast and before dinner  . hydrochlorothiazide (HYDRODIURIL) 25 MG tablet TAKE 1 TABLET BY MOUTH EVERY DAY  . pioglitazone (ACTOS) 30 MG tablet Take 1 tablet (30 mg total) by mouth daily.  . simvastatin (ZOCOR) 40 MG tablet TAKE 1 TABLET BY MOUTH AT BEDTIME     Allergies: Allergies as of 08/13/2016 -  Review Complete 08/13/2016  Allergen Reaction Noted  . Metformin    . Metronidazole     Past Medical History:  Diagnosis Date  . Diabetes mellitus   . GERD (gastroesophageal reflux disease)   . HLD (hyperlipidemia)   . Hypertension     Family History: Mother and father deceased, both had hypertension.  Social History: Tobacco abuse: denied Alcohol use:  denied Illicit drug use:  denied Lives in [place] with grandson  Review of Systems: A complete ROS was negative except as per HPI.   Physical Exam: Blood pressure 116/62, pulse 78, temperature 98.6 F (37 C), temperature source Oral, resp. rate 20, height 5\' 5"  (1.651 m), weight 185 lb (83.9 kg), last menstrual period 04/10/1996, SpO2 96 %. Vitals:   08/14/16 0230 08/14/16 0231 08/14/16 0245 08/14/16 0300  BP: (!) 113/54 (!) 113/54  116/62  Pulse: 78 78 81 78  Resp:  20    Temp:  98.6 F (37 C)    TempSrc:  Oral    SpO2: 100% 100% 98% 96%  Weight:  185 lb (83.9 kg)    Height:  5\' 5"  (1.651 m)     General: Vital signs reviewed.  Patient is well-developed and well-nourished, in  mild distress and cooperative with exam.  Head: Normocephalic and atraumatic. Eyes: EOMI, conjunctivae normal, no scleral icterus.  Neck: Supple, trachea midline, normal ROM, no JVD. Cardiovascular: RRR, S1 normal, S2 normal, no murmurs, gallops, or rubs. Pulmonary/Chest: Clear to auscultation bilaterally, no wheezes, rales, or rhonchi. Abdominal: Soft, tender in left and right lower quadrant, non-distended, BS +, no masses, organomegaly, or guarding present.  Musculoskeletal: No joint deformities, erythema, or stiffness, ROM full and nontender. Extremities: No lower extremity edema bilaterally,  pulses symmetric and intact bilaterally. No cyanosis or clubbing. Neurological: A&O x3, Strength is normal and symmetric bilaterally, no focal motor deficit, sensory intact to light touch bilaterally.  Skin: Warm, dry and intact. No rashes or  erythema. Psychiatric: Normal mood and affect. speech and behavior is normal. Cognition and memory are normal.   BMET    Component Value Date/Time   NA 138 08/13/2016 2156   NA 142 03/10/2016 1107   K 3.5 08/13/2016 2156   CL 100 (L) 08/13/2016 2156   CO2 27 08/13/2016 2156   GLUCOSE 140 (H) 08/13/2016 2156   BUN 15 08/13/2016 2156   BUN 22 03/10/2016 1107   CREATININE 1.17 (H) 08/13/2016 2156   CREATININE 1.39 (H) 04/27/2015 1103   CALCIUM 10.0 08/13/2016 2156   GFRNONAA 46 (L) 08/13/2016 2156   GFRNONAA 39 (L) 04/27/2015 1103   GFRAA 53 (L) 08/13/2016 2156   GFRAA 45 (L) 04/27/2015 1103    CBC    Component Value Date/Time   WBC 11.5 (H) 08/13/2016 2156   RBC 3.97 08/13/2016 2156   HGB 12.6 08/13/2016 2156   HCT 39.1 08/13/2016 2156   PLT 209 08/13/2016 2156   MCV 98.5 08/13/2016 2156   MCH 31.7 08/13/2016 2156   MCHC 32.2 08/13/2016 2156   RDW 14.4 08/13/2016 2156   LYMPHSABS 1.2 08/13/2016 2156   MONOABS 0.8 08/13/2016 2156   EOSABS 0.0 08/13/2016 2156   BASOSABS 0.0 08/13/2016 2156    EKG: none  CXR: none  Assessment & Plan by Problem: Diverticulitis Patient has had a 2 day history of nausea, subjective fever/chills with diffuse uncontrolled stabbing sharp pain in lower abdomen.  She is afebrile with mildly elevated leukocytosis.  No diverticulosis was noted on her colonoscopy in 2007.  Today CT of abdomen showed inflammatory changes and wall thickening in the sigmoid colon most consistent with acute diverticulitis. Patient has allergy to metronidazole experiencing bad dreams, visual hallucinations.  She will be placed on unasyn. - NPO - IV fluids - IV unasyn  - IV morphine 2mg  Q3H PRN - tylenol 650mg  Q6H PRN - senna-docusate and Miralax - Outpatient evaluation of colon after resolution of acute process is recommended to exclude any underlying colon cancer.  Hypertension Blood pressure is stable 116/62.  She takes hydrochlorothiazide 25mg  daily - Holding  home blood pressure medication - Monitor and restart as necessary   Diabetes Mellitus type II Blood glucose 140.  She takes pioglitazone 30mg  daily - Holding home diabetes medication. - Monitor  Chronic kidney disease stage III GFR of 53 and creatinineat 1.17.  Baseline appears around 1.05. - IV NS - BMET in morning   Diet: clear liquid DVT prophylaxis: lovenox Code:  Full code status    Dispo: Admit patient to Observation with expected length of stay less than 2 midnights.  Signed: Valinda Party, DO 08/14/2016, 4:31 AM  Pager: [phone]

## 2016-08-14 NOTE — ED Notes (Signed)
MD at bedside. 

## 2016-08-14 NOTE — ED Notes (Signed)
Pt and family expressed concern to nurse that while she is recovering and taking the prescribed antibiotics on an outpatient basis her pain will increase until she feels the need to return to ED.  Pt states that she would feel more comfortable if she were admitted.  MD notified.

## 2016-08-14 NOTE — ED Notes (Signed)
Report attempted.  Nurse to call back.

## 2016-08-14 NOTE — ED Notes (Signed)
Report given to 5W - 

## 2016-08-14 NOTE — Progress Notes (Signed)
Subjective: [name] says her pain improved significantly in response to pain medications. She states her last bowel movement was yesterday (10/1) morning and she denies any recent changes in stool consistency or color. She says that she would've rated greater than 10/10 before she got to the ED but now it is 6/10. She does continue to have burning pain with urination. Although she was vomiting and nauseous prior to presentation she feels ready to try eating solid foods today.  Objective:  Vital signs in last 24 hours: Vitals:   08/14/16 0757 08/14/16 0830 08/14/16 0914 08/14/16 1448  BP: (!) 109/52 118/57 (!) 107/55 (!) 141/53  Pulse: 72 65 73 73  Resp: 16 16 17 17   Temp:   98.6 F (37 C) 100.2 F (37.9 C)  TempSrc:   Oral Oral  SpO2: 100% 99% 98% 99%  Weight:   185 lb 13.6 oz (84.3 kg)   Height:       Physical Exam  Constitutional: She appears well-developed and well-nourished. No distress.  Cardiovascular: Normal rate and regular rhythm.   No murmur heard. Pulmonary/Chest: Breath sounds normal. She has no wheezes. She has no rales.  Abdominal: Soft. Bowel sounds are normal. She exhibits no distension. There is tenderness. There is no guarding.  Tenderness in left lower quadrant   Extremities: no calf tenderness, bilateral pitting edema   Labs: CBC:  Recent Labs Lab 08/13/16 2156 08/14/16 0517  WBC 11.5* 11.6*  NEUTROABS 9.5*  --   HGB 12.6 11.7*  HCT 39.1 36.9  MCV 98.5 97.6  PLT [id number]   Metabolic Panel:  Recent Labs Lab 08/13/16 2156 08/14/16 0517  NA 138 139  K 3.5 3.5  CL 100* 106  CO2 27 24  GLUCOSE 140* 138*  BUN 15 13  CREATININE 1.17* 1.07*  CALCIUM 10.0 9.3  ALT 15  --   ALKPHOS 66  --   BILITOT 1.0  --   PROT 7.8  --   ALBUMIN 4.1  --     Medications: Infusions:   Scheduled Medications: . ampicillin-sulbactam (UNASYN) IV  3 g Intravenous Q6H  . aspirin EC  81 mg Oral Daily  . enoxaparin (LOVENOX) injection  40 mg  Subcutaneous Daily  . senna-docusate  1 tablet Oral QHS   PRN Medications: acetaminophen **OR** acetaminophen, ketorolac, morphine injection, polyethylene glycol, promethazine  Assessment/Plan: Pt is a 70 y.o. yo female with a PMHx of hypertension, type 2 diabetes, GERD, hyperlipidemia who was admitted on 08/13/2016 with symptoms of abdominal pain, which was determined to be secondary to diverticulitis.   Active Problems:   Diverticulitis  Acute diverticulitis Dr. Benson Norway who performed her prior colonoscopy was contacted. He states that he had noticed some diverticuli in the patients ascending colon at the time of her colonoscopy in march 2017. He agrees that this presentation is most likely related to diverticulitis and agrees with the current management of pain medications and antibiotics. Today she remains afebrile with elevated leukocyte count. Advanced diet to liquids which she tolerated without nausea or vomiting, will advance to regular diet.  -continue IV ampicillin-sulbactam  -continue IV morphine q3h PRN and tylenol 650 mg q6h prn  -continue senna-docusate and miralax   Hypertension She remains normotensive. Home medication is HCTZ 25mg  daily which will be restarted if needed.   Type 2 DM Glucose on BMET have been 130-140s. Home medication is pioglitazone 30 mg. She was not tolerating full diet at the time of admission but has  been started on regular diet for dinner tonight. We will continue to monitor and start CBG and ISS as needed.    acute on CKD stage III  Creatinine 1.17 on admission with baseline Crt of 1.05 . Crt improved to 1.07 today after IV fluids, acute injury may have been related to dehydration.  -follow up repeat BMET   dispo Anticipated discharge in approximately 1-2 day(s).   LOS: 0 days   Ledell Noss, MD 08/14/2016, 5:40 PM Pager: [phone]

## 2016-08-15 DIAGNOSIS — K5732 Diverticulitis of large intestine without perforation or abscess without bleeding: Principal | ICD-10-CM

## 2016-08-15 DIAGNOSIS — N189 Chronic kidney disease, unspecified: Secondary | ICD-10-CM

## 2016-08-15 DIAGNOSIS — R300 Dysuria: Secondary | ICD-10-CM

## 2016-08-15 LAB — BASIC METABOLIC PANEL
ANION GAP: 8 (ref 5–15)
BUN: 12 mg/dL (ref 6–20)
CALCIUM: 8.9 mg/dL (ref 8.9–10.3)
CHLORIDE: 104 mmol/L (ref 101–111)
CO2: 29 mmol/L (ref 22–32)
Creatinine, Ser: 1.07 mg/dL — ABNORMAL HIGH (ref 0.44–1.00)
GFR calc non Af Amer: 51 mL/min — ABNORMAL LOW (ref >60)
GFR, EST AFRICAN AMERICAN: 60 mL/min — AB (ref >60)
GLUCOSE: 106 mg/dL — AB (ref 65–99)
POTASSIUM: 3.2 mmol/L — AB (ref 3.5–5.1)
Sodium: 141 mmol/L (ref 135–145)

## 2016-08-15 LAB — CBC
HEMATOCRIT: 34.1 % — AB (ref 36.0–46.0)
HEMOGLOBIN: 10.7 g/dL — AB (ref 12.0–15.0)
MCH: 30.9 pg (ref 26.0–34.0)
MCHC: 31.4 g/dL (ref 30.0–36.0)
MCV: 98.6 fL (ref 78.0–100.0)
Platelets: 171 10*3/uL (ref 150–400)
RBC: 3.46 MIL/uL — AB (ref 3.87–5.11)
RDW: 14.8 % (ref 11.5–15.5)
WBC: 8.6 10*3/uL (ref 4.0–10.5)

## 2016-08-15 LAB — URINE CULTURE

## 2016-08-15 MED ORDER — AMOXICILLIN-POT CLAVULANATE 875-125 MG PO TABS: 1.0000 | ORAL_TABLET | Freq: Two times a day (BID) | ORAL | 0 refills | Status: DC

## 2016-08-15 MED ORDER — POTASSIUM CHLORIDE CRYS ER 20 MEQ PO TBCR
40.0000 meq | EXTENDED_RELEASE_TABLET | Freq: Once | ORAL | Status: AC
  Administered 2016-08-15: 40 meq via ORAL
  Filled 2016-08-15: qty 2

## 2016-08-15 MED ORDER — FLUCONAZOLE 100 MG PO TABS: 100.0000 mg | ORAL_TABLET | Freq: Every day | ORAL | 0 refills | Status: DC

## 2016-08-15 NOTE — Discharge Instructions (Signed)
Thank you for trusting [place] with your medical care!  You were hospitalized for diverticulitis and treated with unasyn.   Please take note of the following changes to your medications: Start taking augmentin twice daily, you will finish these pills on 10/11 Take the fluconazole pill on the day that you finish augmentin   To make sure you are getting better, please make it to the follow-up appointments listed on the first page.  If you have any questions, please call [phone].  Diverticulitis Diverticulitis is when small pockets that have formed in your colon (large intestine) become infected or swollen. HOME CARE  Follow your doctor's instructions.  Follow a special diet if told by your doctor.  When you feel better, your doctor may tell you to change your diet. You may be told to eat a lot of fiber. Fruits and vegetables are good sources of fiber. Fiber makes it easier to poop (have bowel movements).  Take supplements or probiotics as told by your doctor.  Only take medicines as told by your doctor.  Keep all follow-up visits with your doctor. GET HELP IF:  Your pain does not get better.  You have a hard time eating food.  You are not pooping like normal. GET HELP RIGHT AWAY IF:  Your pain gets worse.  Your problems do not get better.  Your problems suddenly get worse.  You have a fever.  You keep throwing up (vomiting).  You have bloody or black, tarry poop (stool). MAKE SURE YOU:   Understand these instructions.  Will watch your condition.  Will get help right away if you are not doing well or get worse.   This information is not intended to replace advice given to you by your health care provider. Make sure you discuss any questions you have with your health care provider.   Document Released: 04/17/2008 Document Revised: 11/04/2013 Document Reviewed: 09/24/2013 Elsevier Interactive Patient Education [employer].

## 2016-08-15 NOTE — Discharge Summary (Signed)
Name: [name] MRN: [id number] DOB: [birth year] 70 y.o. PCP: Axel Filler, MD  Date of Admission: 08/13/2016 10:59 PM Date of Discharge: 08/15/2016 Attending Physician: Dr. Michel Bickers Discharge Diagnosis: Acute diverticulitis  Presented with abdominal pain, nausea, vomiting, and subjective fever   CT abdomen showed sigmoid colon wall thickening consistent with acute diverticulitis  Abdominal pain improved significantly after one dose of morphine   Received 2 days unasyn transitioned to augmentin with stop date 10/11 Dysuria   Started at the time of abdominal pain, thought to be related to local inflammation   Will need follow up if this doesn't resolve   Discharge Medications:   Medication List    TAKE these medications   amoxicillin-clavulanate 875-125 MG tablet Commonly known as:  AUGMENTIN Take 1 tablet by mouth 2 (two) times daily. Stop date 10/11   aspirin 81 MG chewable tablet Chew 81 mg by mouth daily.   fluconazole 100 MG tablet Commonly known as:  DIFLUCAN Take 1 tablet (100 mg total) by mouth daily. Take this pill the day that you finish your antibiotic augmentin   glucose blood test strip Check blood sugar twice a day before breakfast and before dinner   hydrochlorothiazide 25 MG tablet Commonly known as:  HYDRODIURIL TAKE 1 TABLET BY MOUTH EVERY DAY   pioglitazone 30 MG tablet Commonly known as:  ACTOS Take 1 tablet (30 mg total) by mouth daily.   simvastatin 40 MG tablet Commonly known as:  ZOCOR TAKE 1 TABLET BY MOUTH AT BEDTIME      Disposition and follow-up:   [name] was discharged from [facility] in Good condition. At the hospital follow up visit please address:  1.  Has abdominal pain resolved? Has dysuria improved? Was able to pick up her antibiotics from the pharmacy?  2.  Labs / imaging needed at time of follow-up: BMET   3.  Pending labs/ test needing follow-up: none   Follow-up  Appointments: Follow-up Information    [place]. Call on 08/18/2016.   Why:  you have an appointment scheduled at 9:45 Contact information: [address] [place], [place]. Call in 1 day(s).   Specialty:  Gastroenterology Why:  call to make a follow up appointment with dr. hung as soon as possible  Contact information: [address] [place] [address] [phone]           Hospital Course by problem list: Active Problems:   Diabetes type 2, controlled ([place])   Essential hypertension   Acute diverticulitis   Chronic kidney disease   Acute Diverticulitis  [name] is a 70 year old female with PMHx of diabetes mellitus type II, hypertension and GERD that presents with progressive lower abdominal pain for the past two days.  The abdominal pain was 10/10 and associated with nausea and vomiting, fever, and chills. She reports that she has normal bowel movements in the last 1 was morning prior to admission. In the ED CT abdomen and pelvis showed inflammatory changes and sigmoid colon wall thickening consistent with acute diverticulitis. She had had a colonoscopy performed 01/2016 with benign polyps removed. Dr. Benson Norway performed this colonoscopy, he was contacted and he reported that he had noticed diverticulosis in her ascending colon at the time of colonoscopy. She was started on unasyn and received one dose of morphine in the ED and her pain improved to  7/10 by the morning after admission. She received a total days of Unasyn this was then transitioned to Augmentin with plans for a 10 day total antibiotic course stop date 10/11. On the morning of discharged she had been advanced to regular diet and her pain had improved to 3/10.   Dysuria  She described vaginal burning with urination which had started on the same day as the abdominal pain. Later in the hospitalization stated that the  vaginal pain was improving with improvement of her abdominal pain but she developed a difficult to describe pulling sensation from her umbilicus. She had no appreciable abdominal masses on exam and urinalysis was not suspicious for urinary tract infection. Her last pap smear was 06/2012 and negative for malignancy. She denied vaginal bleeding or discharge and denies any history of sexually transmitted disease or abnormal pap smear. We were not clear on the etiology of these symptoms however they may be related to the local effects of colonic inflammation. We discussed outpatient follow up if these symptoms do not resolve as her abdominal pain improves.  Her daughter is a Marine scientist and ask for a fluconazole prescription for yeast infection prophylaxis after this Augmentin course.  Hypertension She remained normotensive and home medication HCTZ 25mg  daily was held.   Type 2 DM Glucose on BMETs 130-140s with slow advancing to regular diet so home medication is pioglitazone 30 mg was held   Acute on chronic kidney disease stage VI Creatinine 1.17 on admission with baseline Crt of 1.05 . Crt improved to 1.07 after IV fluids, acute injury may have been related to dehydration and vomiting.   Discharge Vitals:   BP (!) 124/56 (BP Location: Right Arm)   Pulse 71   Temp 98.7 F (37.1 C) (Oral)   Resp 20   Ht 5\' 5"  (1.651 m)   Wt 185 lb 13.6 oz (84.3 kg)   LMP 04/10/1996   SpO2 98%   BMI 30.93 kg/m   Procedures Performed:  Ct Abdomen Pelvis Wo Contrast  Result Date: 08/14/2016 CLINICAL DATA:  Diffuse abdominal pain with severe nausea for 2 days. EXAM: CT ABDOMEN AND PELVIS WITHOUT CONTRAST TECHNIQUE: Multidetector CT imaging of the abdomen and pelvis was performed following the standard protocol without IV contrast. COMPARISON:  None. FINDINGS: Lower chest: Mild dependent changes in the lung bases. Small pericardial effusion. Hepatobiliary: Unenhanced appearance is unremarkable. Pancreas: Unenhanced  appearance is unremarkable. Spleen: Unenhanced appearance is unremarkable. Adrenals/Urinary Tract: Unenhanced appearance is unremarkable. No renal or ureteral stones. No hydronephrosis or hydroureter. Stomach/Bowel: Stomach and small bowel are decompressed. Scattered gas and stool throughout the colon. No colonic distention. Scattered diverticula are demonstrated in the sigmoid colon. There is inflammatory stranding around the upper sigmoid colon with associated wall thickening. This most likely represents acute diverticulitis. Area of wall thickening demonstrates a fairly abrupt transition. Follow-up evaluation of the colon after resolution of acute process is suggested to exclude underlying colon cancer. No abscess. Vascular/Lymphatic: Aortic atherosclerosis. No enlarged abdominal or pelvic lymph nodes. Reproductive: Uterus and bilateral adnexa are unremarkable. Other: Abdominal wall musculature appears intact. No free air or free fluid in the abdomen. Musculoskeletal: Degenerative changes in the spine. No destructive bone lesions. IMPRESSION: Inflammatory changes and wall thickening demonstrated in the sigmoid colon most consistent with acute diverticulitis. No abscess. Wall thickening demonstrates a somewhat abrupt transition. Evaluation of the colon after resolution of the acute process is recommended to exclude an underlying colon cancer. Electronically Signed   By: Oren Beckmann.D.  On: 08/14/2016 01:58   Discharge Instructions: Discharge Instructions    Call MD for:  persistant nausea and vomiting    Complete by:  As directed    Call MD for:  severe uncontrolled pain    Complete by:  As directed    Call MD for:  temperature >100.4    Complete by:  As directed    Diet - low sodium heart healthy    Complete by:  As directed    Increase activity slowly    Complete by:  As directed       Signed: Ledell Noss, MD 08/16/2016, 3:27 PM   Pager: [phone]

## 2016-08-15 NOTE — Progress Notes (Signed)
Subjective: [name] says her abdominal pain has improved significantly, it is 3/10 today and she required no pain medications overnight. She does continue to experience some dysuria and a "pulling" sensation when she urinates. She is experiencing vaginal burning when she urinates as well, she says this began the day that her abdominal pain started and has been improving with the abdominal pain. She does not remember when she had her last gynecology exam and pap smear. She denies any vaginal discharge or changes in urination. She says all of the symptoms which started prior to discharge continue to improve and she is no longer having nausea or vomiting after she eats.   Objective:  Vital signs in last 24 hours: Vitals:   08/14/16 0914 08/14/16 1448 08/14/16 2109 08/15/16 0534  BP: (!) 107/55 (!) 141/53 (!) 129/53 (!) 119/51  Pulse: 73 73 83 75  Resp: 17 17 20 18   Temp: 98.6 F (37 C) 100.2 F (37.9 C) 98.9 F (37.2 C) 99 F (37.2 C)  TempSrc: Oral Oral Oral Oral  SpO2: 98% 99% 99% 96%  Weight: 185 lb 13.6 oz (84.3 kg)     Height:       Physical Exam  Constitutional: No distress.  Cardiovascular: Normal rate and regular rhythm.   No murmur heard. Pulmonary/Chest: Breath sounds normal. She has no wheezes. She has no rales.  Abdominal: Soft. Bowel sounds are normal. She exhibits no distension. There is tenderness. There is guarding.  Periumbilical and right lower quadrant abdominal tenderness  Extremities: no calf tenderness, no peripheral edema   Labs: CBC:  Recent Labs Lab 08/13/16 2156 08/14/16 0517 08/15/16 0728  WBC 11.5* 11.6* 8.6  NEUTROABS 9.5*  --   --   HGB 12.6 11.7* 10.7*  HCT 39.1 36.9 34.1*  MCV 98.5 97.6 98.6  PLT 209 [id number]   Metabolic Panel:  Recent Labs Lab 08/13/16 2156 08/14/16 0517 08/15/16 0444  NA 138 139 141  K 3.5 3.5 3.2*  CL 100* 106 104  CO2 27 24 29   GLUCOSE 140* 138* 106*  BUN 15 13 12   CREATININE 1.17* 1.07* 1.07*    CALCIUM 10.0 9.3 8.9  ALT 15  --   --   ALKPHOS 66  --   --   BILITOT 1.0  --   --   PROT 7.8  --   --   ALBUMIN 4.1  --   --     Medications:   Scheduled Medications: . ampicillin-sulbactam (UNASYN) IV  3 g Intravenous Q6H  . aspirin EC  81 mg Oral Daily  . enoxaparin (LOVENOX) injection  40 mg Subcutaneous Daily  . senna-docusate  1 tablet Oral QHS   PRN Medications: acetaminophen **OR** acetaminophen, ketorolac, morphine injection, polyethylene glycol, promethazine  Assessment/Plan: Pt is a 70 y.o. yo female with a PMHx of hypertension, type 2 diabetes, GERD, hyperlipidemia who was admitted on 08/13/2016 with symptoms of abdominal pain, which was determined to be secondary to diverticulitis.   Active Problems:   Diabetes type 2, controlled ([place])   Essential hypertension   Acute diverticulitis   Chronic kidney disease  Acute diverticulitis Today she remains afebrile with elevated leukocyte count improving. Overnight she tolerated regular diet. Dysuria and vaginal pain continue to improve with improvement of her abdominal pain, these will require outpatient follow up if they do not resolve.  -transition IV ampicillin-sulbactam to amoxicillin- clavulanate with stop date 10/11 -continue IV morphine q3h PRN and tylenol 650 mg q6h  prn she has not needed any pain medications overnight   -continue senna-docusate and miralax   Hypertension She remains normotensive. Home medication is HCTZ 25mg  daily.  Type 2 DM Glucose on BMET have been 130-140s. Home medication is pioglitazone 30 mg. She was not tolerating full diet at the time of admission but has been started on regular diet for dinner tonight. We will continue to monitor and start CBG and ISS as needed.    acute on CKD stage III  Creatinine 1.17 on admission with baseline Crt of 1.05 . Crt improved to 1.07 today after IV fluids, acute injury may have been related to dehydration.   Dispo: Anticipated discharge today  LOS: 1  day   Ledell Noss, MD 08/15/2016, 1:38 PM Pager: [phone]

## 2016-08-15 NOTE — Care Management Note (Signed)
Case Management Note  Patient Details  Name: AALYA BADY MRN: [id number] Date of Birth: [birth year]  Subjective/Objective:                 Patient admitted with diverticulitis. IV abx. Advanced diet.    Action/Plan:  DC to home Expected Discharge Date:                  Expected Discharge Plan:  Home/Self Care  In-House Referral:  NA  Discharge planning Services  CM Consult  Post Acute Care Choice:  NA Choice offered to:  NA  DME Arranged:  N/A DME Agency:  NA  HH Arranged:  NA HH Agency:  NA  Status of Service:  Completed, signed off  If discussed at [place] of Stay Meetings, dates discussed:    Additional Comments:  Carles Collet, RN 08/15/2016, 1:03 PM

## 2016-08-15 NOTE — Progress Notes (Signed)
D/C instructions reviewed with pt, copy of instructions given to pt, pt scripts sent into her pharmacy by MD, pt d/c'd with belongings with family, she declined going out in a wheelchair, wanted to walk out with family. Pt has steady gait.

## 2016-08-18 ENCOUNTER — Ambulatory Visit (INDEPENDENT_AMBULATORY_CARE_PROVIDER_SITE_OTHER): Payer: BC Managed Care – PPO | Admitting: Internal Medicine

## 2016-08-18 ENCOUNTER — Other Ambulatory Visit (HOSPITAL_COMMUNITY)
Admission: RE | Admit: 2016-08-18 | Discharge: 2016-08-18 | Disposition: A | Discharge status: Home / Self Care | Payer: BC Managed Care – PPO | Source: Ambulatory Visit | Attending: Student in an Organized Health Care Education/Training Program | Admitting: Student in an Organized Health Care Education/Training Program

## 2016-08-18 ENCOUNTER — Encounter: Payer: Self-pay | Admitting: Internal Medicine

## 2016-08-18 VITALS — BP 140/60 | HR 71 | Temp 98.2°F | Ht 65.0 in | Wt 189.6 lb

## 2016-08-18 DIAGNOSIS — Z5189 Encounter for other specified aftercare: Secondary | ICD-10-CM | POA: Diagnosis not present

## 2016-08-18 DIAGNOSIS — N950 Postmenopausal bleeding: Secondary | ICD-10-CM

## 2016-08-18 DIAGNOSIS — R300 Dysuria: Secondary | ICD-10-CM

## 2016-08-18 DIAGNOSIS — K5792 Diverticulitis of intestine, part unspecified, without perforation or abscess without bleeding: Secondary | ICD-10-CM | POA: Diagnosis not present

## 2016-08-18 DIAGNOSIS — Z124 Encounter for screening for malignant neoplasm of cervix: Secondary | ICD-10-CM | POA: Diagnosis present

## 2016-08-18 NOTE — Patient Instructions (Addendum)
Ms. Krupnick it was nice meeting you today.   -Continue taking Augmentin  -You may try eating yogurt or using an over-the-counter probiotic supplement for loose stools   -I have referred you to Gynecology. Please make sure you make an appointment with them as soon as possible.

## 2016-08-19 NOTE — Progress Notes (Signed)
   CC: Pt is here for a hospital f/u of acute diverticulitis and dysuria.   HPI:  [name] is a 70 y.o. F with a PMHx of conditions listed below presenting for a hospital f/u of acute diverticulitis and dysuria. Pt also reports noticing black colored stains on her underwear. Please see problem based charting for the status of the patient's current and chronic medical conditions.   Past Medical History:  Diagnosis Date  . Diabetes mellitus   . GERD (gastroesophageal reflux disease)   . HLD (hyperlipidemia)   . Hypertension     Review of Systems:  Pertinent positives mentioned in HPI. Remainder of all ROS negative.   Physical Exam:  Vitals:   08/18/16 0959  BP: 140/60  Pulse: 71  Temp: 98.2 F (36.8 C)  TempSrc: Oral  SpO2: 95%  Weight: 189 lb 9.6 oz (86 kg)  Height: 5\' 5"  (1.651 m)   Physical Exam  Constitutional: She is oriented to person, place, and time. She appears well-developed and well-nourished. No distress.  HENT:  Head: Normocephalic and atraumatic.  Eyes: EOM are normal.  Neck: Neck supple. No tracheal deviation present.  Cardiovascular: Normal rate, regular rhythm and intact distal pulses.   Pulmonary/Chest: Effort normal and breath sounds normal. No respiratory distress.  Abdominal: Soft. Bowel sounds are normal. She exhibits no distension. There is no rebound and no guarding.  R and L lower quadrants mildly tender to palpation.   Genitourinary:  Genitourinary Comments: Cervix could not be visualized. No discharge or bleeding noted in the vaginal vault.   Musculoskeletal: Normal range of motion. She exhibits no edema.  Neurological: She is alert and oriented to person, place, and time.  Skin: Skin is warm and dry.    Assessment & Plan:   See Encounters Tab for problem based charting.  Patient discussed with Dr. Dareen Piano

## 2016-08-20 DIAGNOSIS — N950 Postmenopausal bleeding: Secondary | ICD-10-CM | POA: Insufficient documentation

## 2016-08-20 DIAGNOSIS — R300 Dysuria: Secondary | ICD-10-CM | POA: Insufficient documentation

## 2016-08-20 NOTE — Assessment & Plan Note (Signed)
HPI Patient complained of vaginal burning with urination and vaginal pain during recent hospitalization which was thought to be related to her abdominal symptoms as urinalysis was not suggestive of an infection and patient was not complaining of any vaginal discharge or bleeding. She was discharged with a prescription for Fluconazole for yeast infection prophylaxis as she is taking Augmentin for acute diverticulitis.  At present, patient denies having any vaginal pain or burning. Reports having mild lower abdominal discomfort when she urinates. Denies having any burning with urination, urinary frequency, or urgency.   A UTI less likely as she is currently on augmentin.   P -CTM

## 2016-08-20 NOTE — Assessment & Plan Note (Addendum)
HPI Patient was recently admitted for acute diverticulitis. She was discharged home with a course of Augmentin which she is currently taking. At present, patient reports having minimal lower abdominal discomfort. Reports feeling nauseous but denies any episodes of vomiting. States she is able to tolerate PO intake well. Denies having any fevers or chills. Reports having loose stools. Reports noticing blood on the toilet paper when she wipes for the past 1 year. Denies having any hematochezia or melena.   A Acute diverticuliitis. Pt noticing blood when wiping could possibly be 2/2 diverticulosis vs hemorrhoids. Colonoscopy done 01/2016 showing sessile poyps which were resected at that time. Hospital discharge summary mentions the primary team contacted Dr. Benson Norway and he reported that he had noticed diverticulosis in her ascending colon at the time of the colonoscopy. Loose stools likely 2/2 diminised normal gut flora from antibiotic use.   P -Advised pt to finish her course of Augmentin (stop date 10/11) -Advised she take OTC probiotic supplement or eat yogurt to replenish healthy gut flora  -RTC in 4 weeks

## 2016-08-20 NOTE — Assessment & Plan Note (Addendum)
HPI Pt reports noticing black colored stains on her underwear for the past 1 year. Denies noticing any vaginal bleeding or discharge. Pap smear done 06/2012 was negative for intraepithelial lesions or malignancy.  A Postmenopausal bleeding. Differentials include polyps, endometrial hyperplasia, endometrial atrophy, and cervical cancer. Pap smear was attempted at this visit but cervix could not be visualized. No vaginal bleeding or discharge noted.  P -Referral to gynecology for post-menopausal bleeding   Addendum 08/22/16 at 1:13 pm: Pap smear (vaginal): Negative for intraepithelial lesions or malignancy. Tried calling patient but could not reach her over the phone.

## 2016-08-21 NOTE — Progress Notes (Addendum)
Internal Medicine Clinic Attending  Case discussed with Dr. Rathoreat the time of the visit. We reviewed the resident's history and exam and pertinent patient test results. I agree with the assessment, diagnosis, and plan of care documented in the resident's note.  

## 2016-08-22 LAB — CYTOLOGY - PAP

## 2016-09-04 ENCOUNTER — Encounter: Payer: Self-pay | Admitting: Student in an Organized Health Care Education/Training Program

## 2016-09-22 ENCOUNTER — Other Ambulatory Visit: Payer: Self-pay

## 2016-09-22 DIAGNOSIS — N938 Other specified abnormal uterine and vaginal bleeding: Secondary | ICD-10-CM

## 2016-09-22 NOTE — Progress Notes (Unsigned)
Per provider pt needs [place] scheduled prior to GYN appt.  [place] scheduled November 27th @ 0800.  [place] office to notify pt of [place] and GYN appt.

## 2016-10-09 ENCOUNTER — Ambulatory Visit (HOSPITAL_COMMUNITY): Payer: BC Managed Care – PPO

## 2016-10-13 ENCOUNTER — Ambulatory Visit (HOSPITAL_COMMUNITY)
Admission: RE | Admit: 2016-10-13 | Discharge: 2016-10-13 | Disposition: A | Discharge status: Home / Self Care | Payer: BC Managed Care – PPO | Source: Ambulatory Visit | Attending: Family Medicine | Admitting: Family Medicine

## 2016-10-13 DIAGNOSIS — N939 Abnormal uterine and vaginal bleeding, unspecified: Secondary | ICD-10-CM | POA: Diagnosis not present

## 2016-10-13 DIAGNOSIS — N938 Other specified abnormal uterine and vaginal bleeding: Secondary | ICD-10-CM

## 2016-10-23 ENCOUNTER — Encounter: Payer: BC Managed Care – PPO | Admitting: Obstetrics & Gynecology

## 2016-10-30 NOTE — Addendum Note (Signed)
Addended by: Hulan Fray on: 10/30/2016 05:51 PM   Modules accepted: Orders

## 2017-03-10 ENCOUNTER — Encounter (HOSPITAL_COMMUNITY): Payer: Self-pay | Admitting: Emergency Medicine

## 2017-03-10 ENCOUNTER — Ambulatory Visit (HOSPITAL_COMMUNITY)
Admission: EM | Admit: 2017-03-10 | Discharge: 2017-03-10 | Disposition: A | Discharge status: Home / Self Care | Payer: Medicare PPO | Attending: Family Medicine | Admitting: Family Medicine

## 2017-03-10 DIAGNOSIS — J309 Allergic rhinitis, unspecified: Secondary | ICD-10-CM

## 2017-03-10 MED ORDER — FLUTICASONE PROPIONATE 50 MCG/ACT NA SUSP: 2.0000 | Freq: Every day | NASAL | 2 refills | Status: DC

## 2017-03-10 MED ORDER — CETIRIZINE HCL 10 MG PO TABS: 10.0000 mg | ORAL_TABLET | Freq: Every day | ORAL | 1 refills | Status: DC

## 2017-03-10 NOTE — ED Triage Notes (Signed)
Here for cold/allergy sx onset 3 days associated w/prod cough, CP due to cough, scratchy throat   A&O x4... NAD

## 2017-03-10 NOTE — Discharge Instructions (Signed)
Continue to push fluids, practice good hand hygiene, and cover your mouth if you cough.  If you start having fevers, shaking or shortness of breath, seek immediate care.  Flonase (fluticasone); nasal spray- 2 sprays each nostril, once daily. Aim towards the same side eye when you spray.

## 2017-03-10 NOTE — ED Provider Notes (Signed)
[facility]    CSN: [id number] Arrival date & time: 03/10/17  1458     History   Chief Complaint Chief Complaint  Patient presents with  . URI    HPI [name] is a 71 y.o. female.   HPI  Duration: 3 days  Associated symptoms: chest tightness, itchy/watery eyes, runny nose, congestion, and scratchy throat Denies: sinus pain, ear pain, ear drainage, sore throat, wheezing, shortness of breath, myalgia and fevers/rigors Treatment to date: None Sick contacts: No    Past Medical History:  Diagnosis Date  . Diabetes mellitus   . GERD (gastroesophageal reflux disease)   . HLD (hyperlipidemia)   . Hypertension     Patient Active Problem List   Diagnosis Date Noted  . Post-menopausal bleeding 08/20/2016  . Dysuria 08/20/2016  . Chronic kidney disease 08/15/2016  . Acute diverticulitis 08/14/2016  . Routine health maintenance 03/20/2012  . Diabetes type 2, controlled ([place]) 09/06/2007  . Dyslipidemia 09/06/2007  . Essential hypertension 09/06/2007  . GERD 08/15/2006    History reviewed. No pertinent surgical history.   Home Medications    Prior to Admission medications   Medication Sig Start Date End Date Taking? Authorizing Provider  aspirin 81 MG chewable tablet Chew 81 mg by mouth daily.     Yes Historical Provider, MD  hydrochlorothiazide (HYDRODIURIL) 25 MG tablet TAKE 1 TABLET BY MOUTH EVERY DAY 05/15/16  Yes Axel Filler, MD  pioglitazone (ACTOS) 30 MG tablet Take 1 tablet (30 mg total) by mouth daily. 03/06/16  Yes Oval Linsey, MD  simvastatin (ZOCOR) 40 MG tablet TAKE 1 TABLET BY MOUTH AT BEDTIME 05/15/16  Yes Axel Filler, MD  cetirizine (ZYRTEC ALLERGY) 10 MG tablet Take 1 tablet (10 mg total) by mouth daily. 03/10/17   Crosby Oyster Christinamarie Tall, DO  fluticasone [facility]) 50 MCG/ACT nasal spray Place 2 sprays into both nostrils daily. 03/10/17   Shelda Pal, DO  glucose blood test strip Check blood sugar twice a day  before breakfast and before dinner 03/20/12   Thera Flake, MD    Family History Family History  Problem Relation Age of Onset  . Stroke Neg Hx   . Heart disease Neg Hx   . Cancer Neg Hx     Social History Social History  Substance Use Topics  . Smoking status: Never Smoker  . Smokeless tobacco: Never Used  . Alcohol use No     Allergies   Metformin   Review of Systems Review of Systems  Constitutional: Negative for fever.  Respiratory: Positive for cough.      Physical Exam Triage Vital Signs ED Triage Vitals [03/10/17 1541]  Enc Vitals Group     BP (!) 131/59     Pulse Rate 64     Resp 18     Temp 98.6 F (37 C)     Temp Source Oral     SpO2 100 %   Updated Vital Signs BP (!) 131/59 (BP Location: Right Arm)   Pulse 64   Temp 98.6 F (37 C) (Oral)   Resp 18   LMP 04/10/1996   SpO2 100%   Physical Exam  Constitutional: She appears well-developed and well-nourished.  HENT:  Head: Normocephalic and atraumatic.  Right Ear: External ear normal.  Left Ear: External ear normal.  Mouth/Throat: Oropharynx is clear and moist. No oropharyngeal exudate.  +rhinorrhea, turbinates enlarged b/l, worse on L, pale, boggy  Neck: Normal range of motion. Neck supple.  Cardiovascular:  Normal rate and regular rhythm.   No murmur heard. Pulmonary/Chest: Effort normal and breath sounds normal. No respiratory distress.  Skin: Skin is warm and dry.  Psychiatric: She has a normal mood and affect. Judgment normal.     UC Treatments / Results  Procedures Procedures none  Initial Impression / Assessment and Plan / UC Course  I have reviewed the triage vital signs and the nursing notes.  Pertinent labs & imaging results that were available during my care of the patient were reviewed by me and considered in my medical decision making (see chart for details).     S/S's consistent with allergies. Will tx accordingly. No concerns for bacterial infection given hx and PE.  Instructed how to properly use INCS. F/u with PCP if symptoms fail to improve. The patient voiced understanding and agreement to the plan.  Final Clinical Impressions(s) / UC Diagnoses   Final diagnoses:  Allergic rhinitis, unspecified seasonality, unspecified trigger    New Prescriptions New Prescriptions   CETIRIZINE (ZYRTEC ALLERGY) 10 MG TABLET    Take 1 tablet (10 mg total) by mouth daily.   FLUTICASONE (FLONASE) 50 MCG/ACT NASAL SPRAY    Place 2 sprays into both nostrils daily.     [place], [place] 03/10/17 1614

## 2017-05-29 ENCOUNTER — Other Ambulatory Visit: Payer: Self-pay | Admitting: Internal Medicine

## 2017-05-30 NOTE — Telephone Encounter (Signed)
Needs appt with Evette Doffing, not Rathore as scheduled unless she has requested a female physician.  Will refill.  Thanks!

## 2017-06-04 ENCOUNTER — Encounter: Payer: Self-pay | Admitting: Student in an Organized Health Care Education/Training Program

## 2017-06-04 ENCOUNTER — Ambulatory Visit (INDEPENDENT_AMBULATORY_CARE_PROVIDER_SITE_OTHER): Payer: Medicare PPO | Admitting: Student in an Organized Health Care Education/Training Program

## 2017-06-04 VITALS — BP 134/74 | HR 61 | Temp 98.1°F | Ht 65.0 in | Wt 195.6 lb

## 2017-06-04 DIAGNOSIS — Z23 Encounter for immunization: Secondary | ICD-10-CM | POA: Diagnosis not present

## 2017-06-04 DIAGNOSIS — I129 Hypertensive chronic kidney disease with stage 1 through stage 4 chronic kidney disease, or unspecified chronic kidney disease: Secondary | ICD-10-CM

## 2017-06-04 DIAGNOSIS — I10 Essential (primary) hypertension: Secondary | ICD-10-CM

## 2017-06-04 DIAGNOSIS — N182 Chronic kidney disease, stage 2 (mild): Secondary | ICD-10-CM

## 2017-06-04 DIAGNOSIS — E1122 Type 2 diabetes mellitus with diabetic chronic kidney disease: Secondary | ICD-10-CM

## 2017-06-04 DIAGNOSIS — N189 Chronic kidney disease, unspecified: Secondary | ICD-10-CM | POA: Diagnosis not present

## 2017-06-04 DIAGNOSIS — Z0000 Encounter for general adult medical examination without abnormal findings: Secondary | ICD-10-CM | POA: Diagnosis not present

## 2017-06-04 DIAGNOSIS — E785 Hyperlipidemia, unspecified: Secondary | ICD-10-CM | POA: Diagnosis not present

## 2017-06-04 DIAGNOSIS — E119 Type 2 diabetes mellitus without complications: Secondary | ICD-10-CM

## 2017-06-04 LAB — POCT GLYCOSYLATED HEMOGLOBIN (HGB A1C): Hemoglobin A1C: 6.5

## 2017-06-04 LAB — GLUCOSE, CAPILLARY: Glucose-Capillary: 93 mg/dL (ref 65–99)

## 2017-06-04 NOTE — Assessment & Plan Note (Signed)
Diabetes is well-controlled with A1c of 6.5%. She has an allergy listed to metformin with nausea and vomiting. Instead she takes only Actos 30 mg daily and seems to be doing well. Weight is up about 10 pounds over the last 1 year. I think if her weight continues to rise we will talk about switching to a different diabetes agent. Plan is to check urine microalbumin today, if it's positive I would like to start an ACE inhibitor. We will continue with simvastatin and aspirin for primary prevention of ischemic vascular disease. Foot exam was done today and was normal. She reports having ophthalmology exam recently at [facility] and says it was normal.

## 2017-06-04 NOTE — Progress Notes (Signed)
   Assessment and Plan:  See Encounters tab for problem-based medical decision making.   __________________________________________________________  HPI:   71 year old woman here for follow-up of diabetes. This is my first time meeting the patient, previously followed with Dr. Silas [place] and historically has not come to the clinic very frequently. She has no acute complaints today. She reports good compliance with her home medications and denies any side effects. She endorses good exertional capacity without chest pain or shortness of breath. She lives with a 35 year old foster son, is independent in all activities. 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 She had one hospitalization last year for an acute flare of diverticulitis. Has had no further abdominal pain. Eating and drinking well. She does report having some unintentional weight gain recently.  __________________________________________________________  Problem List: Patient Active Problem List   Diagnosis Date Noted  . Diabetes type 2, controlled ([place]) 09/06/2007    Priority: High  . Essential hypertension 09/06/2007    Priority: High  . Chronic kidney disease 08/15/2016    Priority: Medium  . Routine health maintenance 03/20/2012    Priority: Low  . Dyslipidemia 09/06/2007    Priority: Low    Medications: Reconciled today in Epic __________________________________________________________  Physical Exam:  Vital Signs: Vitals:   06/04/17 1016  BP: 140/65  Pulse: 61  Temp: 98.1 F (36.7 C)  TempSrc: Oral  SpO2: 100%  Weight: 195 lb 9.6 oz (88.7 kg)  Height: 5\' 5"  (1.651 m)    Gen: Well appearing, NAD Neck: No cervical LAD, No thyromegaly or nodules, No JVD. CV: RRR, no murmurs Pulm: Normal effort, CTA throughout, no wheezing Ext: Warm, no edema, normal joints Skin: No atypical appearing moles. No rashes

## 2017-06-04 NOTE — Assessment & Plan Note (Signed)
Up-to-date on colon cancer screening, aged out of cervical cancer screening. We gave the patient Tdap vaccine today. She is due for a DEXA scan.

## 2017-06-04 NOTE — Assessment & Plan Note (Signed)
Blood pressure is relatively well controlled today, at the borderline for high. I think if her urine microalbumin comes back positive I would like to start her on an ACE inhibitor.

## 2017-06-05 ENCOUNTER — Encounter: Payer: BC Managed Care – PPO | Admitting: Internal Medicine

## 2017-06-05 LAB — BMP8+ANION GAP
ANION GAP: 14 mmol/L (ref 10.0–18.0)
BUN/Creatinine Ratio: 13 (ref 12–28)
BUN: 13 mg/dL (ref 8–27)
CALCIUM: 9.5 mg/dL (ref 8.7–10.3)
CO2: 25 mmol/L (ref 20–29)
CREATININE: 0.97 mg/dL (ref 0.57–1.00)
Chloride: 103 mmol/L (ref 96–106)
GFR calc Af Amer: 68 mL/min/{1.73_m2} (ref >59)
GFR calc non Af Amer: 59 mL/min/{1.73_m2} — ABNORMAL LOW (ref >59)
Glucose: 99 mg/dL (ref 65–99)
POTASSIUM: 4.2 mmol/L (ref 3.5–5.2)
SODIUM: 142 mmol/L (ref 134–144)

## 2017-06-05 LAB — MICROALBUMIN / CREATININE URINE RATIO
CREATININE, UR: 110.3 mg/dL
MICROALBUM., U, RANDOM: 4.7 ug/mL
Microalb/Creat Ratio: 4.3 mg/g creat (ref 0.0–30.0)

## 2017-06-06 ENCOUNTER — Encounter: Payer: Self-pay | Admitting: Student in an Organized Health Care Education/Training Program

## 2017-07-19 ENCOUNTER — Other Ambulatory Visit: Payer: Self-pay | Admitting: Internal Medicine

## 2017-07-24 ENCOUNTER — Ambulatory Visit: Payer: Medicare PPO

## 2017-07-24 ENCOUNTER — Telehealth: Payer: Self-pay | Admitting: Student in an Organized Health Care Education/Training Program

## 2017-07-24 ENCOUNTER — Ambulatory Visit (INDEPENDENT_AMBULATORY_CARE_PROVIDER_SITE_OTHER): Payer: Medicare PPO | Admitting: *Deleted

## 2017-07-24 DIAGNOSIS — Z23 Encounter for immunization: Secondary | ICD-10-CM

## 2017-07-31 ENCOUNTER — Other Ambulatory Visit: Payer: Self-pay | Admitting: Student in an Organized Health Care Education/Training Program

## 2017-09-24 ENCOUNTER — Ambulatory Visit (INDEPENDENT_AMBULATORY_CARE_PROVIDER_SITE_OTHER): Payer: Medicare PPO | Admitting: Student in an Organized Health Care Education/Training Program

## 2017-09-24 ENCOUNTER — Encounter: Payer: Self-pay | Admitting: Student in an Organized Health Care Education/Training Program

## 2017-09-24 VITALS — BP 123/56 | HR 70 | Temp 98.2°F | Ht 65.0 in | Wt 196.5 lb

## 2017-09-24 DIAGNOSIS — L989 Disorder of the skin and subcutaneous tissue, unspecified: Secondary | ICD-10-CM | POA: Insufficient documentation

## 2017-09-24 DIAGNOSIS — I10 Essential (primary) hypertension: Secondary | ICD-10-CM | POA: Diagnosis not present

## 2017-09-24 DIAGNOSIS — E119 Type 2 diabetes mellitus without complications: Secondary | ICD-10-CM | POA: Diagnosis not present

## 2017-09-24 LAB — POCT GLYCOSYLATED HEMOGLOBIN (HGB A1C): HEMOGLOBIN A1C: 6.9

## 2017-09-24 LAB — GLUCOSE, CAPILLARY: GLUCOSE-CAPILLARY: 158 mg/dL — AB (ref 65–99)

## 2017-09-24 MED ORDER — FLUOCINOLONE ACETONIDE 0.01 % EX SHAM: 1.0000 "application " | MEDICATED_SHAMPOO | Freq: Every day | CUTANEOUS | 0 refills | Status: AC

## 2017-09-24 NOTE — Assessment & Plan Note (Signed)
Hemoglobin A1c of 6.9%.  At goal.  Plan to continue with pioglitazone  30 mg once daily.  Foot exam normal today.  Continue with aspirin and simvastatin for primary prevention of ischemic vascular disease.

## 2017-09-24 NOTE — Assessment & Plan Note (Signed)
This is a new problem, several pruritic and tender lesions on her scalp.  They look very inflamed, nodules with erosions and overlying scab.  She thinks these occurred several weeks after she had a cutaneous dermatitis from poison ivy.  The dermatitis on her legs and arms is now resolved but the scalp lesions persist.  She is requesting systemic steroids, we talked about the risks to her blood sugars.  Plan is to try topical steroids for now, prescribed fluocinolone shampoo to be used once daily for 2 weeks.  If no better call me, and we can discuss risks and benefits of systemic steroids.

## 2017-09-24 NOTE — Assessment & Plan Note (Signed)
Blood pressure is well controlled today.  Plan to continue hydrochlorothiazide 25 mg once daily.

## 2017-09-24 NOTE — Progress Notes (Signed)
   Assessment and Plan:  See Encounters tab for problem-based medical decision making.   __________________________________________________________  HPI:   71 year old woman here for follow-up of diabetes.  She reports doing very well on her current medications.  No side effects.  No hypoglycemia.  About 2 months ago she had an interruption of poison ivy on her legs and arms.  She went to an urgent care and received systemic steroids which helped.  She reports that the lesions on her legs went away a couple weeks ago.  However she feels like the lesions have spread down to her scalp.  She has multiple tender nodules on her scalp which are very itchy and painful to touch.  She says it keeps her from combing her hair.  No fevers or chills.  No chest pain, dyspnea on exertion, orthopnea or PND.  Reports good compliance with her other medications.  Denies any side effects to steroids in the past.  __________________________________________________________  Problem List: Patient Active Problem List   Diagnosis Date Noted  . Diabetes type 2, controlled ([place]) 09/06/2007    Priority: High  . Essential hypertension 09/06/2007    Priority: High  . Chronic kidney disease 08/15/2016    Priority: Medium  . Routine health maintenance 03/20/2012    Priority: Low  . Dyslipidemia 09/06/2007    Priority: Low  . Skin lesion of scalp 09/24/2017    Medications: Reconciled today in Epic __________________________________________________________  Physical Exam:  Vital Signs: Vitals:   09/24/17 1024  BP: (!) 123/56  Pulse: 70  Temp: 98.2 F (36.8 C)  TempSrc: Oral  SpO2: 100%  Weight: 196 lb 8 oz (89.1 kg)  Height: 5\' 5"  (1.651 m)    Gen: Well appearing, NAD CV: RRR, no murmurs Pulm: Normal effort, CTA throughout, no wheezing Ext: Warm, no edema, normal joints Skin: Her scalp has thin hair, there are several 1 cm nodules on the top of the scalp with an overlying scab and minor erosions.   These are tender to touch, very small amount of purulence associated with them.

## 2017-10-25 ENCOUNTER — Other Ambulatory Visit: Payer: Self-pay | Admitting: Student in an Organized Health Care Education/Training Program

## 2017-10-25 NOTE — Telephone Encounter (Signed)
Next appt scheduled  01/21/18 with PCP.

## 2017-11-15 ENCOUNTER — Other Ambulatory Visit: Payer: Self-pay | Admitting: Student in an Organized Health Care Education/Training Program

## 2018-01-21 ENCOUNTER — Ambulatory Visit: Payer: Medicare PPO | Admitting: Student in an Organized Health Care Education/Training Program

## 2018-01-28 ENCOUNTER — Encounter: Payer: Self-pay | Admitting: Student in an Organized Health Care Education/Training Program

## 2018-01-28 ENCOUNTER — Ambulatory Visit (INDEPENDENT_AMBULATORY_CARE_PROVIDER_SITE_OTHER): Payer: Medicare PPO | Admitting: Student in an Organized Health Care Education/Training Program

## 2018-01-28 VITALS — BP 136/63 | HR 76 | Temp 98.4°F | Wt 193.8 lb

## 2018-01-28 DIAGNOSIS — E2839 Other primary ovarian failure: Secondary | ICD-10-CM | POA: Diagnosis not present

## 2018-01-28 DIAGNOSIS — Z79899 Other long term (current) drug therapy: Secondary | ICD-10-CM

## 2018-01-28 DIAGNOSIS — E669 Obesity, unspecified: Secondary | ICD-10-CM

## 2018-01-28 DIAGNOSIS — I10 Essential (primary) hypertension: Secondary | ICD-10-CM

## 2018-01-28 DIAGNOSIS — Z7984 Long term (current) use of oral hypoglycemic drugs: Secondary | ICD-10-CM | POA: Diagnosis not present

## 2018-01-28 DIAGNOSIS — E119 Type 2 diabetes mellitus without complications: Secondary | ICD-10-CM | POA: Diagnosis not present

## 2018-01-28 DIAGNOSIS — Z6832 Body mass index (BMI) 32.0-32.9, adult: Secondary | ICD-10-CM

## 2018-01-28 DIAGNOSIS — E785 Hyperlipidemia, unspecified: Secondary | ICD-10-CM | POA: Diagnosis not present

## 2018-01-28 DIAGNOSIS — Z7982 Long term (current) use of aspirin: Secondary | ICD-10-CM

## 2018-01-28 DIAGNOSIS — M8580 Other specified disorders of bone density and structure, unspecified site: Secondary | ICD-10-CM | POA: Insufficient documentation

## 2018-01-28 LAB — GLUCOSE, CAPILLARY: GLUCOSE-CAPILLARY: 133 mg/dL — AB (ref 65–99)

## 2018-01-28 LAB — POCT GLYCOSYLATED HEMOGLOBIN (HGB A1C): HEMOGLOBIN A1C: 6.5

## 2018-01-28 NOTE — Patient Instructions (Signed)
1. Continue the same medicines  2. I ordered a bone scan to screen for osteoporosis (weak bones)  3. We set a goal for increasing your exercise.

## 2018-01-28 NOTE — Progress Notes (Signed)
   Assessment and Plan:  See Encounters tab for problem-based medical decision making.   __________________________________________________________  HPI:   72 year old woman here for follow-up of diabetes.  She reports doing well at home, compliant with her medications without adverse side effects.  She uses Actos for the diabetes and hydrochlorothiazide for pressure.  She reports having one fall since I last saw her, slipped in her friend's yard.  She had a sprain of her left ankle and a contusion on her right knee.  She was seen at urgent care and recovered well with supportive care alone.  Denies any other recent illnesses.  She is not exercising as much as she should.  She says that when she gets up and does yardwork her heart races pretty fast.  Denies any other recent fevers or chills, no chest pain or pressure, dyspnea on exertion, orthopnea, or PND.  __________________________________________________________  Problem List: Patient Active Problem List   Diagnosis Date Noted  . Diabetes type 2, controlled ([place]) 09/06/2007    Priority: High  . Essential hypertension 09/06/2007    Priority: High  . Routine health maintenance 03/20/2012    Priority: Low  . Dyslipidemia 09/06/2007    Priority: Low  . Estrogen deficiency 01/28/2018    Medications: Reconciled today in Epic __________________________________________________________  Physical Exam:  Vital Signs: Vitals:   01/28/18 0839  BP: 136/63  Pulse: 76  Temp: 98.4 F (36.9 C)  TempSrc: Oral  SpO2: 100%  Weight: 193 lb 12.8 oz (87.9 kg)    Gen: Well appearing, NAD CV: RRR, no murmurs Pulm: Normal effort, CTA throughout, no wheezingN Ext: Warm, no edema, normal joints Skin: No atypical appearing moles. No rashes

## 2018-01-28 NOTE — Assessment & Plan Note (Signed)
Clinically stable, tolerating statin well.  Plan to continue with simvastatin 40 mg once daily.  This is primary prevention of ischemic vascular disease.  She has historically used aspirin 81 mg as well,  low bleeding risk, I think it is fine to continue for now.

## 2018-01-28 NOTE — Assessment & Plan Note (Signed)
Blood pressure is well controlled today.  Plan to continue with hydrochlorothiazide 25 mg once daily.  This is a historic regimen that has worked well for her, if you see any signs of proteinuria in the future I will transition this to lisinopril.

## 2018-01-28 NOTE — Assessment & Plan Note (Signed)
Hemoglobin A1c 6.5%, excellent control.  Plan is to continue with Actos 30 mg once daily, unfortunately intolerant to metformin.  We talked about increasing exercise weight control today.  She follows up with an ophthalmologist, will continue with simvastatin and aspirin for primary prevention of ischemic vascular disease.

## 2018-01-28 NOTE — Assessment & Plan Note (Signed)
Moderate obesity with a BMI of 32.  Weight is stable.  We talked about increasing exercise and activity.  May consider referral to nutritionist if weight remains elevated.

## 2018-01-28 NOTE — Assessment & Plan Note (Signed)
Patient is overdue for screening for osteoporosis.  I have ordered a DEXA scan.

## 2018-02-28 ENCOUNTER — Encounter: Payer: Self-pay | Admitting: Student in an Organized Health Care Education/Training Program

## 2018-02-28 ENCOUNTER — Ambulatory Visit
Admission: RE | Admit: 2018-02-28 | Discharge: 2018-02-28 | Disposition: A | Discharge status: Home / Self Care | Payer: Medicare PPO | Source: Ambulatory Visit | Attending: Student in an Organized Health Care Education/Training Program | Admitting: Student in an Organized Health Care Education/Training Program

## 2018-02-28 DIAGNOSIS — E2839 Other primary ovarian failure: Secondary | ICD-10-CM

## 2018-03-08 ENCOUNTER — Ambulatory Visit (INDEPENDENT_AMBULATORY_CARE_PROVIDER_SITE_OTHER): Payer: Medicare PPO | Admitting: *Deleted

## 2018-03-08 DIAGNOSIS — Z0000 Encounter for general adult medical examination without abnormal findings: Secondary | ICD-10-CM | POA: Diagnosis not present

## 2018-03-11 LAB — TB SKIN TEST: TB SKIN TEST: NEGATIVE

## 2018-03-26 ENCOUNTER — Encounter: Payer: Self-pay | Admitting: Student in an Organized Health Care Education/Training Program

## 2018-04-09 ENCOUNTER — Other Ambulatory Visit: Payer: Self-pay | Admitting: Student in an Organized Health Care Education/Training Program

## 2018-04-16 ENCOUNTER — Other Ambulatory Visit: Payer: Self-pay | Admitting: Internal Medicine

## 2018-04-16 MED ORDER — CHLORTHALIDONE 25 MG PO TABS: 25.0000 mg | ORAL_TABLET | Freq: Every day | ORAL | 3 refills | Status: DC

## 2018-04-16 NOTE — Telephone Encounter (Signed)
Pt contacted and informed of medication change. Pt verbalized understanding-to finish hctz and pick up new rx for chlorthalidone.[name]6/4/201910:51 AM

## 2018-04-16 NOTE — Telephone Encounter (Signed)
Last BP 136, so I am going to try changing from HCTZ to Chlorthalidone now to get better control.

## 2018-05-14 ENCOUNTER — Other Ambulatory Visit: Payer: Self-pay | Admitting: Student in an Organized Health Care Education/Training Program

## 2018-05-14 ENCOUNTER — Telehealth: Payer: Self-pay | Admitting: *Deleted

## 2018-05-14 NOTE — Telephone Encounter (Signed)
Jonelle Sidle, with [employer] called to report patient currently not taking statin. Informed that simvastain 40 mg daily #90 with 1 refill sent to [facility] on 11/15/2017. Jonelle Sidle states per database patient hasn't filled statin in last 6 months. Patient states she's been taking it everyday. Spoke with Pharmacist and patient last filled simvastatin Sept 2018. Called patient back. Continues to say she takes simvastatin. May miss only a day or two. Will route to PCP. Hubbard Hartshorn, RN, BSN

## 2018-05-14 NOTE — Telephone Encounter (Signed)
Telephone note reviewed.  Refill sounds appropriate will provide.

## 2018-07-03 ENCOUNTER — Other Ambulatory Visit: Payer: Self-pay | Admitting: Student in an Organized Health Care Education/Training Program

## 2018-08-05 ENCOUNTER — Ambulatory Visit (INDEPENDENT_AMBULATORY_CARE_PROVIDER_SITE_OTHER): Payer: Medicare PPO | Admitting: *Deleted

## 2018-08-05 DIAGNOSIS — Z23 Encounter for immunization: Secondary | ICD-10-CM | POA: Diagnosis not present

## 2018-08-26 ENCOUNTER — Ambulatory Visit (INDEPENDENT_AMBULATORY_CARE_PROVIDER_SITE_OTHER): Payer: Medicare PPO | Admitting: Student in an Organized Health Care Education/Training Program

## 2018-08-26 ENCOUNTER — Encounter: Payer: Self-pay | Admitting: Student in an Organized Health Care Education/Training Program

## 2018-08-26 ENCOUNTER — Other Ambulatory Visit: Payer: Self-pay

## 2018-08-26 VITALS — BP 126/64 | HR 60 | Temp 98.1°F | Ht 65.0 in | Wt 180.8 lb

## 2018-08-26 DIAGNOSIS — E119 Type 2 diabetes mellitus without complications: Secondary | ICD-10-CM

## 2018-08-26 DIAGNOSIS — M85852 Other specified disorders of bone density and structure, left thigh: Secondary | ICD-10-CM | POA: Diagnosis not present

## 2018-08-26 DIAGNOSIS — E1169 Type 2 diabetes mellitus with other specified complication: Secondary | ICD-10-CM | POA: Diagnosis not present

## 2018-08-26 DIAGNOSIS — I10 Essential (primary) hypertension: Secondary | ICD-10-CM

## 2018-08-26 LAB — POCT GLYCOSYLATED HEMOGLOBIN (HGB A1C): Hemoglobin A1C: 7.1 % — AB (ref 4.0–5.6)

## 2018-08-26 LAB — GLUCOSE, CAPILLARY: GLUCOSE-CAPILLARY: 99 mg/dL (ref 70–99)

## 2018-08-26 MED ORDER — PIOGLITAZONE HCL 30 MG PO TABS: 30.0000 mg | ORAL_TABLET | Freq: Every day | ORAL | 3 refills | Status: DC

## 2018-08-26 NOTE — Progress Notes (Signed)
   Assessment and Plan:  See Encounters tab for problem-based medical decision making.   __________________________________________________________  HPI:   72 year old woman here for follow-up of diabetes and hypertension.  It is been about 6 months since I last saw her.  Doing well with no acute illnesses.  Reports good compliance with her medications, no adverse side effects.  She works 4 days a week at [employer].  Limited exercise on other days.  No recent chest pain or shortness of breath.  Mood is stable.  Denies any history of hypoglycemia, does not check her blood sugars which is okay.  She reports feeling in good health.  She keeps on top of all her appointments.  Her insurance company also seems to follow-up with her very closely by phone to ensure compliance with medications and is even doing some testing which I do not have records of.  She says that they asked her to check her urine for protein and everything was fine?  __________________________________________________________  Problem List: Patient Active Problem List   Diagnosis Date Noted  . Type 2 diabetes mellitus with other specified complication ([place]) 88/91/6945    Priority: High  . Essential hypertension 09/06/2007    Priority: High  . Osteopenia 01/28/2018    Priority: Low  . Obesity (BMI 30.0-34.9) 01/28/2018    Priority: Low  . Routine health maintenance 03/20/2012    Priority: Low  . Dyslipidemia 09/06/2007    Priority: Low    Medications: Reconciled today in Epic __________________________________________________________  Physical Exam:  Vital Signs: Vitals:   08/26/18 0948  BP: 126/64  Pulse: 60  Temp: 98.1 F (36.7 C)  TempSrc: Oral  SpO2: 97%  Weight: 180 lb 12.8 oz (82 kg)  Height: 5\' 5"  (1.651 m)    Gen: Well appearing, NAD ENT: OP clear without erythema or exudate.  Neck: No cervical LAD, No thyromegaly or nodules, No JVD. CV: RRR, no murmurs Pulm: Normal effort, CTA  throughout, no wheezing Abd: Soft, NT, ND. Ext: Warm, no edema, normal joints

## 2018-08-26 NOTE — Assessment & Plan Note (Signed)
Blood pressure very well controlled, at goal.  Plan to continue with chlorthalidone 25 mg daily.  If she starts to show signs of proteinuria will change over to ACE inhibitor.

## 2018-08-26 NOTE — Assessment & Plan Note (Signed)
Hemoglobin A1c is at goal at 7.1%.  Doing well on p.o. glitazone, plan to continue with 30 mg daily.  She has metformin intolerance.  No comorbid heart disease.  Continue with aspirin and simvastatin for present prevention of ischemic heart disease.  Will check urine microalbumin today.  She follows up closely with ophthalmology.

## 2018-08-26 NOTE — Patient Instructions (Addendum)
Continue your medications as you are taking them.  You are doing a great job.  Your blood pressure and your blood sugars are under good control.  We are to check your blood work today, I will send you a result in a letter later this week.

## 2018-08-26 NOTE — Assessment & Plan Note (Signed)
Bone density screen showed osteopenia with a T score of -1.1.  Patient is a healthy weight, healthy diet, I encouraged more exercise.  We will consider repeating a DEXA scan around 2021.

## 2018-08-27 LAB — BMP8+ANION GAP
Anion Gap: 16 mmol/L (ref 10.0–18.0)
BUN / CREAT RATIO: 17 (ref 12–28)
BUN: 20 mg/dL (ref 8–27)
CO2: 24 mmol/L (ref 20–29)
Calcium: 9.7 mg/dL (ref 8.7–10.3)
Chloride: 101 mmol/L (ref 96–106)
Creatinine, Ser: 1.18 mg/dL — ABNORMAL HIGH (ref 0.57–1.00)
GFR calc Af Amer: 53 mL/min/{1.73_m2} — ABNORMAL LOW (ref >59)
GFR calc non Af Amer: 46 mL/min/{1.73_m2} — ABNORMAL LOW (ref >59)
GLUCOSE: 103 mg/dL — AB (ref 65–99)
Potassium: 3.6 mmol/L (ref 3.5–5.2)
Sodium: 141 mmol/L (ref 134–144)

## 2018-08-27 LAB — MICROALBUMIN / CREATININE URINE RATIO: Creatinine, Urine: 109 mg/dL

## 2018-08-28 ENCOUNTER — Encounter: Payer: Self-pay | Admitting: Student in an Organized Health Care Education/Training Program

## 2018-12-05 ENCOUNTER — Encounter: Payer: Self-pay | Admitting: *Deleted

## 2018-12-25 DIAGNOSIS — R109 Unspecified abdominal pain: Secondary | ICD-10-CM | POA: Diagnosis not present

## 2018-12-25 DIAGNOSIS — R110 Nausea: Secondary | ICD-10-CM | POA: Diagnosis not present

## 2018-12-29 ENCOUNTER — Encounter (HOSPITAL_COMMUNITY): Payer: Self-pay | Admitting: *Deleted

## 2018-12-29 ENCOUNTER — Ambulatory Visit (HOSPITAL_COMMUNITY)
Admission: EM | Admit: 2018-12-29 | Discharge: 2018-12-29 | Disposition: A | Discharge status: Home / Self Care | Payer: Medicare Other | Attending: Family Medicine | Admitting: Family Medicine

## 2018-12-29 ENCOUNTER — Other Ambulatory Visit: Payer: Self-pay

## 2018-12-29 DIAGNOSIS — R69 Illness, unspecified: Principal | ICD-10-CM

## 2018-12-29 DIAGNOSIS — J111 Influenza due to unidentified influenza virus with other respiratory manifestations: Secondary | ICD-10-CM

## 2018-12-29 DIAGNOSIS — R05 Cough: Secondary | ICD-10-CM | POA: Diagnosis not present

## 2018-12-29 DIAGNOSIS — R5383 Other fatigue: Secondary | ICD-10-CM

## 2018-12-29 DIAGNOSIS — M7918 Myalgia, other site: Secondary | ICD-10-CM | POA: Diagnosis not present

## 2018-12-29 DIAGNOSIS — R509 Fever, unspecified: Secondary | ICD-10-CM | POA: Diagnosis not present

## 2018-12-29 MED ORDER — OSELTAMIVIR PHOSPHATE 75 MG PO CAPS: 75.0000 mg | ORAL_CAPSULE | Freq: Two times a day (BID) | ORAL | 0 refills | Status: DC

## 2018-12-29 MED ORDER — BENZONATATE 100 MG PO CAPS: 100.0000 mg | ORAL_CAPSULE | Freq: Three times a day (TID) | ORAL | 0 refills | Status: DC

## 2018-12-29 NOTE — ED Provider Notes (Signed)
[facility]   [id number] 12/29/18 Arrival Time: 6812   CC: flu symptoms   SUBJECTIVE: History from: patient.  [name] is a 73 y.o. female hx significant for DM, GERD, HLD, and HTN, who presents with abrupt onset of cough, body aches, fever, chills, and fatigue x 1 day.  Denies known sick exposure or precipitating event.  Has tried OTC medications without relief.  Denies aggravating factors.  Reports previous symptoms in the past.   Denies sinus pain, rhinorrhea, sore throat, SOB, wheezing, chest pain, nausea, changes in bowel or bladder habits.    Received flu shot this year: yes.  ROS: As per HPI.  Past Medical History:  Diagnosis Date  . Diabetes mellitus   . GERD (gastroesophageal reflux disease)   . HLD (hyperlipidemia)   . Hypertension    History reviewed. No pertinent surgical history. Allergies  Allergen Reactions  . Metformin     REACTION: Nausea/Diarrhea, feeling very sick, missing work   No current facility-administered medications on file prior to encounter.    Current Outpatient Medications on File Prior to Encounter  Medication Sig Dispense Refill  . chlorthalidone (HYGROTON) 25 MG tablet Take 1 tablet (25 mg total) by mouth daily. 90 tablet 3  . pioglitazone (ACTOS) 30 MG tablet Take 1 tablet (30 mg total) by mouth daily. 90 tablet 3  . simvastatin (ZOCOR) 40 MG tablet TAKE 1 TABLET BY MOUTH AT BEDTIME 90 tablet 3   Social History   Socioeconomic History  . Marital status: Single    Spouse name: Not on file  . Number of children: Not on file  . Years of education: 30  . Highest education level: Not on file  Occupational History    Employer: [employer]  . Financial resource strain: Not on file  . Food insecurity:    Worry: Not on file    Inability: Not on file  . Transportation needs:    Medical: Not on file    Non-medical: Not on file  Tobacco Use  . Smoking status: Never Smoker  . Smokeless tobacco: Never Used   Substance and Sexual Activity  . Alcohol use: No  . Drug use: No  . Sexual activity: Not on file  Lifestyle  . Physical activity:    Days per week: Not on file    Minutes per session: Not on file  . Stress: Not on file  Relationships  . Social connections:    Talks on phone: Not on file    Gets together: Not on file    Attends religious service: Not on file    Active member of club or organization: Not on file    Attends meetings of clubs or organizations: Not on file    Relationship status: Not on file  . Intimate partner violence:    Fear of current or ex partner: Not on file    Emotionally abused: Not on file    Physically abused: Not on file    Forced sexual activity: Not on file  Other Topics Concern  . Not on file  Social History Narrative  . Not on file   Family History  Problem Relation Age of Onset  . Stroke Neg Hx   . Heart disease Neg Hx   . Cancer Neg Hx     OBJECTIVE:  Vitals:   12/29/18 1717 12/29/18 1718  BP:  127/63  Pulse: 81   Resp: 20   Temp: 100.2 F (37.9 C)  TempSrc: Temporal   SpO2: 100%      General appearance: alert; appears fatigued, but nontoxic; speaking in full sentences and tolerating own secretions HEENT: NCAT; Ears: EACs clear, TMs pearly gray; Eyes: PERRL.  EOM grossly intact. Nose: nares patent without rhinorrhea, Throat: oropharynx clear, tonsils non erythematous or enlarged, uvula midline  Neck: supple without LAD Lungs: CTAB with mild cough Heart: Soft systolic murmur; Radial pulses 2+ symmetrical bilaterally Skin: warm and dry Psychological: alert and cooperative; normal mood and affect  ASSESSMENT & PLAN:  1. Influenza-like illness     Meds ordered this encounter  Medications  . oseltamivir (TAMIFLU) 75 MG capsule    Sig: Take 1 capsule (75 mg total) by mouth every 12 (twelve) hours.    Dispense:  10 capsule    Refill:  0    Order Specific Question:   Supervising Provider    Answer:   Raylene Everts  [[id number]]  . benzonatate (TESSALON) 100 MG capsule    Sig: Take 1 capsule (100 mg total) by mouth every 8 (eight) hours.    Dispense:  21 capsule    Refill:  0    Order Specific Question:   Supervising Provider    Answer:   Raylene Everts [[id number]]    Get plenty of rest and push fluids.  Drink at least half your body weight in ounces.  You may supplement with OTC Pedialyte or oral rehydration solution Tamiflu prescribed.  Take as directed and to completion Tessalon Perles prescribed for cough Use OTC tylenol every 4 hours for fever Follow up with PCP for recheck this week to ensure symptoms are improving Go to the ED if you have any new or worsening symptoms fever that does not moderate with tylenol, chills, nausea, vomiting, chest pain, worsening cough, shortness of breath, wheezing, abdominal pain, changes in bowel or bladder habits, etc...    Reviewed expectations re: course of current medical issues. Questions answered. Outlined signs and symptoms indicating need for more acute intervention. Patient verbalized understanding. After Visit Summary given.         Lestine Box, PA-C 12/29/18 1759

## 2018-12-29 NOTE — Discharge Instructions (Addendum)
Get plenty of rest and push fluids.  Drink at least half your body weight in ounces.  You may supplement with OTC Pedialyte or oral rehydration solution Tamiflu prescribed.  Take as directed and to completion Tessalon Perles prescribed for cough Use OTC tylenol every 4 hours for fever Follow up with PCP for recheck this week to ensure symptoms are improving Go to the ED if you have any new or worsening symptoms fever that does not moderate with tylenol, chills, nausea, vomiting, chest pain, worsening cough, shortness of breath, wheezing, abdominal pain, changes in bowel or bladder habits, etc..Marland Kitchen

## 2018-12-29 NOTE — ED Triage Notes (Signed)
C/O congestion and cough x 2 days without fever.

## 2019-01-06 DIAGNOSIS — R05 Cough: Secondary | ICD-10-CM | POA: Diagnosis not present

## 2019-01-06 DIAGNOSIS — J019 Acute sinusitis, unspecified: Secondary | ICD-10-CM | POA: Diagnosis not present

## 2019-01-21 ENCOUNTER — Ambulatory Visit (HOSPITAL_COMMUNITY)
Admission: EM | Admit: 2019-01-21 | Discharge: 2019-01-21 | Disposition: A | Discharge status: Home / Self Care | Payer: Medicare Other | Attending: Family Medicine | Admitting: Family Medicine

## 2019-01-21 ENCOUNTER — Encounter (HOSPITAL_COMMUNITY): Payer: Self-pay | Admitting: Emergency Medicine

## 2019-01-21 DIAGNOSIS — M546 Pain in thoracic spine: Secondary | ICD-10-CM

## 2019-01-21 MED ORDER — PREDNISONE 20 MG PO TABS: 20.0000 mg | ORAL_TABLET | Freq: Two times a day (BID) | ORAL | 0 refills | Status: AC

## 2019-01-21 MED ORDER — KETOROLAC TROMETHAMINE 30 MG/ML IJ SOLN
30.0000 mg | Freq: Once | INTRAMUSCULAR | Status: AC
  Administered 2019-01-21: 30 mg via INTRAMUSCULAR

## 2019-01-21 MED ORDER — KETOROLAC TROMETHAMINE 30 MG/ML IJ SOLN
INTRAMUSCULAR | Status: AC
  Filled 2019-01-21: qty 1

## 2019-01-21 MED ORDER — TIZANIDINE HCL 2 MG PO TABS: 2.0000 mg | ORAL_TABLET | Freq: Every day | ORAL | 0 refills | Status: DC

## 2019-01-21 NOTE — Discharge Instructions (Addendum)
Toradol shot given in office.   Symptoms seem to be musculoskeletal at this time Continue conservative management of rest, ice, heat, gentle massage, and gentle stretches Prednisone prescribed.  Take as directed and to completion Take zanafelx at nighttime for symptomatic relief. Avoid driving or operating heavy machinery while using medication. Follow up with PCP if symptoms persist Return or go to the ER if you have any new or worsening symptoms (fever, chills, chest pain, shortness of breath, abdominal pain, changes in bowel or bladder habits, pain radiating into lower legs, etc...)

## 2019-01-21 NOTE — ED Triage Notes (Signed)
Pt states on Saturday she has back pain that's shooting from her back around her sides, states it hurts worse if she lays on her R side, hurts worse with bending over.

## 2019-01-21 NOTE — ED Provider Notes (Signed)
[place]   [id number] 01/21/19 Arrival Time: 6812  CC: Back pain  SUBJECTIVE: History from: patient. [name] is a 73 y.o. female hx significant for DM, GERD, HLD, and HTN, complains of right sided mid back pain that began 4 days ago.  Denies a precipitating event or specific injury, but admits to a lot of repetitive twisting motions at work.   Also getting over URI symptoms with persistent cough.  Localizes the pain to the RT mid back, under shoulder blade.  Describes the pain as intermittent and sharp in character.  Has tried OTC medications without relief.  Symptoms are made worse with bending forward, and taking a deep breath.  Denies similar symptoms in the past.  Denies fever, chills, lightheadedness, dizziness, CP, SOB, abdominal pain, erythema, ecchymosis, effusion, weakness, numbness and tingling.      ROS: As per HPI.  Past Medical History:  Diagnosis Date  . Diabetes mellitus   . GERD (gastroesophageal reflux disease)   . HLD (hyperlipidemia)   . Hypertension    History reviewed. No pertinent surgical history. Allergies  Allergen Reactions  . Metformin     REACTION: Nausea/Diarrhea, feeling very sick, missing work   No current facility-administered medications on file prior to encounter.    Current Outpatient Medications on File Prior to Encounter  Medication Sig Dispense Refill  . chlorthalidone (HYGROTON) 25 MG tablet Take 1 tablet (25 mg total) by mouth daily. 90 tablet 3  . pioglitazone (ACTOS) 30 MG tablet Take 1 tablet (30 mg total) by mouth daily. 90 tablet 3  . simvastatin (ZOCOR) 40 MG tablet TAKE 1 TABLET BY MOUTH AT BEDTIME 90 tablet 3   Social History   Socioeconomic History  . Marital status: Single    Spouse name: Not on file  . Number of children: Not on file  . Years of education: 65  . Highest education level: Not on file  Occupational History    Employer: [employer]  . Financial resource strain: Not on file    . Food insecurity:    Worry: Not on file    Inability: Not on file  . Transportation needs:    Medical: Not on file    Non-medical: Not on file  Tobacco Use  . Smoking status: Never Smoker  . Smokeless tobacco: Never Used  Substance and Sexual Activity  . Alcohol use: No  . Drug use: No  . Sexual activity: Not on file  Lifestyle  . Physical activity:    Days per week: Not on file    Minutes per session: Not on file  . Stress: Not on file  Relationships  . Social connections:    Talks on phone: Not on file    Gets together: Not on file    Attends religious service: Not on file    Active member of club or organization: Not on file    Attends meetings of clubs or organizations: Not on file    Relationship status: Not on file  . Intimate partner violence:    Fear of current or ex partner: Not on file    Emotionally abused: Not on file    Physically abused: Not on file    Forced sexual activity: Not on file  Other Topics Concern  . Not on file  Social History Narrative  . Not on file   Family History  Problem Relation Age of Onset  . Stroke Neg Hx   . Heart disease Neg  Hx   . Cancer Neg Hx     OBJECTIVE:  Vitals:   01/21/19 1227  BP: 119/82  Pulse: 62  Resp: 18  Temp: 97.7 F (36.5 C)  SpO2: 100%    General appearance: Alert; in no acute distress.  Head: NCAT Lungs: CTA bilaterally Heart: RRR.  Radial pulses 2+ bilaterally. Musculoskeletal: Back Inspection: Skin warm, dry, clear and intact without obvious erythema, effusion, or ecchymosis.  Palpation: TTP over RT mid back inferior to scapula; no midline tenderness ROM: FROM active and passive Strength: 5/5 shld abduction, 5/5 shld adduction, 5/5 elbow flexion, 5/5 elbow extension, 5/5 grip strength  Abdomen: soft, nondistended, normal active bowel sounds; nontender to palpation; negative murphy's sign; no guarding   Skin: warm and dry Neurologic: Ambulates without difficulty; Sensation intact about the  upper extremities Psychological: alert and cooperative; normal mood and affect  ASSESSMENT & PLAN:  1. Acute right-sided thoracic back pain     Meds ordered this encounter  Medications  . predniSONE (DELTASONE) 20 MG tablet    Sig: Take 1 tablet (20 mg total) by mouth 2 (two) times daily with a meal for 5 days.    Dispense:  10 tablet    Refill:  0    Order Specific Question:   Supervising Provider    Answer:   Raylene Everts [[id number]]  . tiZANidine (ZANAFLEX) 2 MG tablet    Sig: Take 1 tablet (2 mg total) by mouth at bedtime.    Dispense:  12 tablet    Refill:  0    Order Specific Question:   Supervising Provider    Answer:   Raylene Everts [[id number]]  . ketorolac (TORADOL) 30 MG/ML injection 30 mg   Toradol shot given in office.   Symptoms seem to be musculoskeletal at this time Continue conservative management of rest, ice, heat, gentle massage, and gentle stretches Prednisone prescribed.  Take as directed and to completion Take zanafelx at nighttime for symptomatic relief. Avoid driving or operating heavy machinery while using medication. Follow up with PCP if symptoms persist Return or go to the ER if you have any new or worsening symptoms (fever, chills, chest pain, shortness of breath, abdominal pain, changes in bowel or bladder habits, pain radiating into lower legs, etc...)   Reviewed expectations re: course of current medical issues. Questions answered. Outlined signs and symptoms indicating need for more acute intervention. Patient verbalized understanding. After Visit Summary given.    Lestine Box, PA-C 01/21/19 1404

## 2019-01-26 ENCOUNTER — Encounter (HOSPITAL_COMMUNITY): Payer: Self-pay

## 2019-01-26 ENCOUNTER — Other Ambulatory Visit: Payer: Self-pay

## 2019-01-26 ENCOUNTER — Ambulatory Visit (HOSPITAL_COMMUNITY)
Admission: EM | Admit: 2019-01-26 | Discharge: 2019-01-26 | Disposition: A | Discharge status: Home / Self Care | Payer: Medicare Other | Attending: Family Medicine | Admitting: Family Medicine

## 2019-01-26 DIAGNOSIS — B029 Zoster without complications: Secondary | ICD-10-CM | POA: Diagnosis not present

## 2019-01-26 MED ORDER — HYDROCODONE-ACETAMINOPHEN 5-325 MG PO TABS: 1.0000 | ORAL_TABLET | Freq: Four times a day (QID) | ORAL | 0 refills | Status: DC | PRN

## 2019-01-26 MED ORDER — GABAPENTIN 300 MG PO CAPS: 300.0000 mg | ORAL_CAPSULE | Freq: Two times a day (BID) | ORAL | 0 refills | Status: DC

## 2019-01-26 MED ORDER — KETOROLAC TROMETHAMINE 60 MG/2ML IM SOLN: 60.0000 mg | Freq: Once | INTRAMUSCULAR | Status: DC

## 2019-01-26 MED ORDER — KETOROLAC TROMETHAMINE 30 MG/ML IJ SOLN
INTRAMUSCULAR | Status: AC
  Filled 2019-01-26: qty 1

## 2019-01-26 MED ORDER — KETOROLAC TROMETHAMINE 60 MG/2ML IM SOLN
30.0000 mg | Freq: Once | INTRAMUSCULAR | Status: AC
  Administered 2019-01-26: 30 mg via INTRAMUSCULAR

## 2019-01-26 MED ORDER — VALACYCLOVIR HCL 1 G PO TABS: 1000.0000 mg | ORAL_TABLET | Freq: Three times a day (TID) | ORAL | 0 refills | Status: DC

## 2019-01-26 NOTE — ED Provider Notes (Signed)
[place]    CSN: [id number] Arrival date & time: 01/26/19  1003     History   Chief Complaint Chief Complaint  Patient presents with  . shingles?    HPI [name] is a 73 y.o. female.   HPI  Patient was seen here 10 days ago for severe pain around her right flank.  Reason for pain was not identified.  She was given 5 days of prednisone, muscle relaxer, anti-inflammatories for pain management.  The pain is become more severe over time, now she has a blistering rash that has broken out in the same distribution.  She has never had shingles shots.  Past Medical History:  Diagnosis Date  . Diabetes mellitus   . GERD (gastroesophageal reflux disease)   . HLD (hyperlipidemia)   . Hypertension     Patient Active Problem List   Diagnosis Date Noted  . Osteopenia 01/28/2018  . Obesity (BMI 30.0-34.9) 01/28/2018  . Routine health maintenance 03/20/2012  . Type 2 diabetes mellitus with other specified complication ([place]) 17/49/4496  . Dyslipidemia 09/06/2007  . Essential hypertension 09/06/2007    History reviewed. No pertinent surgical history.  OB History   No obstetric history on file.      Home Medications    Prior to Admission medications   Medication Sig Start Date End Date Taking? Authorizing Provider  chlorthalidone (HYGROTON) 25 MG tablet Take 1 tablet (25 mg total) by mouth daily. 04/16/18   Axel Filler, MD  gabapentin (NEURONTIN) 300 MG capsule Take 1 capsule (300 mg total) by mouth 2 (two) times daily. 01/26/19   Raylene Everts, MD  HYDROcodone-acetaminophen (NORCO/VICODIN) 5-325 MG tablet Take 1-2 tablets by mouth every 6 (six) hours as needed. 01/26/19   Raylene Everts, MD  pioglitazone (ACTOS) 30 MG tablet Take 1 tablet (30 mg total) by mouth daily. 08/26/18   Axel Filler, MD  predniSONE (DELTASONE) 20 MG tablet Take 1 tablet (20 mg total) by mouth 2 (two) times daily with a meal for 5 days. 01/21/19 01/26/19  Wurst,  [place], PA-C  simvastatin (ZOCOR) 40 MG tablet TAKE 1 TABLET BY MOUTH AT BEDTIME 05/14/18   Lucious Groves, DO  tiZANidine (ZANAFLEX) 2 MG tablet Take 1 tablet (2 mg total) by mouth at bedtime. 01/21/19   Wurst, [place], PA-C  valACYclovir (VALTREX) 1000 MG tablet Take 1 tablet (1,000 mg total) by mouth 3 (three) times daily. 01/26/19   Raylene Everts, MD    Family History Family History  Problem Relation Age of Onset  . Stroke Neg Hx   . Heart disease Neg Hx   . Cancer Neg Hx     Social History Social History   Tobacco Use  . Smoking status: Never Smoker  . Smokeless tobacco: Never Used  Substance Use Topics  . Alcohol use: No  . Drug use: No     Allergies   Metformin   Review of Systems Review of Systems  Constitutional: Negative for chills and fever.  HENT: Negative for ear pain and sore throat.   Eyes: Negative for pain and visual disturbance.  Respiratory: Negative for cough and shortness of breath.   Cardiovascular: Negative for chest pain and palpitations.  Gastrointestinal: Negative for abdominal pain and vomiting.  Genitourinary: Negative for dysuria and hematuria.  Musculoskeletal: Negative for arthralgias and back pain.  Skin: Positive for rash. Negative for color change.  Neurological: Negative for seizures and syncope.  All other systems reviewed and are  negative.    Physical Exam Triage Vital Signs ED Triage Vitals [01/26/19 1021]     Weight 170 lb (77.1 kg)     Pain Score 10     Pain Loc      Pain Edu?      Excl. in [place]?    No data found.  Updated Vital Signs BP 128/74 (BP Location: Left Arm)   Pulse 95   Temp 98 F (36.7 C) (Oral)   Resp 18   Wt 77.1 kg   LMP 04/10/1996   SpO2 99%   BMI 28.29 kg/m      Physical Exam Constitutional:      General: She is in acute distress.     Appearance: She is well-developed and normal weight.  HENT:     Head: Normocephalic and atraumatic.  Eyes:     Conjunctiva/sclera: Conjunctivae  normal.     Pupils: Pupils are equal, round, and reactive to light.  Neck:     Musculoskeletal: Normal range of motion.  Cardiovascular:     Rate and Rhythm: Normal rate.  Pulmonary:     Effort: Pulmonary effort is normal. No respiratory distress.    Chest:    Abdominal:     General: There is no distension.     Palpations: Abdomen is soft.  Musculoskeletal: Normal range of motion.  Skin:    General: Skin is warm and dry.  Neurological:     Mental Status: She is alert.      UC Treatments / Results  Labs (all labs ordered are listed, but only abnormal results are displayed) Labs Reviewed - No data to display  EKG None  Radiology No results found.  Procedures Procedures (including critical care time)  Medications Ordered in UC Medications  ketorolac (TORADOL) injection 30 mg (30 mg Intramuscular Given 01/26/19 1054)    Initial Impression / Assessment and Plan / UC Course  I have reviewed the triage vital signs and the nursing notes.  Pertinent labs & imaging results that were available during my care of the patient were reviewed by me and considered in my medical decision making (see chart for details).     Patient has acute shingles.  All the vesicles are intact.  She has had pain for 5 days, the vesicles just showed up yesterday.  I am going to treat her with valacyclovir.  I am giving her gabapentin for the nerve pain.  Limited number of hydrocodone.  Instructions to follow-up with her PCP next week. Final Clinical Impressions(s) / UC Diagnoses   Final diagnoses:  Herpes zoster without complication     Discharge Instructions     Take the valacyclovir 3 x a day for one week This is an anti viral medicine to treat the infection  Take the gabapentin 2 x a day.  May take up to 3 x a day if needed.  This is for nerve pain.  It may cause drowsiness.  Take the hydrocodone as needed for pain.  Take with food.  This can cause drowsiness.  Be careful not to fall  when on string medicine.  DO not drive on the pain medicine  Follow up with your PCP    ED Prescriptions    Medication Sig Dispense Auth. Provider   valACYclovir (VALTREX) 1000 MG tablet Take 1 tablet (1,000 mg total) by mouth 3 (three) times daily. 21 tablet Raylene Everts, MD   gabapentin (NEURONTIN) 300 MG capsule Take 1 capsule (300 mg total) by mouth  2 (two) times daily. 20 capsule Raylene Everts, MD   HYDROcodone-acetaminophen (NORCO/VICODIN) 5-325 MG tablet Take 1-2 tablets by mouth every 6 (six) hours as needed. 12 tablet Raylene Everts, MD     Controlled Substance Prescriptions [place] Controlled Substance Registry consulted? Not Applicable   Raylene Everts, MD 01/26/19 [phone]

## 2019-01-26 NOTE — ED Triage Notes (Signed)
Pt states she has shingles on her back and right side. X 3 days.

## 2019-01-26 NOTE — Discharge Instructions (Signed)
Take the valacyclovir 3 x a day for one week This is an anti viral medicine to treat the infection  Take the gabapentin 2 x a day.  May take up to 3 x a day if needed.  This is for nerve pain.  It may cause drowsiness.  Take the hydrocodone as needed for pain.  Take with food.  This can cause drowsiness.  Be careful not to fall when on string medicine.  DO not drive on the pain medicine  Follow up with your PCP

## 2019-01-27 ENCOUNTER — Telehealth: Payer: Self-pay | Admitting: Student in an Organized Health Care Education/Training Program

## 2019-01-27 ENCOUNTER — Ambulatory Visit: Payer: Medicare Other | Admitting: Student in an Organized Health Care Education/Training Program

## 2019-01-27 MED ORDER — HYDROCODONE-ACETAMINOPHEN 5-325 MG PO TABS: 1.0000 | ORAL_TABLET | Freq: Four times a day (QID) | ORAL | 0 refills | Status: DC | PRN

## 2019-01-27 MED ORDER — LIDOCAINE 5 % EX PTCH: 1.0000 | MEDICATED_PATCH | CUTANEOUS | 0 refills | Status: DC

## 2019-01-27 NOTE — Telephone Encounter (Signed)
Spoke with [name] this morning, I had to cancel our annual wellness visit due to restrictions on routine visits for the COVID-19 pandemic.  She is struggling with an outbreak of cutaneous zoster.  Seen in urgent care yesterday and prescribed gabapentin, Valtrex, and Norco.  She reports still having persistent severe pain that sounds neuropathic at that side.  She is used 6 Norco tablets in 1 day.  Sounds like this is pretty function limiting and she seems to be fairly miserable.  I advised continuing to use Valtrex and gabapentin.  Medicine another short refill of the Norco #12 tablets.  Also will prescribe a lidocaine patch for the affected area.  She tells me that it is all crusted over now.  Also advised using ibuprofen 600 mg twice daily for 2-3 days to help with the inflammation.  Advise she follows up with me in 2 to 3 months when hopefully the restrictions are lifted.  We can reschedule her annual wellness exam then.  There are few outstanding health maintenance items that we need to address.

## 2019-01-31 ENCOUNTER — Telehealth: Payer: Self-pay | Admitting: *Deleted

## 2019-01-31 ENCOUNTER — Telehealth: Payer: Self-pay

## 2019-01-31 NOTE — Telephone Encounter (Signed)
Information was faxed to [facility] for PA for Lidocaine Patches was approved approved 01/31/2019 thru 01/31/2019.  Sander Nephew, RN 01/31/2019 3:53 PM.

## 2019-01-31 NOTE — Telephone Encounter (Signed)
HYDROcodone-acetaminophen (NORCO/VICODIN) 5-325 MG tablet  Refill request @  [facility], [place] [place] AT [place] [phone] (Phone) [phone] (Fax)   Pt would like this meds by today. Please call pt back.

## 2019-02-03 NOTE — Telephone Encounter (Signed)
Pt still having significant pain-gets same relief with ibuprofen as she gets with norco.  Currently taking 400mg  about 3-4 times a day.  Wants to know if lidocaine patches come in a higher strength than the 5% she is currently using.  She has also taken the gabapentin-does she need a refill? Would like to know when she can expect some relief? Please advise.[name]3/23/20203:44 PM

## 2019-02-04 MED ORDER — OXYCODONE-ACETAMINOPHEN 5-325 MG PO TABS: 1.0000 | ORAL_TABLET | Freq: Three times a day (TID) | ORAL | 0 refills | Status: DC | PRN

## 2019-02-04 NOTE — Telephone Encounter (Signed)
Spoke with [name] over the phone. She reports the zoster rash is improving, now all crusted over. No new vesicles. Pain is persistent and severe, limiting functioning. I advised she stop using NSAIDs now, she has used them for over 1 week and I want to avoid side effects. Stop gabapentin, probably not much benefit here. I am going to prescribe Percocet 5/325 to be used up to three times daily as needed for 5 days. If the pain is still severe next week, we should bring her in for an [facility] visit to ensure there is not an alternative cause of this pain.

## 2019-02-06 ENCOUNTER — Other Ambulatory Visit: Payer: Self-pay | Admitting: Student in an Organized Health Care Education/Training Program

## 2019-02-06 NOTE — Telephone Encounter (Signed)
Refill Request   oxyCODONE-acetaminophen (PERCOCET/ROXICET) 5-325 MG tablet  [place], [place]

## 2019-02-10 MED ORDER — OXYCODONE-ACETAMINOPHEN 5-325 MG PO TABS: 1.0000 | ORAL_TABLET | Freq: Three times a day (TID) | ORAL | 0 refills | Status: AC | PRN

## 2019-02-10 NOTE — Telephone Encounter (Signed)
I spoke with Ms. Trapani over the phone. The zoster rash is improved, crusted over now. The pain is getting somewhat better, but still function and sleep limiting. I advised that it is encouraging she is having some improvement, I think the pain will improve more over the coming week. We are giving NSAIDs a break after she used them heavily last week. I will approve one more refill of percocet, but I advised her to use them sparingly only if the pain is severe and limiting. She understands.

## 2019-02-28 ENCOUNTER — Telehealth: Payer: Self-pay | Admitting: Dietician

## 2019-02-28 NOTE — Telephone Encounter (Signed)
Sees an eye doctor at [facility] on [address]. Does not think she has been there in a few years. Plans to schedule in a few months after COVID-19 restrictions are lifted.

## 2019-05-12 DIAGNOSIS — Z86010 Personal history of colonic polyps: Secondary | ICD-10-CM | POA: Diagnosis not present

## 2019-05-12 DIAGNOSIS — E119 Type 2 diabetes mellitus without complications: Secondary | ICD-10-CM | POA: Diagnosis not present

## 2019-05-12 DIAGNOSIS — K625 Hemorrhage of anus and rectum: Secondary | ICD-10-CM | POA: Diagnosis not present

## 2019-05-26 ENCOUNTER — Other Ambulatory Visit: Payer: Self-pay

## 2019-05-26 ENCOUNTER — Ambulatory Visit (INDEPENDENT_AMBULATORY_CARE_PROVIDER_SITE_OTHER): Payer: Medicare Other | Admitting: Student in an Organized Health Care Education/Training Program

## 2019-05-26 ENCOUNTER — Encounter: Payer: Self-pay | Admitting: Student in an Organized Health Care Education/Training Program

## 2019-05-26 VITALS — BP 120/57 | HR 62 | Temp 98.2°F | Ht 65.0 in | Wt 166.2 lb

## 2019-05-26 DIAGNOSIS — Z79899 Other long term (current) drug therapy: Secondary | ICD-10-CM

## 2019-05-26 DIAGNOSIS — N189 Chronic kidney disease, unspecified: Secondary | ICD-10-CM | POA: Diagnosis not present

## 2019-05-26 DIAGNOSIS — E785 Hyperlipidemia, unspecified: Secondary | ICD-10-CM

## 2019-05-26 DIAGNOSIS — Z7984 Long term (current) use of oral hypoglycemic drugs: Secondary | ICD-10-CM

## 2019-05-26 DIAGNOSIS — E1122 Type 2 diabetes mellitus with diabetic chronic kidney disease: Secondary | ICD-10-CM | POA: Diagnosis not present

## 2019-05-26 DIAGNOSIS — I10 Essential (primary) hypertension: Secondary | ICD-10-CM

## 2019-05-26 DIAGNOSIS — N183 Chronic kidney disease, stage 3 (moderate): Secondary | ICD-10-CM

## 2019-05-26 DIAGNOSIS — Z0000 Encounter for general adult medical examination without abnormal findings: Secondary | ICD-10-CM

## 2019-05-26 DIAGNOSIS — E1169 Type 2 diabetes mellitus with other specified complication: Secondary | ICD-10-CM

## 2019-05-26 DIAGNOSIS — I129 Hypertensive chronic kidney disease with stage 1 through stage 4 chronic kidney disease, or unspecified chronic kidney disease: Secondary | ICD-10-CM | POA: Diagnosis not present

## 2019-05-26 DIAGNOSIS — E119 Type 2 diabetes mellitus without complications: Secondary | ICD-10-CM

## 2019-05-26 DIAGNOSIS — Z872 Personal history of diseases of the skin and subcutaneous tissue: Secondary | ICD-10-CM

## 2019-05-26 DIAGNOSIS — D631 Anemia in chronic kidney disease: Secondary | ICD-10-CM

## 2019-05-26 LAB — POCT GLYCOSYLATED HEMOGLOBIN (HGB A1C): Hemoglobin A1C: 6.3 % — AB (ref 4.0–5.6)

## 2019-05-26 LAB — GLUCOSE, CAPILLARY: Glucose-Capillary: 139 mg/dL — ABNORMAL HIGH (ref 70–99)

## 2019-05-26 MED ORDER — CHLORTHALIDONE 25 MG PO TABS: 25.0000 mg | ORAL_TABLET | Freq: Every day | ORAL | 3 refills | Status: DC

## 2019-05-26 MED ORDER — SIMVASTATIN 40 MG PO TABS: 40.0000 mg | ORAL_TABLET | Freq: Every day | ORAL | 3 refills | Status: DC

## 2019-05-26 MED ORDER — PIOGLITAZONE HCL 30 MG PO TABS: 30.0000 mg | ORAL_TABLET | Freq: Every day | ORAL | 3 refills | Status: DC

## 2019-05-26 MED ORDER — ZOSTER VAC RECOMB ADJUVANTED 50 MCG/0.5ML IM SUSR: 0.5000 mL | Freq: Once | INTRAMUSCULAR | 1 refills | Status: AC

## 2019-05-26 NOTE — Progress Notes (Signed)
   Assessment and Plan:  See Encounters tab for problem-based medical decision making.   __________________________________________________________  HPI:   73 year old woman here for follow-up of diabetes and hypertension.  Doing very well.  Had an episode of shingles on her right back and chest in March, seen at the urgent care and treated with steroids and antivirals.  Does not ever want to have that happen again.  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  Does not exercise because she feels she is too busy with work.  No recent illnesses, no fevers or chills.  No chest pain or shortness of breath.  Reports a stable diet.  No side effects from her medications, reports good compliance with medications.  Reports compliance with physical distancing guidelines.  __________________________________________________________  Problem List: Patient Active Problem List   Diagnosis Date Noted  . Type 2 diabetes mellitus with other specified complication ([place]) 28/31/5176    Priority: High  . Essential hypertension 09/06/2007    Priority: High  . Anemia of renal disease 05/26/2019    Priority: Low  . Osteopenia 01/28/2018    Priority: Low  . Obesity (BMI 30.0-34.9) 01/28/2018    Priority: Low  . Routine health maintenance 03/20/2012    Priority: Low  . Dyslipidemia 09/06/2007    Priority: Low    Medications: Reconciled today in Epic __________________________________________________________  Physical Exam:  Vital Signs: Vitals:   05/26/19 1008  BP: (!) 120/57  Pulse: 62  Temp: 98.2 F (36.8 C)  TempSrc: Oral  SpO2: 100%  Weight: 166 lb 3.2 oz (75.4 kg)  Height: 5\' 5"  (1.651 m)    Gen: Well appearing, NAD ENT: OP clear without erythema or exudate.  CV: RRR, no murmurs, no lower extremity edema Pulm: Normal effort, CTA throughout, no wheezing Ext: Normal joints

## 2019-05-26 NOTE — Assessment & Plan Note (Signed)
Well-controlled, A1c 6.3%.  Currently using pioglitazone, has been using this for many years.  It is a little unusual to have just the single agent, but this is working well for her.  Plan to continue.  Simvastatin for primary prevention of ischemic disease.

## 2019-05-26 NOTE — Assessment & Plan Note (Signed)
Blood pressure well controlled today.  No side effects from her medication.  Plan to continue chlorthalidone 25 mg daily.  Estimated GFR just a little under 60 last year.  Plan to recheck BMP today.

## 2019-05-26 NOTE — Patient Instructions (Addendum)
Today we talked about your diabetes which is under excellent control.  Please continue using Actos.  We talked about your high blood pressure which is also under great control.  Continue using your his blood pressure medicine.  We talked about the shingles vaccine.  I sent a prescription for the shingles vaccine to your pharmacy.  They can give you 1 dose now, then you should get a second dose 2 months after that.  There is some local soreness at the injection site, you can use ibuprofen 600 mg daily for the soreness.  Get a check your blood work today for your kidney function, electrolytes, blood counts, and cholesterol.  I will send you a letter with the results.    We talked about your sinus problems.  I recommend doing sinus irrigation.  Below are some instructions for how to do sinus irrigation.  Please consider this.  BUFFERED ISOTONIC SALINE NASAL IRRIGATION  The Benefits:  1. When you irrigate, the isotonic saline (salt water) acts as a solvent and washes the mucus crusts and other debris from your nose.  2. This decongests and improves the airflow into your nose. The sinus passages begin to open.  3. Studies have also shown that a salt water and an alkaline (baking soda) irrigation solution improves nasal membrane cell function (mucociliary flow of mucus debris).  The Recipe:  1. Choose a 1-quart glass jar that is thoroughly cleansed.  2. Fill with sterile or distilled water, or you can boil water from the tap.  3. Add 1 to 2 heaping teaspoons of "pickling/canning/sea" salt (NOT table salt as it contains a large number of additives). This salt is available at the grocery store in the food canning section.  4. Add 1 teaspoon of Arm & Hammer Baking Soda (pure bicarbonate).  5. Mix ingredients together and store at room temperature. Discard after one week. If you find this solution too strong, you may decrease the amount of salt added to 1 to 1  teaspoons. With children it is  often best to start with a milder solution and advance slowly. Irrigate with 240 ml (8 oz) twice daily.  The Instructions:  You should plan to irrigate your nose with buffered isotonic saline 2 times per day. Many people prefer to warm the solution slightly in the microwave - but be sure that the solution is NOT HOT. Stand over the sink (some do this in the shower) and squirt the solution into each side of your nose, keeping your mouth open. This allows you to spit the saltwater out of your mouth. It will not harm you if you swallow a little.  If you have been told to use a nasal steroid such as Flonase, Nasonex, or Nasacort, you should always use isortonic saline solution first, then use your nasal steroid product. The nasal steroid is much more effective when sprayed onto clean nasal membranes and the steroid medicine will reach deeper into the nose.  Most people experience a little burning sensation the first few times they use a isotonic saline solution, but this usually goes away within a few days.

## 2019-05-26 NOTE — Assessment & Plan Note (Signed)
Normocytic anemia in 2017.  Asymptomatic.  Likely related to her CKD stage IIIa.  Plan to recheck CBC today.

## 2019-05-26 NOTE — Assessment & Plan Note (Signed)
Plan to check lipids today.  Tolerating simvastatin for primary prevention of ischemic vascular disease well.

## 2019-05-26 NOTE — Assessment & Plan Note (Signed)
Up-to-date on all her health maintenance.  I have ordered a 2 series of Shingrix vaccine for the pharmacy to deliver.

## 2019-05-27 ENCOUNTER — Encounter: Payer: Self-pay | Admitting: Student in an Organized Health Care Education/Training Program

## 2019-05-27 LAB — BMP8+ANION GAP
Anion Gap: 19 mmol/L — ABNORMAL HIGH (ref 10.0–18.0)
BUN/Creatinine Ratio: 19 (ref 12–28)
BUN: 22 mg/dL (ref 8–27)
CO2: 25 mmol/L (ref 20–29)
Calcium: 9.7 mg/dL (ref 8.7–10.3)
Chloride: 100 mmol/L (ref 96–106)
Creatinine, Ser: 1.18 mg/dL — ABNORMAL HIGH (ref 0.57–1.00)
GFR calc Af Amer: 53 mL/min/{1.73_m2} — ABNORMAL LOW (ref >59)
GFR calc non Af Amer: 46 mL/min/{1.73_m2} — ABNORMAL LOW (ref >59)
Glucose: 121 mg/dL — ABNORMAL HIGH (ref 65–99)
Potassium: 3.7 mmol/L (ref 3.5–5.2)
Sodium: 144 mmol/L (ref 134–144)

## 2019-05-27 LAB — CBC
Hematocrit: 36.1 % (ref 34.0–46.6)
Hemoglobin: 12.1 g/dL (ref 11.1–15.9)
MCH: 32.5 pg (ref 26.6–33.0)
MCHC: 33.5 g/dL (ref 31.5–35.7)
MCV: 97 fL (ref 79–97)
Platelets: 216 10*3/uL (ref 150–450)
RBC: 3.72 x10E6/uL — ABNORMAL LOW (ref 3.77–5.28)
RDW: 12.7 % (ref 11.7–15.4)
WBC: 4.6 10*3/uL (ref 3.4–10.8)

## 2019-05-27 LAB — LIPID PANEL
Chol/HDL Ratio: 2.4 ratio (ref 0.0–4.4)
Cholesterol, Total: 180 mg/dL (ref 100–199)
HDL: 75 mg/dL (ref >39)
LDL Calculated: 95 mg/dL (ref 0–99)
Triglycerides: 49 mg/dL (ref 0–149)
VLDL Cholesterol Cal: 10 mg/dL (ref 5–40)

## 2019-06-10 DIAGNOSIS — D123 Benign neoplasm of transverse colon: Secondary | ICD-10-CM | POA: Diagnosis not present

## 2019-06-10 DIAGNOSIS — Z86010 Personal history of colonic polyps: Secondary | ICD-10-CM | POA: Diagnosis not present

## 2019-06-10 DIAGNOSIS — K635 Polyp of colon: Secondary | ICD-10-CM | POA: Diagnosis not present

## 2019-06-18 ENCOUNTER — Encounter (HOSPITAL_COMMUNITY): Payer: Self-pay

## 2019-06-18 ENCOUNTER — Ambulatory Visit (HOSPITAL_COMMUNITY)
Admission: EM | Admit: 2019-06-18 | Discharge: 2019-06-18 | Disposition: A | Discharge status: Home / Self Care | Payer: Medicare Other | Attending: Emergency Medicine | Admitting: Emergency Medicine

## 2019-06-18 ENCOUNTER — Other Ambulatory Visit: Payer: Self-pay

## 2019-06-18 DIAGNOSIS — M5441 Lumbago with sciatica, right side: Secondary | ICD-10-CM | POA: Diagnosis not present

## 2019-06-18 MED ORDER — METHOCARBAMOL 500 MG PO TABS: 500.0000 mg | ORAL_TABLET | Freq: Two times a day (BID) | ORAL | 0 refills | Status: DC

## 2019-06-18 NOTE — ED Triage Notes (Signed)
Pt states she has been moving and now her lower back hurts and the pain is radiating down her to right leg . Pt states the pain is mostly in her right knee.

## 2019-06-18 NOTE — ED Provider Notes (Signed)
[place]    CSN: [id number] Arrival date & time: 06/18/19  1344     History   Chief Complaint Chief Complaint  Patient presents with  . Back Pain    HPI [name] is a 73 y.o. female.   Patient presents with right lower back pain which intermittently radiates to her right knee.  She states she has been moving and believes she strained her back.  She also reports that she walks and stands a lot at work.  She states the pain is worse with squatting and walking ; pain improves with rest.  She denies numbness, paresthesias, weakness in her LE.  She denies dysuria, abdominal pain, pelvic pain, vaginal discharge, or other symptoms.  She has attempted no treatment at home.  The history is provided by the patient.    Past Medical History:  Diagnosis Date  . Diabetes mellitus   . GERD (gastroesophageal reflux disease)   . HLD (hyperlipidemia)   . Hypertension     Patient Active Problem List   Diagnosis Date Noted  . Anemia of renal disease 05/26/2019  . Osteopenia 01/28/2018  . Obesity (BMI 30.0-34.9) 01/28/2018  . Routine health maintenance 03/20/2012  . Type 2 diabetes mellitus with other specified complication ([place]) 63/14/9702  . Dyslipidemia 09/06/2007  . Essential hypertension 09/06/2007    History reviewed. No pertinent surgical history.  OB History   No obstetric history on file.      Home Medications    Prior to Admission medications   Medication Sig Start Date End Date Taking? Authorizing Provider  chlorthalidone (HYGROTON) 25 MG tablet Take 1 tablet (25 mg total) by mouth daily. 05/26/19   Axel Filler, MD  methocarbamol (ROBAXIN) 500 MG tablet Take 1 tablet (500 mg total) by mouth 2 (two) times daily. 06/18/19   Sharion Balloon, NP  pioglitazone (ACTOS) 30 MG tablet Take 1 tablet (30 mg total) by mouth daily. 05/26/19   Axel Filler, MD  simvastatin (ZOCOR) 40 MG tablet Take 1 tablet (40 mg total) by mouth at bedtime. 05/26/19    Axel Filler, MD    Family History Family History  Problem Relation Age of Onset  . Stroke Neg Hx   . Heart disease Neg Hx   . Cancer Neg Hx     Social History Social History   Tobacco Use  . Smoking status: Never Smoker  . Smokeless tobacco: Never Used  Substance Use Topics  . Alcohol use: No  . Drug use: No     Allergies   Metformin   Review of Systems Review of Systems  Constitutional: Negative for chills and fever.  HENT: Negative for ear pain and sore throat.   Eyes: Negative for pain and visual disturbance.  Respiratory: Negative for cough and shortness of breath.   Cardiovascular: Negative for chest pain and palpitations.  Gastrointestinal: Negative for abdominal pain and vomiting.  Genitourinary: Negative for dysuria and hematuria.  Musculoskeletal: Positive for back pain. Negative for arthralgias.  Skin: Negative for color change, rash and wound.  Neurological: Negative for seizures, syncope, weakness and numbness.  All other systems reviewed and are negative.    Physical Exam Triage Vital Signs ED Triage Vitals  Enc Vitals Group     BP 06/18/19 1425 134/72     Pulse Rate 06/18/19 1425 66     Resp 06/18/19 1425 18     Temp 06/18/19 1425 98.5 F (36.9 C)     Temp Source  06/18/19 1425 Temporal     SpO2 06/18/19 1425 100 %     Weight 06/18/19 1424 160 lb (72.6 kg)     Height --      Head Circumference --      Peak Flow --      Pain Score 06/18/19 1424 8     Pain Loc --      Pain Edu? --      Excl. in [place]? --    No data found.  Updated Vital Signs BP 134/72 (BP Location: Right Arm)   Pulse 66   Temp 98.5 F (36.9 C) (Temporal)   Resp 18   Wt 160 lb (72.6 kg)   LMP 04/10/1996   SpO2 100%   BMI 26.63 kg/m   Visual Acuity Right Eye Distance:   Left Eye Distance:   Bilateral Distance:    Right Eye Near:   Left Eye Near:    Bilateral Near:     Physical Exam Vitals signs and nursing note reviewed.  Constitutional:       General: She is not in acute distress.    Appearance: She is well-developed.  HENT:     Head: Normocephalic and atraumatic.  Eyes:     Conjunctiva/sclera: Conjunctivae normal.  Neck:     Musculoskeletal: Neck supple.  Cardiovascular:     Rate and Rhythm: Normal rate and regular rhythm.     Heart sounds: No murmur.  Pulmonary:     Effort: Pulmonary effort is normal. No respiratory distress.     Breath sounds: Normal breath sounds.  Abdominal:     Palpations: Abdomen is soft.     Tenderness: There is no abdominal tenderness. There is no right CVA tenderness, left CVA tenderness, guarding or rebound.  Musculoskeletal:        General: No swelling, tenderness, deformity or signs of injury.  Skin:    General: Skin is warm and dry.     Findings: No bruising, erythema or rash.  Neurological:     General: No focal deficit present.     Mental Status: She is alert and oriented to person, place, and time.     Sensory: No sensory deficit.     Motor: No weakness.     Coordination: Coordination normal.     Gait: Gait normal.     Deep Tendon Reflexes: Reflexes normal.      UC Treatments / Results  Labs (all labs ordered are listed, but only abnormal results are displayed) Labs Reviewed - No data to display  EKG   Radiology No results found.  Procedures Procedures (including critical care time)  Medications Ordered in UC Medications - No data to display  Initial Impression / Assessment and Plan / UC Course  I have reviewed the triage vital signs and the nursing notes.  Pertinent labs & imaging results that were available during my care of the patient were reviewed by me and considered in my medical decision making (see chart for details).   Right lower back pain with sciatica.  Treating with Robaxin and Tylenol.  Patient states she cannot take NSAIDs.  Strict instructions given to patient not to drive, operate machinery, drink alcohol with muscle relaxer; discussed that she  should change positions slowly while taking this and be cautious as it may make her drowsy.  Instructed patient to follow-up with Ortho if her pain continues.  Instructed patient to return here or go to the emergency department if she develops numbness, paresthesias, weakness in  her LE.     Final Clinical Impressions(s) / UC Diagnoses   Final diagnoses:  Acute right-sided low back pain with right-sided sciatica     Discharge Instructions     Take the prescribed muscle relaxer as needed for your back pain; do not drive, operate machinery, or drink alcohol while taking this medication.  You can also take Tylenol as needed.    Follow-up with the orthopedic listed if your pain continues.    Return here or go to the emergency department if you develop numbness, tingling, weakness in your leg or foot.        ED Prescriptions    Medication Sig Dispense Auth. Provider   methocarbamol (ROBAXIN) 500 MG tablet Take 1 tablet (500 mg total) by mouth 2 (two) times daily. 20 tablet Sharion Balloon, NP     Controlled Substance Prescriptions [place] Controlled Substance Registry consulted? Yes, I have consulted the [place] Controlled Substances Registry for this patient, and feel the risk/benefit ratio today is favorable for proceeding with this prescription for a controlled substance.   Sharion Balloon, NP 06/18/19 1446

## 2019-06-18 NOTE — Discharge Instructions (Signed)
Take the prescribed muscle relaxer as needed for your back pain; do not drive, operate machinery, or drink alcohol while taking this medication.  You can also take Tylenol as needed.    Follow-up with the orthopedic listed if your pain continues.    Return here or go to the emergency department if you develop numbness, tingling, weakness in your leg or foot.

## 2019-06-21 DIAGNOSIS — M25561 Pain in right knee: Secondary | ICD-10-CM | POA: Diagnosis not present

## 2019-06-26 ENCOUNTER — Telehealth: Payer: Self-pay | Admitting: Student in an Organized Health Care Education/Training Program

## 2019-06-26 NOTE — Telephone Encounter (Signed)
Pt states a woman and man called her today stating they were affiliated with [facility] and they could check her for diabetes and blood pressure. She could not understand their name and doesn't have a ph# she told them she had just been to her doctor and he takes care of her health. She felt like it was "one of those scam things".

## 2019-06-26 NOTE — Telephone Encounter (Signed)
Pls return pt call [phone]

## 2019-06-26 NOTE — Telephone Encounter (Signed)
I don't know who those folks were. They were not sent by me.

## 2019-08-23 DIAGNOSIS — H40033 Anatomical narrow angle, bilateral: Secondary | ICD-10-CM | POA: Diagnosis not present

## 2019-08-23 DIAGNOSIS — E119 Type 2 diabetes mellitus without complications: Secondary | ICD-10-CM | POA: Diagnosis not present

## 2019-09-02 ENCOUNTER — Ambulatory Visit: Payer: Medicare Other

## 2019-09-10 ENCOUNTER — Other Ambulatory Visit: Payer: Self-pay

## 2019-09-10 ENCOUNTER — Ambulatory Visit (INDEPENDENT_AMBULATORY_CARE_PROVIDER_SITE_OTHER): Payer: Medicare Other | Admitting: *Deleted

## 2019-09-10 DIAGNOSIS — Z23 Encounter for immunization: Secondary | ICD-10-CM

## 2019-11-10 ENCOUNTER — Other Ambulatory Visit: Payer: Self-pay

## 2019-11-10 ENCOUNTER — Ambulatory Visit (HOSPITAL_COMMUNITY)
Admission: EM | Admit: 2019-11-10 | Discharge: 2019-11-10 | Disposition: A | Discharge status: Home / Self Care | Payer: Medicare Other | Attending: Emergency Medicine | Admitting: Emergency Medicine

## 2019-11-10 ENCOUNTER — Encounter (HOSPITAL_COMMUNITY): Payer: Self-pay

## 2019-11-10 DIAGNOSIS — E669 Obesity, unspecified: Secondary | ICD-10-CM | POA: Diagnosis not present

## 2019-11-10 DIAGNOSIS — E119 Type 2 diabetes mellitus without complications: Secondary | ICD-10-CM | POA: Insufficient documentation

## 2019-11-10 DIAGNOSIS — Z6826 Body mass index (BMI) 26.0-26.9, adult: Secondary | ICD-10-CM | POA: Diagnosis not present

## 2019-11-10 DIAGNOSIS — Z888 Allergy status to other drugs, medicaments and biological substances status: Secondary | ICD-10-CM | POA: Insufficient documentation

## 2019-11-10 DIAGNOSIS — I10 Essential (primary) hypertension: Secondary | ICD-10-CM | POA: Insufficient documentation

## 2019-11-10 DIAGNOSIS — Z79899 Other long term (current) drug therapy: Secondary | ICD-10-CM | POA: Insufficient documentation

## 2019-11-10 DIAGNOSIS — Z7984 Long term (current) use of oral hypoglycemic drugs: Secondary | ICD-10-CM | POA: Diagnosis not present

## 2019-11-10 DIAGNOSIS — E785 Hyperlipidemia, unspecified: Secondary | ICD-10-CM | POA: Insufficient documentation

## 2019-11-10 DIAGNOSIS — J029 Acute pharyngitis, unspecified: Secondary | ICD-10-CM | POA: Diagnosis not present

## 2019-11-10 DIAGNOSIS — Z20828 Contact with and (suspected) exposure to other viral communicable diseases: Secondary | ICD-10-CM | POA: Diagnosis not present

## 2019-11-10 MED ORDER — CETIRIZINE HCL 10 MG PO TABS: 10.0000 mg | ORAL_TABLET | Freq: Every day | ORAL | 1 refills | Status: DC

## 2019-11-10 NOTE — Discharge Instructions (Addendum)
Discussed that possible sinus drainage can irritate throat  Take allergy meds daily avoid any sudafed this may raise your blood pressure.  If symptoms are not better may go to see ENT Completed a COvid screening and will have the results back in 5 days.  May take tyelnol for pain as needed

## 2019-11-10 NOTE — ED Provider Notes (Signed)
[place]    CSN: [id number] Arrival date & time: 11/10/19  1841      History   Chief Complaint Chief Complaint  Patient presents with  . Sore Throat    HPI [name] is a 73 y.o. female.   C/o sore throat for 2 weeks not  [place] states that it is worse when awaking in the am. Denies any chest pain, no reflux issues. Has not taken anything pta . Denies any fevers, no sob, no cough, does state that she has nasal drainage.      Past Medical History:  Diagnosis Date  . Diabetes mellitus   . GERD (gastroesophageal reflux disease)   . HLD (hyperlipidemia)   . Hypertension     Patient Active Problem List   Diagnosis Date Noted  . Anemia of renal disease 05/26/2019  . Osteopenia 01/28/2018  . Obesity (BMI 30.0-34.9) 01/28/2018  . Routine health maintenance 03/20/2012  . Type 2 diabetes mellitus with other specified complication ([place]) 123XX123  . Dyslipidemia 09/06/2007  . Essential hypertension 09/06/2007    History reviewed. No pertinent surgical history.  OB History   No obstetric history on file.      Home Medications    Prior to Admission medications   Medication Sig Start Date End Date Taking? Authorizing Provider  cetirizine (ZYRTEC ALLERGY) 10 MG tablet Take 1 tablet (10 mg total) by mouth daily. 11/10/19   Marney Setting, NP  chlorthalidone (HYGROTON) 25 MG tablet Take 1 tablet (25 mg total) by mouth daily. 05/26/19   Axel Filler, MD  methocarbamol (ROBAXIN) 500 MG tablet Take 1 tablet (500 mg total) by mouth 2 (two) times daily. 06/18/19   Sharion Balloon, NP  pioglitazone (ACTOS) 30 MG tablet Take 1 tablet (30 mg total) by mouth daily. 05/26/19   Axel Filler, MD  simvastatin (ZOCOR) 40 MG tablet Take 1 tablet (40 mg total) by mouth at bedtime. 05/26/19   Axel Filler, MD    Family History Family History  Problem Relation Age of Onset  . Stroke Neg Hx   . Heart disease Neg Hx   . Cancer Neg Hx     Social History Social History   Tobacco Use  . Smoking status: Never Smoker  . Smokeless tobacco: Never Used  Substance Use Topics  . Alcohol use: No  . Drug use: No     Allergies   Metformin   Review of Systems Review of Systems  Constitutional: Negative.   HENT: Positive for congestion and sore throat.   Eyes: Negative.   Respiratory: Negative.   Cardiovascular: Negative.   Gastrointestinal: Negative.   Skin: Negative.   Neurological: Negative.      Physical Exam Triage Vital Signs ED Triage Vitals  Enc Vitals Group     BP 11/10/19 1858 (!) 144/67     Pulse Rate 11/10/19 1858 68     Resp 11/10/19 1858 18     Temp 11/10/19 1858 98.6 F (37 C)     Temp Source 11/10/19 1858 Oral     SpO2 11/10/19 1858 100 %     Weight 11/10/19 1857 160 lb (72.6 kg)     Height --      Head Circumference --      Peak Flow --      Pain Score 11/10/19 1856 0     Pain Loc --      Pain Edu? --      Excl. in  [place]? --    No data found.  Updated Vital Signs BP (!) 144/67 (BP Location: Right Arm)   Pulse 68   Temp 98.6 F (37 C) (Oral)   Resp 18   Wt 160 lb (72.6 kg)   LMP 04/10/1996   SpO2 100%   BMI 26.63 kg/m   Visual Acuity Right Eye Distance:   Left Eye Distance:   Bilateral Distance:    Right Eye Near:   Left Eye Near:    Bilateral Near:     Physical Exam HENT:     Head: Normocephalic.     Right Ear: Tympanic membrane normal.     Left Ear: Tympanic membrane normal.     Mouth/Throat:     Mouth: Mucous membranes are dry.     Tonsils: 0 on the right. 0 on the left.  Eyes:     Conjunctiva/sclera: Conjunctivae normal.  Cardiovascular:     Rate and Rhythm: Normal rate.  Pulmonary:     Effort: Pulmonary effort is normal.  Musculoskeletal:     Cervical back: Normal range of motion.  Skin:    General: Skin is warm.  Neurological:     Mental Status: She is alert.      UC Treatments / Results  Labs (all labs ordered are listed, but only abnormal  results are displayed) Labs Reviewed  NOVEL CORONAVIRUS, NAA (HOSP ORDER, SEND-OUT TO REF LAB; TAT 18-24 HRS)    EKG   Radiology No results found.  Procedures Procedures (including critical care time)  Medications Ordered in UC Medications - No data to display  Initial Impression / Assessment and Plan / UC Course  I have reviewed the triage vital signs and the nursing notes.  Pertinent labs & imaging results that were available during my care of the patient were reviewed by me and considered in my medical decision making (see chart for details).    Discussed that possible sinus drainage can irritate throat  Take allergy meds daily avoid any sudafed this may raise your blood pressure.  If symptoms are not better may go to see ENT Completed a COvid screening and will have the results back in 5 days.  May take tyelnol for pain as needed   Final Clinical Impressions(s) / UC Diagnoses   Final diagnoses:  Sore throat     Discharge Instructions     Discussed that possible sinus drainage can irritate throat  Take allergy meds daily avoid any sudafed this may raise your blood pressure.  If symptoms are not better may go to see ENT Completed a COvid screening and will have the results back in 5 days.  May take tyelnol for pain as needed      ED Prescriptions    Medication Sig Dispense Auth. Provider   cetirizine (ZYRTEC ALLERGY) 10 MG tablet Take 1 tablet (10 mg total) by mouth daily. 30 tablet Marney Setting, NP     PDMP not reviewed this encounter.   Marney Setting, NP 11/10/19 [phone]

## 2019-11-10 NOTE — ED Triage Notes (Signed)
Pt states she has a stretchy throat . Pt states she had a test last week. Pt states she dose not have a sore throat.

## 2019-11-11 LAB — NOVEL CORONAVIRUS, NAA (HOSP ORDER, SEND-OUT TO REF LAB; TAT 18-24 HRS): SARS-CoV-2, NAA: NOT DETECTED

## 2019-11-23 DIAGNOSIS — Z0389 Encounter for observation for other suspected diseases and conditions ruled out: Secondary | ICD-10-CM | POA: Diagnosis not present

## 2019-11-23 DIAGNOSIS — R05 Cough: Secondary | ICD-10-CM | POA: Diagnosis not present

## 2019-11-24 ENCOUNTER — Ambulatory Visit: Payer: Medicare Other | Admitting: Student in an Organized Health Care Education/Training Program

## 2019-11-27 ENCOUNTER — Encounter (HOSPITAL_COMMUNITY): Payer: Self-pay

## 2019-11-27 ENCOUNTER — Emergency Department (HOSPITAL_COMMUNITY): Payer: Medicare Other

## 2019-11-27 ENCOUNTER — Emergency Department (HOSPITAL_COMMUNITY)
Admission: EM | Admit: 2019-11-27 | Discharge: 2019-11-27 | Disposition: A | Discharge status: Home / Self Care | Payer: Medicare Other | Attending: Neurology | Admitting: Neurology

## 2019-11-27 ENCOUNTER — Other Ambulatory Visit: Payer: Self-pay

## 2019-11-27 DIAGNOSIS — R05 Cough: Secondary | ICD-10-CM | POA: Insufficient documentation

## 2019-11-27 DIAGNOSIS — R112 Nausea with vomiting, unspecified: Secondary | ICD-10-CM | POA: Diagnosis not present

## 2019-11-27 DIAGNOSIS — E119 Type 2 diabetes mellitus without complications: Secondary | ICD-10-CM | POA: Insufficient documentation

## 2019-11-27 DIAGNOSIS — M7918 Myalgia, other site: Secondary | ICD-10-CM | POA: Diagnosis not present

## 2019-11-27 DIAGNOSIS — M791 Myalgia, unspecified site: Secondary | ICD-10-CM | POA: Diagnosis not present

## 2019-11-27 DIAGNOSIS — U071 COVID-19: Secondary | ICD-10-CM | POA: Diagnosis not present

## 2019-11-27 DIAGNOSIS — B349 Viral infection, unspecified: Secondary | ICD-10-CM | POA: Diagnosis not present

## 2019-11-27 DIAGNOSIS — R1110 Vomiting, unspecified: Secondary | ICD-10-CM

## 2019-11-27 DIAGNOSIS — I10 Essential (primary) hypertension: Secondary | ICD-10-CM | POA: Diagnosis not present

## 2019-11-27 DIAGNOSIS — R110 Nausea: Secondary | ICD-10-CM

## 2019-11-27 DIAGNOSIS — Z20822 Contact with and (suspected) exposure to covid-19: Secondary | ICD-10-CM | POA: Insufficient documentation

## 2019-11-27 DIAGNOSIS — Z743 Need for continuous supervision: Secondary | ICD-10-CM | POA: Diagnosis not present

## 2019-11-27 DIAGNOSIS — R531 Weakness: Secondary | ICD-10-CM | POA: Diagnosis not present

## 2019-11-27 LAB — CBC WITH DIFFERENTIAL/PLATELET
Abs Immature Granulocytes: 0.1 10*3/uL — ABNORMAL HIGH (ref 0.00–0.07)
Basophils Absolute: 0 10*3/uL (ref 0.0–0.1)
Basophils Relative: 1 %
Eosinophils Absolute: 0 10*3/uL (ref 0.0–0.5)
Eosinophils Relative: 0 %
HCT: 40.2 % (ref 36.0–46.0)
Hemoglobin: 13 g/dL (ref 12.0–15.0)
Immature Granulocytes: 2 %
Lymphocytes Relative: 29 %
Lymphs Abs: 1.4 10*3/uL (ref 0.7–4.0)
MCH: 31.3 pg (ref 26.0–34.0)
MCHC: 32.3 g/dL (ref 30.0–36.0)
MCV: 96.6 fL (ref 80.0–100.0)
Monocytes Absolute: 0.2 10*3/uL (ref 0.1–1.0)
Monocytes Relative: 5 %
Neutro Abs: 3 10*3/uL (ref 1.7–7.7)
Neutrophils Relative %: 63 %
Platelets: 146 10*3/uL — ABNORMAL LOW (ref 150–400)
RBC: 4.16 MIL/uL (ref 3.87–5.11)
RDW: 13.9 % (ref 11.5–15.5)
WBC: 4.8 10*3/uL (ref 4.0–10.5)
nRBC: 0 % (ref 0.0–0.2)

## 2019-11-27 LAB — COMPREHENSIVE METABOLIC PANEL
ALT: 14 U/L (ref 0–44)
AST: 36 U/L (ref 15–41)
Albumin: 3.7 g/dL (ref 3.5–5.0)
Alkaline Phosphatase: 55 U/L (ref 38–126)
Anion gap: 12 (ref 5–15)
BUN: 17 mg/dL (ref 8–23)
CO2: 28 mmol/L (ref 22–32)
Calcium: 8.9 mg/dL (ref 8.9–10.3)
Chloride: 92 mmol/L — ABNORMAL LOW (ref 98–111)
Creatinine, Ser: 1.42 mg/dL — ABNORMAL HIGH (ref 0.44–1.00)
GFR calc Af Amer: 42 mL/min — ABNORMAL LOW (ref >60)
GFR calc non Af Amer: 37 mL/min — ABNORMAL LOW (ref >60)
Glucose, Bld: 108 mg/dL — ABNORMAL HIGH (ref 70–99)
Potassium: 3.6 mmol/L (ref 3.5–5.1)
Sodium: 132 mmol/L — ABNORMAL LOW (ref 135–145)
Total Bilirubin: 1.3 mg/dL — ABNORMAL HIGH (ref 0.3–1.2)
Total Protein: 7.9 g/dL (ref 6.5–8.1)

## 2019-11-27 LAB — URINALYSIS, ROUTINE W REFLEX MICROSCOPIC
Bacteria, UA: NONE SEEN
Bilirubin Urine: NEGATIVE
Glucose, UA: NEGATIVE mg/dL
Ketones, ur: NEGATIVE mg/dL
Leukocytes,Ua: NEGATIVE
Nitrite: NEGATIVE
Protein, ur: NEGATIVE mg/dL
Specific Gravity, Urine: 1.009 (ref 1.005–1.030)
pH: 6 (ref 5.0–8.0)

## 2019-11-27 LAB — RESPIRATORY PANEL BY RT PCR (FLU A&B, COVID)
Influenza A by PCR: NEGATIVE
Influenza B by PCR: NEGATIVE
SARS Coronavirus 2 by RT PCR: NEGATIVE

## 2019-11-27 LAB — MONONUCLEOSIS SCREEN: Mono Screen: NEGATIVE

## 2019-11-27 MED ORDER — DM-GUAIFENESIN ER 30-600 MG PO TB12: 1.0000 | ORAL_TABLET | Freq: Two times a day (BID) | ORAL | Status: DC

## 2019-11-27 MED ORDER — BENZONATATE 100 MG PO CAPS: 100.0000 mg | ORAL_CAPSULE | Freq: Three times a day (TID) | ORAL | 0 refills | Status: DC

## 2019-11-27 MED ORDER — ACETAMINOPHEN 500 MG PO TABS
500.0000 mg | ORAL_TABLET | Freq: Once | ORAL | Status: AC
  Administered 2019-11-27: 500 mg via ORAL
  Filled 2019-11-27: qty 1

## 2019-11-27 MED ORDER — SODIUM CHLORIDE 0.9 % IV BOLUS
1000.0000 mL | Freq: Once | INTRAVENOUS | Status: AC
  Administered 2019-11-27: 13:00:00 1000 mL via INTRAVENOUS

## 2019-11-27 MED ORDER — ACETAMINOPHEN 325 MG PO TABS
650.0000 mg | ORAL_TABLET | Freq: Once | ORAL | Status: DC
  Filled 2019-11-27: qty 2

## 2019-11-27 MED ORDER — ONDANSETRON HCL 4 MG/2ML IJ SOLN
4.0000 mg | Freq: Once | INTRAMUSCULAR | Status: AC
  Administered 2019-11-27: 13:00:00 4 mg via INTRAVENOUS
  Filled 2019-11-27: qty 2

## 2019-11-27 NOTE — ED Provider Notes (Signed)
[facility] EMERGENCY DEPARTMENT Provider Note   CSN: [id number] Arrival date & time: 11/27/19  1137     History Chief Complaint  Patient presents with  . COVID +  . Nausea  . Generalized Body Aches    [name] is a 74 y.o. female with history of hypertension and diabetes who presents with generalized weakness. She states that she started feeling bad approximately 3 weeks ago. She had a negative COVID test on 12/28 and on 1/4. She reports feeling generally weak and has body aches. She hasn't been eating or drinking much. She has been having fever, chills, dry cough, anosmia. She has had an episode of diarrhea which started today. She denies chest pain, SOB, abdominal pain, N/V, or urinary symptoms. She came to the ED today because she just feels bad.  HPI     Past Medical History:  Diagnosis Date  . Diabetes mellitus   . GERD (gastroesophageal reflux disease)   . HLD (hyperlipidemia)   . Hypertension     Patient Active Problem List   Diagnosis Date Noted  . Anemia of renal disease 05/26/2019  . Osteopenia 01/28/2018  . Obesity (BMI 30.0-34.9) 01/28/2018  . Routine health maintenance 03/20/2012  . Type 2 diabetes mellitus with other specified complication ([place]) 123XX123  . Dyslipidemia 09/06/2007  . Essential hypertension 09/06/2007    History reviewed. No pertinent surgical history.   OB History   No obstetric history on file.     Family History  Problem Relation Age of Onset  . Stroke Neg Hx   . Heart disease Neg Hx   . Cancer Neg Hx     Social History   Tobacco Use  . Smoking status: Never Smoker  . Smokeless tobacco: Never Used  Substance Use Topics  . Alcohol use: No  . Drug use: No    Home Medications Prior to Admission medications   Medication Sig Start Date End Date Taking? Authorizing Provider  cetirizine (ZYRTEC ALLERGY) 10 MG tablet Take 1 tablet (10 mg total) by mouth daily. 11/10/19   Marney Setting, NP    chlorthalidone (HYGROTON) 25 MG tablet Take 1 tablet (25 mg total) by mouth daily. 05/26/19   Axel Filler, MD  methocarbamol (ROBAXIN) 500 MG tablet Take 1 tablet (500 mg total) by mouth 2 (two) times daily. 06/18/19   Sharion Balloon, NP  pioglitazone (ACTOS) 30 MG tablet Take 1 tablet (30 mg total) by mouth daily. 05/26/19   Axel Filler, MD  simvastatin (ZOCOR) 40 MG tablet Take 1 tablet (40 mg total) by mouth at bedtime. 05/26/19   Axel Filler, MD    Allergies    Metformin  Review of Systems   Review of Systems  Constitutional: Positive for appetite change and fever.       +malaise  Respiratory: Positive for cough. Negative for shortness of breath.   Cardiovascular: Negative for chest pain.  Gastrointestinal: Negative for abdominal pain, diarrhea, nausea and vomiting.  Genitourinary: Negative for dysuria and flank pain.  Musculoskeletal: Positive for myalgias.  Neurological: Negative for syncope and headaches.  All other systems reviewed and are negative.   Physical Exam Updated Vital Signs BP 139/66 (BP Location: Right Arm)   Pulse 83   Temp (!) 101.9 F (38.8 C) (Oral)   Resp 17   LMP 04/10/1996   SpO2 98%   Physical Exam Vitals and nursing note reviewed.  Constitutional:      General: She is not  in acute distress.    Appearance: Normal appearance. She is well-developed. She is not ill-appearing.     Comments: Elderly female in NAD. Appears fatigued.  HENT:     Head: Normocephalic and atraumatic.  Eyes:     General: No scleral icterus.       Right eye: No discharge.        Left eye: No discharge.     Conjunctiva/sclera: Conjunctivae normal.     Pupils: Pupils are equal, round, and reactive to light.  Cardiovascular:     Rate and Rhythm: Normal rate and regular rhythm.  Pulmonary:     Effort: Pulmonary effort is normal. No respiratory distress.     Breath sounds: Normal breath sounds.     Comments: Wheezy dry cough Abdominal:      General: There is no distension.     Palpations: Abdomen is soft.     Tenderness: There is no abdominal tenderness.  Musculoskeletal:     Cervical back: Normal range of motion.  Skin:    General: Skin is warm and dry.  Neurological:     Mental Status: She is alert and oriented to person, place, and time.  Psychiatric:        Behavior: Behavior normal.     ED Results / Procedures / Treatments   Labs (all labs ordered are listed, but only abnormal results are displayed) Labs Reviewed  CBC WITH DIFFERENTIAL/PLATELET - Abnormal; Notable for the following components:      Result Value   Platelets 146 (*)    Abs Immature Granulocytes 0.10 (*)    All other components within normal limits  COMPREHENSIVE METABOLIC PANEL - Abnormal; Notable for the following components:   Sodium 132 (*)    Chloride 92 (*)    Glucose, Bld 108 (*)    Creatinine, Ser 1.42 (*)    Total Bilirubin 1.3 (*)    GFR calc non Af Amer 37 (*)    GFR calc Af Amer 42 (*)    All other components within normal limits  RESPIRATORY PANEL BY RT PCR (FLU A&B, COVID)  CULTURE, BLOOD (ROUTINE X 2)  CULTURE, BLOOD (ROUTINE X 2)  URINE CULTURE  URINALYSIS, ROUTINE W REFLEX MICROSCOPIC  MONONUCLEOSIS SCREEN    EKG EKG Interpretation  Date/Time:  Thursday November 27 2019 11:41:37 EST Ventricular Rate:  87 PR Interval:    QRS Duration: 89 QT Interval:  362 QTC Calculation: 436 R Axis:   86 Text Interpretation: Sinus rhythm Borderline right axis deviation No acute changes No significant change since last tracing Confirmed by Varney Biles [id number]) on 11/27/2019 1:14:04 PM   Radiology DG Chest Port 1 View  Result Date: 11/27/2019 CLINICAL DATA:  Cough EXAM: PORTABLE CHEST 1 VIEW COMPARISON:  Chest radiograph 03/28/2013 FINDINGS: Heart size within normal limits. No airspace consolidation. No evidence of pleural effusion or pneumothorax. There is a 10 mm nodular opacity which projects in the region of the lateral left  lower lung (see annotation on image). No acute bony abnormality. Overlying cardiac monitoring leads. IMPRESSION: 10 mm nodular opacity projecting in the region of the lateral left lower lung. Nonemergent chest CT is recommended to exclude a pulmonary nodule at this site. No airspace consolidation. Electronically Signed   By: Kellie Simmering DO   On: 11/27/2019 14:04    Procedures Procedures (including critical care time)  Medications Ordered in ED Medications  acetaminophen (TYLENOL) tablet 650 mg (650 mg Oral Refused 11/27/19 1321)  sodium chloride 0.9 %  bolus 1,000 mL (1,000 mLs Intravenous Bolus from Bag 11/27/19 1321)  ondansetron (ZOFRAN) injection 4 mg (4 mg Intravenous Given 11/27/19 1321)    ED Course  I have reviewed the triage vital signs and the nursing notes.  Pertinent labs & imaging results that were available during my care of the patient were reviewed by me and considered in my medical decision making (see chart for details).  74 year old female presents with fever, malaise, poor PO intake, cough. She is febrile here but otherwise vitals are reassuring. Exam is consistent with viral illness. EKG is SR. Will order labs, UA, CXR. Will order COVID test since she states she is positive but I don't see a positive test.  CBC is remarkable for mild thrombocytopenia (146). Diff shows lymphocyte predominance. CMP is remarkable for mild hyponatremia (132), elevated SCr (1.4). Shared visit with Dr. Kathrynn Humble. COVID is negative. CXR shows 65mm pulmonary nodule but no pneumonia. COVID and flu are negative. Mono added on. UA is still pending.  At shift change mono, UA, po challenge, and ambulation challenge are pending. Care signed out to Carroll Kinds, MD who will dispo. If she is able to tolerate PO and ambulate would d/c home with PCP f/u and symptomatic meds. If she cannot walk or has persistent vomiting would admit for obs.  MDM Rules/Calculators/A&P                       Final Clinical  Impression(s) / ED Diagnoses Final diagnoses:  Viral syndrome    Rx / DC Orders ED Discharge Orders    None       Recardo Evangelist, PA-C 11/27/19 [place], MD 11/28/19 [phone]

## 2019-11-27 NOTE — ED Triage Notes (Signed)
Pt from home with ems for c.o generalized body aches, fatigue and nausea. Pt tested positive for COVID 1/4. Pt a.o, resp e.u, 98% on room air. CBG 163

## 2019-11-27 NOTE — ED Provider Notes (Signed)
Transfer of Care Note I assumed care of [name] on 11/27/2019  Briefly, [name] is a 74 y.o. female who:  Presents with fatigue, fever , nausea  The plan includes: F/u urine, monospot, amblulate and po challenge  Please refer to the original provider's note for additional information regarding the care of Justin Mend.  ### Reassessment: I personally reassessed the patient:  Vital Signs:  The most current vitals were  Vitals:   11/27/19 1720 11/27/19 1730  BP: 117/65 (!) 120/56  Pulse:  75  Resp: (!) 25 15  Temp:    SpO2:  94%    Hemodynamics:  The patient is hemodynamically stable. Mental Status:  The patient is Alert  Additional MDM: Urine looks noninfected, patient ambulated well at her baseline, tolerating p.o. well.  Patient would like to go home, I think this is reasonable.  Blood culture sent, pending.  Patient discharged home with [place].  Return precautions were given, patient will follow up with PCP for her symptoms, likely viral illness.  Vital signs stable upon my reassessment and exam.  Discharged in good condition.  The attending physician was present and available for all medical decision making and procedures related to this patient's care.    Kizzie Fantasia, MD 11/27/19 1744    Sherwood Gambler, MD 11/27/19 2229

## 2019-11-29 LAB — URINE CULTURE

## 2019-11-30 ENCOUNTER — Emergency Department (HOSPITAL_COMMUNITY): Payer: Medicare Other

## 2019-11-30 ENCOUNTER — Encounter (HOSPITAL_COMMUNITY): Payer: Self-pay | Admitting: Pharmacy Technician

## 2019-11-30 ENCOUNTER — Inpatient Hospital Stay (HOSPITAL_COMMUNITY)
Admission: EM | Admit: 2019-11-30 | Discharge: 2019-12-02 | DRG: 193 | Disposition: A | Discharge status: Home / Self Care | Payer: Medicare Other | Attending: Student in an Organized Health Care Education/Training Program | Admitting: Student in an Organized Health Care Education/Training Program

## 2019-11-30 ENCOUNTER — Other Ambulatory Visit: Payer: Self-pay

## 2019-11-30 DIAGNOSIS — Z743 Need for continuous supervision: Secondary | ICD-10-CM | POA: Diagnosis not present

## 2019-11-30 DIAGNOSIS — Z20822 Contact with and (suspected) exposure to covid-19: Secondary | ICD-10-CM | POA: Diagnosis not present

## 2019-11-30 DIAGNOSIS — R05 Cough: Secondary | ICD-10-CM | POA: Diagnosis not present

## 2019-11-30 DIAGNOSIS — E860 Dehydration: Secondary | ICD-10-CM | POA: Diagnosis present

## 2019-11-30 DIAGNOSIS — E119 Type 2 diabetes mellitus without complications: Secondary | ICD-10-CM | POA: Diagnosis not present

## 2019-11-30 DIAGNOSIS — Z79899 Other long term (current) drug therapy: Secondary | ICD-10-CM | POA: Diagnosis not present

## 2019-11-30 DIAGNOSIS — E1122 Type 2 diabetes mellitus with diabetic chronic kidney disease: Secondary | ICD-10-CM | POA: Diagnosis present

## 2019-11-30 DIAGNOSIS — R197 Diarrhea, unspecified: Secondary | ICD-10-CM | POA: Diagnosis not present

## 2019-11-30 DIAGNOSIS — I129 Hypertensive chronic kidney disease with stage 1 through stage 4 chronic kidney disease, or unspecified chronic kidney disease: Secondary | ICD-10-CM | POA: Diagnosis not present

## 2019-11-30 DIAGNOSIS — J159 Unspecified bacterial pneumonia: Secondary | ICD-10-CM | POA: Diagnosis not present

## 2019-11-30 DIAGNOSIS — E876 Hypokalemia: Secondary | ICD-10-CM

## 2019-11-30 DIAGNOSIS — J9601 Acute respiratory failure with hypoxia: Secondary | ICD-10-CM | POA: Diagnosis present

## 2019-11-30 DIAGNOSIS — E1169 Type 2 diabetes mellitus with other specified complication: Secondary | ICD-10-CM | POA: Diagnosis present

## 2019-11-30 DIAGNOSIS — E785 Hyperlipidemia, unspecified: Secondary | ICD-10-CM | POA: Diagnosis not present

## 2019-11-30 DIAGNOSIS — N1831 Chronic kidney disease, stage 3a: Secondary | ICD-10-CM | POA: Diagnosis present

## 2019-11-30 DIAGNOSIS — K219 Gastro-esophageal reflux disease without esophagitis: Secondary | ICD-10-CM | POA: Diagnosis present

## 2019-11-30 DIAGNOSIS — E871 Hypo-osmolality and hyponatremia: Secondary | ICD-10-CM | POA: Diagnosis not present

## 2019-11-30 DIAGNOSIS — J189 Pneumonia, unspecified organism: Secondary | ICD-10-CM | POA: Diagnosis present

## 2019-11-30 DIAGNOSIS — R531 Weakness: Secondary | ICD-10-CM

## 2019-11-30 DIAGNOSIS — Z888 Allergy status to other drugs, medicaments and biological substances status: Secondary | ICD-10-CM | POA: Diagnosis not present

## 2019-11-30 DIAGNOSIS — N179 Acute kidney failure, unspecified: Secondary | ICD-10-CM | POA: Diagnosis present

## 2019-11-30 DIAGNOSIS — R1084 Generalized abdominal pain: Secondary | ICD-10-CM | POA: Diagnosis not present

## 2019-11-30 DIAGNOSIS — E861 Hypovolemia: Secondary | ICD-10-CM | POA: Diagnosis present

## 2019-11-30 DIAGNOSIS — I10 Essential (primary) hypertension: Secondary | ICD-10-CM | POA: Diagnosis not present

## 2019-11-30 DIAGNOSIS — R079 Chest pain, unspecified: Secondary | ICD-10-CM | POA: Diagnosis not present

## 2019-11-30 LAB — CBC
HCT: 38.2 % (ref 36.0–46.0)
Hemoglobin: 12.4 g/dL (ref 12.0–15.0)
MCH: 30.8 pg (ref 26.0–34.0)
MCHC: 32.5 g/dL (ref 30.0–36.0)
MCV: 94.8 fL (ref 80.0–100.0)
Platelets: 200 10*3/uL (ref 150–400)
RBC: 4.03 MIL/uL (ref 3.87–5.11)
RDW: 13.4 % (ref 11.5–15.5)
WBC: 5.3 10*3/uL (ref 4.0–10.5)
nRBC: 0 % (ref 0.0–0.2)

## 2019-11-30 LAB — COMPREHENSIVE METABOLIC PANEL
ALT: 19 U/L (ref 0–44)
AST: 41 U/L (ref 15–41)
Albumin: 3.3 g/dL — ABNORMAL LOW (ref 3.5–5.0)
Alkaline Phosphatase: 45 U/L (ref 38–126)
Anion gap: 14 (ref 5–15)
BUN: 21 mg/dL (ref 8–23)
CO2: 26 mmol/L (ref 22–32)
Calcium: 8.7 mg/dL — ABNORMAL LOW (ref 8.9–10.3)
Chloride: 92 mmol/L — ABNORMAL LOW (ref 98–111)
Creatinine, Ser: 1.7 mg/dL — ABNORMAL HIGH (ref 0.44–1.00)
GFR calc Af Amer: 34 mL/min — ABNORMAL LOW (ref >60)
GFR calc non Af Amer: 29 mL/min — ABNORMAL LOW (ref >60)
Glucose, Bld: 131 mg/dL — ABNORMAL HIGH (ref 70–99)
Potassium: 3 mmol/L — ABNORMAL LOW (ref 3.5–5.1)
Sodium: 132 mmol/L — ABNORMAL LOW (ref 135–145)
Total Bilirubin: 1.6 mg/dL — ABNORMAL HIGH (ref 0.3–1.2)
Total Protein: 7.3 g/dL (ref 6.5–8.1)

## 2019-11-30 LAB — POC SARS CORONAVIRUS 2 AG -  ED: SARS Coronavirus 2 Ag: NEGATIVE

## 2019-11-30 LAB — GLUCOSE, CAPILLARY: Glucose-Capillary: 103 mg/dL — ABNORMAL HIGH (ref 70–99)

## 2019-11-30 LAB — RESPIRATORY PANEL BY RT PCR (FLU A&B, COVID)
Influenza A by PCR: NEGATIVE
Influenza B by PCR: NEGATIVE
SARS Coronavirus 2 by RT PCR: NEGATIVE

## 2019-11-30 LAB — LIPASE, BLOOD: Lipase: 44 U/L (ref 11–51)

## 2019-11-30 MED ORDER — POTASSIUM CHLORIDE 10 MEQ/100ML IV SOLN
10.0000 meq | INTRAVENOUS | Status: AC
  Administered 2019-11-30 (×3): 10 meq via INTRAVENOUS
  Filled 2019-11-30 (×3): qty 100

## 2019-11-30 MED ORDER — METHOCARBAMOL 500 MG PO TABS
500.0000 mg | ORAL_TABLET | Freq: Two times a day (BID) | ORAL | Status: DC | PRN
  Administered 2019-12-01: 08:00:00 500 mg via ORAL
  Filled 2019-11-30: qty 1

## 2019-11-30 MED ORDER — DEXTROSE 5 % IV SOLN
250.0000 mg | INTRAVENOUS | Status: DC
  Administered 2019-12-01: 250 mg via INTRAVENOUS
  Filled 2019-11-30 (×2): qty 250

## 2019-11-30 MED ORDER — INSULIN ASPART 100 UNIT/ML ~~LOC~~ SOLN: 0.0000 [IU] | Freq: Three times a day (TID) | SUBCUTANEOUS | Status: DC

## 2019-11-30 MED ORDER — SODIUM CHLORIDE 0.9 % IV SOLN
2.0000 g | INTRAVENOUS | Status: DC
  Administered 2019-12-01: 19:00:00 2 g via INTRAVENOUS
  Filled 2019-11-30: qty 2
  Filled 2019-11-30: qty 20

## 2019-11-30 MED ORDER — SODIUM CHLORIDE 0.9% FLUSH
3.0000 mL | Freq: Once | INTRAVENOUS | Status: AC
  Administered 2019-11-30: 18:00:00 3 mL via INTRAVENOUS

## 2019-11-30 MED ORDER — SIMVASTATIN 20 MG PO TABS
40.0000 mg | ORAL_TABLET | Freq: Every day | ORAL | Status: DC
  Administered 2019-11-30: 23:00:00 40 mg via ORAL
  Filled 2019-11-30 (×2): qty 2

## 2019-11-30 MED ORDER — ENOXAPARIN SODIUM 40 MG/0.4ML ~~LOC~~ SOLN
40.0000 mg | SUBCUTANEOUS | Status: DC
  Administered 2019-11-30 – 2019-12-01 (×2): 40 mg via SUBCUTANEOUS
  Filled 2019-11-30 (×2): qty 0.4

## 2019-11-30 MED ORDER — SODIUM CHLORIDE 0.9 % IV SOLN
500.0000 mg | Freq: Once | INTRAVENOUS | Status: AC
  Administered 2019-11-30: 500 mg via INTRAVENOUS
  Filled 2019-11-30: qty 500

## 2019-11-30 MED ORDER — SODIUM CHLORIDE 0.9 % IV SOLN: INTRAVENOUS | Status: AC

## 2019-11-30 MED ORDER — ACETAMINOPHEN 325 MG PO TABS
650.0000 mg | ORAL_TABLET | Freq: Once | ORAL | Status: DC
  Filled 2019-11-30: qty 2

## 2019-11-30 MED ORDER — SODIUM CHLORIDE 0.9 % IV BOLUS
500.0000 mL | Freq: Once | INTRAVENOUS | Status: AC
  Administered 2019-11-30: 18:00:00 500 mL via INTRAVENOUS

## 2019-11-30 MED ORDER — INSULIN ASPART 100 UNIT/ML ~~LOC~~ SOLN: 0.0000 [IU] | Freq: Every day | SUBCUTANEOUS | Status: DC

## 2019-11-30 MED ORDER — SODIUM CHLORIDE 0.9 % IV SOLN
1.0000 g | Freq: Once | INTRAVENOUS | Status: AC
  Administered 2019-11-30: 1 g via INTRAVENOUS
  Filled 2019-11-30: qty 10

## 2019-11-30 MED ORDER — ACETAMINOPHEN 160 MG/5ML PO SOLN
650.0000 mg | Freq: Once | ORAL | Status: AC
  Administered 2019-11-30: 650 mg via ORAL
  Filled 2019-11-30: qty 20.3

## 2019-11-30 NOTE — H&P (Addendum)
Date: 11/30/2019               Patient Name:  [name] MRN: [id number]  DOB: [birth year] Age / Sex: 74 y.o., female   PCP: Axel Filler, MD         Medical Service: Internal Medicine Teaching Service         Attending Physician: Dr. Bartholomew Crews, MD    First Contact: Dr. Al Decant Pager: [phone]  Second Contact: Dr. Nathanial Rancher Pager: [phone]       After Hours (After 5p/  First Contact Pager: [phone]  weekends / holidays): Second Contact Pager: [phone]   Chief Complaint: Cough  History of Present Illness:  Mrs.Nourse is a 74 yo F w/ PMH of CKD3a, T2DM, HTN presenting with cough, diarrhea, weakness. She states that her symptoms started about a month right after coming home from visiting family for Christmas. She began to experience malaise, myalgias, non-productive cough, subjective fevers and loss of appetite. No one else from the family endorsed any symptoms. About 2 weeks ago her symptoms began to worsen with headache, chest pain, intermittent shortness of breath. She mentions when she tries to swallow something, she feels swelling in her throat and has had poor oral intake. She states it feels like a 'cold that wont go away.' She mentions about 3 days ago she began to have diarrhea but mentions only 1 bowel movement daily and she denies any BRBPR or melena. She was seen in the ED, was found to have a fever but negative COVID and was discharged home for presumed viral illness.  On chart review, she was examined in the ED 3 days prior. She was found to have bilateral opacities on Chest X-ray. Thought to have non-COVID viral illness and was discharged home.  In the ED, she was found to have hypokalemia, AKI, expanding opacities on X-ray and was started on ceftriaxone and azithromycin and IMTS was consulted for admission.  Meds:  Simvastatin 40mg  qhs Pioglitazone 30mg  daily Methocarbamol 500mg  BID Chlorthalidone 25mg  daily   Allergies: Allergies as of  11/30/2019 - Review Complete 11/30/2019  Allergen Reaction Noted  . Metformin     Past Medical History:  Diagnosis Date  . Diabetes mellitus   . GERD (gastroesophageal reflux disease)   . HLD (hyperlipidemia)   . Hypertension    Family History: Family History  Problem Relation Age of Onset  . Stroke Neg Hx   . Heart disease Neg Hx   . Cancer Neg Hx    Social History:  Denies any smoking, alcohol, or drug use. Lives by herself. Works at [employer].  Review of Systems: A complete ROS was negative except as per HPI.  Physical Exam: Blood pressure (!) 111/53, pulse 75, temperature 100.2 F (37.9 C), temperature source Oral, resp. rate 15, last menstrual period 04/10/1996, SpO2 100 %.  Gen: Well-developed, well nourished, NAD HEENT: NCAT head, hearing intact, EOMI, MMM, No erythematous tonsils, or tonsilar exudates Neck: supple, ROM intact, no JVD, no cervical adenopathy CV: RRR, S1, S2 normal, No rubs, no murmurs, no gallops Pulm: Bibasilar crackles, no wheezes Abd: Soft, BS+, Mild tenderness to palpation of lower qudarants Extm: ROM intact, Peripheral pulses intact, No peripheral edema Skin: Dry, Warm, normal turgor, no rashes, lesions, wounds.   EKG: personally reviewed my interpretation is normal sinus, no ST changes, similar to prior EKG.  CXR: personally reviewed my interpretation is bilateral opacities in lower lobes, worsening compared to prior X-ray  on 1/14  Assessment & Plan by Problem: Active Problems:   Community acquired pneumonia  Mrs.Kelch is a 74 yo F w/ PMH of T2DM, HTN, HLD admitted for community acquired pneumonia  Community Acquired Pneumonia Presenting w/ cough, malaise, diarrhea, nausea. Occurred after domestic travel to visit Family for Christmas. Chest X-ray with bilateral opacities in lower lobes. Discernible nodular opacity w/ recommendation for non-emergent CT. No leukocytosis. No oxygen requirement. 101.73F during ED visit 1/14. Started on  azithromycin and ceftriaxone in ED. - C/w ceftriaxone, azithromycin - Monitor O2 - Legionella, Strep urinary  - Need f/u chest imaging after acute illness resolves - possible nodule in left lower lobe  Acute Kidney Injury on CKD3a Baseline BUN 22 Creatinine 1.18. Mentions endorsing poor oral intake. Likely due to dehydration - NS 100cc/hr - Trend renal fx - Avoid nephrotixc meds   Hypokalemia, Hyponatremia Likely due to poor oral intake. Na 132, K 3.0. Received Na bolus and K in ED - Monitor  DM On pioglitazone only at home - SSI - Glucose checks  HTN On chlorthalidone. Current BP [id number] systolic - Holding home bp  Dispo: Admit patient to Observation with expected length of stay less than 2 midnights.  Signed: Jeanmarie Hubert, MD 11/30/2019, 8:44 PM

## 2019-11-30 NOTE — ED Notes (Signed)
RN unavailable at this time to take report. 5C to call back

## 2019-11-30 NOTE — ED Triage Notes (Signed)
Pt bib ems from home with abd pain chills fever cough diarrhea. Seen several days ago for the same. Pt was tested for covid 2 days ago with negative result.  138/74 HR 82 98% RA CBG 140 100.5 temporal

## 2019-11-30 NOTE — ED Provider Notes (Signed)
[facility] EMERGENCY DEPARTMENT Provider Note   CSN: [id number] Arrival date & time: 11/30/19  1637     History No chief complaint on file. cc cough  [name] is a 74 y.o. female.  She is here with complaints of 3 weeks of cough.  Nonproductive.  Causing her chest and back to hurt.  She is now also started with diarrhea once daily for the last 3 days along with fever and chills.  She said she is generally weak and cannot take care of herself.  The history is provided by the patient.  Influenza Presenting symptoms: cough, diarrhea, fatigue, fever, myalgias, nausea and sore throat   Presenting symptoms: no headaches, no shortness of breath and no vomiting   Cough:    Cough characteristics:  Non-productive   Severity:  Moderate   Onset quality:  Gradual   Timing:  Intermittent   Progression:  Unchanged   Chronicity:  New Diarrhea:    Quality:  Watery   Number of occurrences:  3   Severity:  Moderate   Timing:  Unable to specify   Progression:  Unchanged Associated symptoms: chills and decreased physical activity   Associated symptoms: no mental status change and no syncope   Risk factors: being elderly and diabetes        Past Medical History:  Diagnosis Date  . Diabetes mellitus   . GERD (gastroesophageal reflux disease)   . HLD (hyperlipidemia)   . Hypertension     Patient Active Problem List   Diagnosis Date Noted  . Anemia of renal disease 05/26/2019  . Osteopenia 01/28/2018  . Obesity (BMI 30.0-34.9) 01/28/2018  . Routine health maintenance 03/20/2012  . Type 2 diabetes mellitus with other specified complication ([place]) [id number]  . Dyslipidemia 09/06/2007  . Essential hypertension 09/06/2007    History reviewed. No pertinent surgical history.   OB History   No obstetric history on file.     Family History  Problem Relation Age of Onset  . Stroke Neg Hx   . Heart disease Neg Hx   . Cancer Neg Hx     Social History    Tobacco Use  . Smoking status: Never Smoker  . Smokeless tobacco: Never Used  Substance Use Topics  . Alcohol use: No  . Drug use: No    Home Medications Prior to Admission medications   Medication Sig Start Date End Date Taking? Authorizing Provider  benzonatate (TESSALON) 100 MG capsule Take 1 capsule (100 mg total) by mouth every 8 (eight) hours. 11/27/19   Kizzie Fantasia, MD  cetirizine (ZYRTEC ALLERGY) 10 MG tablet Take 1 tablet (10 mg total) by mouth daily. 11/10/19   Marney Setting, NP  chlorthalidone (HYGROTON) 25 MG tablet Take 1 tablet (25 mg total) by mouth daily. 05/26/19   Axel Filler, MD  methocarbamol (ROBAXIN) 500 MG tablet Take 1 tablet (500 mg total) by mouth 2 (two) times daily. 06/18/19   Sharion Balloon, NP  pioglitazone (ACTOS) 30 MG tablet Take 1 tablet (30 mg total) by mouth daily. 05/26/19   Axel Filler, MD  simvastatin (ZOCOR) 40 MG tablet Take 1 tablet (40 mg total) by mouth at bedtime. 05/26/19   Axel Filler, MD    Allergies    Metformin  Review of Systems   Review of Systems  Constitutional: Positive for chills, fatigue and fever.  HENT: Positive for sore throat.   Eyes: Negative for visual disturbance.  Respiratory: Positive for  cough. Negative for shortness of breath.   Cardiovascular: Negative for chest pain.  Gastrointestinal: Positive for diarrhea and nausea. Negative for abdominal pain and vomiting.  Genitourinary: Negative for dysuria.  Musculoskeletal: Positive for myalgias.  Skin: Negative for rash.  Neurological: Negative for headaches.    Physical Exam Updated Vital Signs BP 104/83   Pulse 90   Temp 100.2 F (37.9 C) (Oral)   Resp 18   LMP 04/10/1996   SpO2 96%   Physical Exam Vitals and nursing note reviewed.  Constitutional:      General: She is not in acute distress.    Appearance: She is well-developed.  HENT:     Head: Normocephalic and atraumatic.  Eyes:     Conjunctiva/sclera:  Conjunctivae normal.  Cardiovascular:     Rate and Rhythm: Normal rate and regular rhythm.     Heart sounds: No murmur.  Pulmonary:     Effort: Pulmonary effort is normal. No respiratory distress.     Breath sounds: Normal breath sounds.  Abdominal:     Palpations: Abdomen is soft.     Tenderness: There is no abdominal tenderness.  Musculoskeletal:        General: No deformity. Normal range of motion.     Cervical back: Neck supple.     Right lower leg: No edema.     Left lower leg: No edema.  Skin:    General: Skin is warm and dry.     Capillary Refill: Capillary refill takes less than 2 seconds.  Neurological:     General: No focal deficit present.     Mental Status: She is alert. Mental status is at baseline.     ED Results / Procedures / Treatments   Labs (all labs ordered are listed, but only abnormal results are displayed) Labs Reviewed  COMPREHENSIVE METABOLIC PANEL - Abnormal; Notable for the following components:      Result Value   Sodium 132 (*)    Potassium 3.0 (*)    Chloride 92 (*)    Glucose, Bld 131 (*)    Creatinine, Ser 1.70 (*)    Calcium 8.7 (*)    Albumin 3.3 (*)    Total Bilirubin 1.6 (*)    GFR calc non Af Amer 29 (*)    GFR calc Af Amer 34 (*)    All other components within normal limits  URINALYSIS, ROUTINE W REFLEX MICROSCOPIC - Abnormal; Notable for the following components:   Ketones, ur 5 (*)    All other components within normal limits  BASIC METABOLIC PANEL - Abnormal; Notable for the following components:   Potassium 3.2 (*)    Glucose, Bld 104 (*)    Creatinine, Ser 1.31 (*)    Calcium 8.6 (*)    GFR calc non Af Amer 40 (*)    GFR calc Af Amer 47 (*)    All other components within normal limits  CBC - Abnormal; Notable for the following components:   RBC 3.84 (*)    All other components within normal limits  HEMOGLOBIN A1C - Abnormal; Notable for the following components:   Hgb A1c MFr Bld 6.8 (*)    All other components within  normal limits  GLUCOSE, CAPILLARY - Abnormal; Notable for the following components:   Glucose-Capillary 103 (*)    All other components within normal limits  RESPIRATORY PANEL BY RT PCR (FLU A&B, COVID)  EXPECTORATED SPUTUM ASSESSMENT W REFEX TO RESP CULTURE  LIPASE, BLOOD  CBC  HIV  ANTIBODY (ROUTINE TESTING W REFLEX)  STREP PNEUMONIAE URINARY ANTIGEN  GLUCOSE, CAPILLARY  LEGIONELLA PNEUMOPHILA SEROGP 1 UR AG  POC SARS CORONAVIRUS 2 AG -  ED    EKG EKG Interpretation  Date/Time:  Sunday November 30 2019 17:50:32 EST Ventricular Rate:  82 PR Interval:    QRS Duration: 86 QT Interval:  365 QTC Calculation: 427 R Axis:   72 Text Interpretation: Sinus rhythm no acute st/ts Confirmed by Aletta Edouard [phone]) on 11/30/2019 6:01:45 PM   Radiology DG Chest Port 1 View  Result Date: 11/30/2019 CLINICAL DATA:  Cough, body aches, chest pain EXAM: PORTABLE CHEST 1 VIEW COMPARISON:  Radiograph 11/27/2019 FINDINGS: Increasing patchy bibasilar airspace opacities more focally confluent in the right lower lung. Nodular airspace opacity previously identified in the and left lung periphery may be part of this acute airspace process. No pneumothorax. No effusion. The cardiomediastinal contours are unremarkable. No acute osseous or soft tissue abnormality. IMPRESSION: Increasing patchy bibasilar airspace opacities, more focally confluent in the right lower lung, most compatible with pneumonia in the given clinical setting. A nodular airspace opacity in the periphery of the left lower lung may be part of this acute airspace process. Recommend continued radiography to resolution. If nodular opacity persists, CT imaging could be obtained. Electronically Signed   By: Lovena Le M.D.   On: 11/30/2019 18:58    Procedures Procedures (including critical care time)  Medications Ordered in ED Medications  simvastatin (ZOCOR) tablet 40 mg (40 mg Oral Given 11/30/19 2257)  methocarbamol (ROBAXIN) tablet 500  mg (500 mg Oral Given 12/01/19 0826)  enoxaparin (LOVENOX) injection 40 mg (40 mg Subcutaneous Given 11/30/19 2257)  0.9 %  sodium chloride infusion ( Intravenous Stopped 11/30/19 2214)  cefTRIAXone (ROCEPHIN) 2 g in sodium chloride 0.9 % 100 mL IVPB (has no administration in time range)  azithromycin (ZITHROMAX) 250 mg in dextrose 5 % 125 mL IVPB (has no administration in time range)  insulin aspart (novoLOG) injection 0-15 Units (0 Units Subcutaneous Not Given 12/01/19 0646)  insulin aspart (novoLOG) injection 0-5 Units (0 Units Subcutaneous Not Given 11/30/19 2238)  dextromethorphan-guaiFENesin ([place] DM) 30-600 MG per 12 hr tablet 1 tablet (1 tablet Oral Given 12/01/19 0953)  benzonatate (TESSALON) capsule 200 mg (200 mg Oral Given 12/01/19 0953)  sodium chloride flush (NS) 0.9 % injection 3 mL (3 mLs Intravenous Given 11/30/19 1807)  sodium chloride 0.9 % bolus 500 mL (0 mLs Intravenous Stopped 11/30/19 2056)  potassium chloride 10 mEq in 100 mL IVPB (0 mEq Intravenous Stopped 11/30/19 2214)  acetaminophen (TYLENOL) 160 MG/5ML solution 650 mg (650 mg Oral Given 11/30/19 1841)  cefTRIAXone (ROCEPHIN) 1 g in sodium chloride 0.9 % 100 mL IVPB (0 g Intravenous Stopped 11/30/19 2055)  azithromycin (ZITHROMAX) 500 mg in sodium chloride 0.9 % 250 mL IVPB (0 mg Intravenous Stopped 11/30/19 2159)  potassium chloride SA (KLOR-CON) CR tablet 40 mEq (40 mEq Oral Given 12/01/19 0812)  potassium chloride (KLOR-CON) packet 40 mEq (40 mEq Oral Given 12/01/19 1007)    ED Course  I have reviewed the triage vital signs and the nursing notes.  Pertinent labs & imaging results that were available during my care of the patient were reviewed by me and considered in my medical decision making (see chart for details).  Clinical Course as of Nov 30 1056  Sun Nov 30, 2019  1844 Patient here with worsening cough and fever along with diarrhea generalized weakness.  Differential includes pneumonia, Covid, flu, dehydration    [  MB]  1903 X-ray interpreted by me as probable bilateral lower lobe infiltrates.   [MB]  1926 Potassium low at 3.0.  Creatinine slightly worse at 1.7.  She has been given some IV fluids and have ordered some IV potassium.  Chest x-ray read as developing infiltrates ceftriaxone and Zithromax ordered.  Covid testing negative.  I reached out to the internal medicine teaching team and they will evaluate the patient for admission.  Patient updated with plan.   [MB]    Clinical Course User Index [MB] Hayden Rasmussen, MD   MDM Rules/Calculators/A&P                     [name] was evaluated in Emergency Department on 11/30/2019 for the symptoms described in the history of present illness. She was evaluated in the context of the global COVID-19 pandemic, which necessitated consideration that the patient might be at risk for infection with the SARS-CoV-2 virus that causes COVID-19. Institutional protocols and algorithms that pertain to the evaluation of patients at risk for COVID-19 are in a state of rapid change based on information released by regulatory bodies including the CDC and federal and state organizations. These policies and algorithms were followed during the patient's care in the ED.   Final Clinical Impression(s) / ED Diagnoses Final diagnoses:  Community acquired pneumonia, unspecified laterality  Hypokalemia  Weakness generalized  Diarrhea, unspecified type    Rx / DC Orders ED Discharge Orders    None       Hayden Rasmussen, MD 12/01/19 1059

## 2019-12-01 DIAGNOSIS — J9601 Acute respiratory failure with hypoxia: Secondary | ICD-10-CM | POA: Diagnosis present

## 2019-12-01 DIAGNOSIS — K219 Gastro-esophageal reflux disease without esophagitis: Secondary | ICD-10-CM | POA: Diagnosis present

## 2019-12-01 DIAGNOSIS — Z20822 Contact with and (suspected) exposure to covid-19: Secondary | ICD-10-CM | POA: Diagnosis present

## 2019-12-01 DIAGNOSIS — E1122 Type 2 diabetes mellitus with diabetic chronic kidney disease: Secondary | ICD-10-CM | POA: Diagnosis present

## 2019-12-01 DIAGNOSIS — E871 Hypo-osmolality and hyponatremia: Secondary | ICD-10-CM | POA: Diagnosis present

## 2019-12-01 DIAGNOSIS — E860 Dehydration: Secondary | ICD-10-CM | POA: Diagnosis present

## 2019-12-01 DIAGNOSIS — Z888 Allergy status to other drugs, medicaments and biological substances status: Secondary | ICD-10-CM | POA: Diagnosis not present

## 2019-12-01 DIAGNOSIS — I129 Hypertensive chronic kidney disease with stage 1 through stage 4 chronic kidney disease, or unspecified chronic kidney disease: Secondary | ICD-10-CM | POA: Diagnosis present

## 2019-12-01 DIAGNOSIS — N1831 Chronic kidney disease, stage 3a: Secondary | ICD-10-CM | POA: Diagnosis present

## 2019-12-01 DIAGNOSIS — J189 Pneumonia, unspecified organism: Secondary | ICD-10-CM | POA: Diagnosis present

## 2019-12-01 DIAGNOSIS — N179 Acute kidney failure, unspecified: Secondary | ICD-10-CM | POA: Diagnosis present

## 2019-12-01 DIAGNOSIS — I10 Essential (primary) hypertension: Secondary | ICD-10-CM | POA: Diagnosis not present

## 2019-12-01 DIAGNOSIS — J159 Unspecified bacterial pneumonia: Secondary | ICD-10-CM | POA: Diagnosis present

## 2019-12-01 DIAGNOSIS — E785 Hyperlipidemia, unspecified: Secondary | ICD-10-CM | POA: Diagnosis present

## 2019-12-01 DIAGNOSIS — E861 Hypovolemia: Secondary | ICD-10-CM | POA: Diagnosis present

## 2019-12-01 DIAGNOSIS — E876 Hypokalemia: Secondary | ICD-10-CM | POA: Diagnosis present

## 2019-12-01 LAB — URINALYSIS, ROUTINE W REFLEX MICROSCOPIC
Bilirubin Urine: NEGATIVE
Glucose, UA: NEGATIVE mg/dL
Hgb urine dipstick: NEGATIVE
Ketones, ur: 5 mg/dL — AB
Leukocytes,Ua: NEGATIVE
Nitrite: NEGATIVE
Protein, ur: NEGATIVE mg/dL
Specific Gravity, Urine: 1.012 (ref 1.005–1.030)
pH: 5 (ref 5.0–8.0)

## 2019-12-01 LAB — CBC
HCT: 36.7 % (ref 36.0–46.0)
Hemoglobin: 12.2 g/dL (ref 12.0–15.0)
MCH: 31.8 pg (ref 26.0–34.0)
MCHC: 33.2 g/dL (ref 30.0–36.0)
MCV: 95.6 fL (ref 80.0–100.0)
Platelets: 200 10*3/uL (ref 150–400)
RBC: 3.84 MIL/uL — ABNORMAL LOW (ref 3.87–5.11)
RDW: 13.7 % (ref 11.5–15.5)
WBC: 4 10*3/uL (ref 4.0–10.5)
nRBC: 0 % (ref 0.0–0.2)

## 2019-12-01 LAB — BASIC METABOLIC PANEL
Anion gap: 11 (ref 5–15)
BUN: 19 mg/dL (ref 8–23)
CO2: 25 mmol/L (ref 22–32)
Calcium: 8.6 mg/dL — ABNORMAL LOW (ref 8.9–10.3)
Chloride: 100 mmol/L (ref 98–111)
Creatinine, Ser: 1.31 mg/dL — ABNORMAL HIGH (ref 0.44–1.00)
GFR calc Af Amer: 47 mL/min — ABNORMAL LOW (ref >60)
GFR calc non Af Amer: 40 mL/min — ABNORMAL LOW (ref >60)
Glucose, Bld: 104 mg/dL — ABNORMAL HIGH (ref 70–99)
Potassium: 3.2 mmol/L — ABNORMAL LOW (ref 3.5–5.1)
Sodium: 136 mmol/L (ref 135–145)

## 2019-12-01 LAB — GLUCOSE, CAPILLARY
Glucose-Capillary: 93 mg/dL (ref 70–99)
Glucose-Capillary: 116 mg/dL — ABNORMAL HIGH (ref 70–99)
Glucose-Capillary: 104 mg/dL — ABNORMAL HIGH (ref 70–99)
Glucose-Capillary: 154 mg/dL — ABNORMAL HIGH (ref 70–99)

## 2019-12-01 LAB — HEMOGLOBIN A1C
Hgb A1c MFr Bld: 6.8 % — ABNORMAL HIGH (ref 4.8–5.6)
Mean Plasma Glucose: 148.46 mg/dL

## 2019-12-01 LAB — HIV ANTIBODY (ROUTINE TESTING W REFLEX): HIV Screen 4th Generation wRfx: NONREACTIVE

## 2019-12-01 LAB — STREP PNEUMONIAE URINARY ANTIGEN: Strep Pneumo Urinary Antigen: NEGATIVE

## 2019-12-01 MED ORDER — DM-GUAIFENESIN ER 30-600 MG PO TB12
1.0000 | ORAL_TABLET | Freq: Two times a day (BID) | ORAL | Status: DC
  Administered 2019-12-01 – 2019-12-02 (×3): 1 via ORAL
  Filled 2019-12-01 (×4): qty 1

## 2019-12-01 MED ORDER — ONDANSETRON HCL 4 MG/2ML IJ SOLN
4.0000 mg | Freq: Three times a day (TID) | INTRAMUSCULAR | Status: DC | PRN
  Administered 2019-12-01: 18:00:00 4 mg via INTRAVENOUS
  Filled 2019-12-01: qty 2

## 2019-12-01 MED ORDER — GUAIFENESIN-DM 100-10 MG/5ML PO SYRP
5.0000 mL | ORAL_SOLUTION | ORAL | Status: DC | PRN
  Filled 2019-12-01: qty 5

## 2019-12-01 MED ORDER — ONDANSETRON 4 MG PO TBDP: 4.0000 mg | ORAL_TABLET | Freq: Three times a day (TID) | ORAL | Status: DC | PRN

## 2019-12-01 MED ORDER — POTASSIUM CHLORIDE CRYS ER 20 MEQ PO TBCR
40.0000 meq | EXTENDED_RELEASE_TABLET | Freq: Once | ORAL | Status: AC
  Administered 2019-12-01: 40 meq via ORAL
  Filled 2019-12-01: qty 2

## 2019-12-01 MED ORDER — POTASSIUM CHLORIDE 20 MEQ PO PACK
40.0000 meq | PACK | Freq: Once | ORAL | Status: AC
  Administered 2019-12-01: 10:00:00 40 meq via ORAL
  Filled 2019-12-01: qty 2

## 2019-12-01 MED ORDER — BENZONATATE 100 MG PO CAPS
200.0000 mg | ORAL_CAPSULE | Freq: Three times a day (TID) | ORAL | Status: DC | PRN
  Administered 2019-12-01: 10:00:00 200 mg via ORAL
  Filled 2019-12-01: qty 2

## 2019-12-01 NOTE — Progress Notes (Signed)
   Subjective:   [name] feels mildly improved compared to when she came in. Still feels quite weak. She was able to eat some this morning though. Her breathing feels better but endorses a cough that keeps her up at night with occasional productive sputum. Endorses diarrhea, no n/v.   She is concerned because she is not currently able to do anything for herself at home.   Objective:  Vital signs in last 24 hours: Vitals:   11/30/19 2204 11/30/19 2239 12/01/19 0200 12/01/19 0442  BP:  123/60  140/65  Pulse:  66  78  Resp:      Temp: 98.6 F (37 C) 98.5 F (36.9 C)  98.6 F (37 C)  TempSrc: Oral Oral  Oral  SpO2:  97%  98%  Weight:   75.4 kg     Physical Exam Vitals and nursing note reviewed.  Constitutional:      General: She is not in acute distress.    Appearance: She is normal weight.  Cardiovascular:     Rate and Rhythm: Normal rate and regular rhythm.     Heart sounds: Normal heart sounds. No murmur.  Pulmonary:     Effort: Pulmonary effort is normal. No respiratory distress.     Breath sounds: Normal breath sounds. No wheezing, rhonchi or rales.  Abdominal:     General: Bowel sounds are normal.     Palpations: Abdomen is soft.  Skin:    General: Skin is warm and dry.  Neurological:     Mental Status: She is alert and oriented to person, place, and time. Mental status is at baseline.  Psychiatric:        Mood and Affect: Mood normal.        Behavior: Behavior normal.    Assessment/Plan:  Active Problems:   Community acquired pneumonia  [name] is a 74 yo F w/ PMH of T2DM, HTN, HLD admitted for community acquired pneumonia.  # Community Acquired Pneumonia CXR on admission showed increasing patchy bibasilar opacities. She has remained afebrile this admission. Unfortunately, she has a new oxygen requirement this AM. Initially on 4L when we evaluate, attempted to wean and oxygen saturation dropped to 88%. Required increase to 3L to maintain saturation over  92%. Hemodynamically stable otherwise.   - O2 supplementation PRN to maintain saturation >92% - wean oxygen as tolerated - continue Ceftriaxone and Azithromycin for CAP coverage - Mucinex, tessalon perles for cough  - Continuous pulse ox   # Acute Kidney Injury  # History of CKD Stage 3a Resolving this AM with creatinine improved to 1.30 from 1.70 with IV fluids. Likely secondary to hypovolemia in light of poor PO intake recently.   - IVF: [place] @ 100 cc/hr, stop at 24 hour mark - BMP in the AM  # Hypokalemia  Improving this AM, however she has been unable to tolerate PO potassium supplementation. Will try powder form and if unsuccessful, transition to IV.   - BMP in the AM - Avoid nephrotoxic agents   # T2DM - SSI  # HTN  Holding chlorthalidone due to AKI. BP has been stable.    Dispo: Anticipated discharge pending clinical improvement.   Dr. Jose Persia Internal Medicine PGY-1  Pager: [phone] 12/01/2019, 8:23 AM

## 2019-12-01 NOTE — Evaluation (Signed)
Physical Therapy Evaluation Patient Details Name: [name] MRN: [id number] DOB: 1946/02/23 Today's Date: 12/01/2019   History of Present Illness  Pt is a 74 y/o female admitted secondary to SOB. Found to have CAP and hypokalemia. PMH includes DM, HTN, and GERD.   Clinical Impression  Pt admitted secondary to problem above with deficits below. Pt presenting with generalized weakness and unsteadiness. Required min guard to min A for mobility using RW. Pt reports she lives alone, and has been having increased difficulty caring for herself. Currently refusing SNF, however. Recommending HHPT, [name], and [name]. Will continue to follow acutely to maximize functional mobility independence and safety.     Follow Up Recommendations Home health PT;Supervision for mobility/OOB(HHOT, [name])    Equipment Recommendations  Rolling walker with 5" wheels;3in1 (PT)    Recommendations for Other Services       Precautions / Restrictions Precautions Precautions: Fall Restrictions Weight Bearing Restrictions: No      Mobility  Bed Mobility Overal bed mobility: Needs Assistance Bed Mobility: Supine to Sit;Sit to Supine     Supine to sit: Supervision Sit to supine: Supervision   General bed mobility comments: Supervision for safety.   Transfers Overall transfer level: Needs assistance Equipment used: None Transfers: Sit to/from Stand Sit to Stand: Min assist         General transfer comment: Shakiness noted upon standing and required min A for steadying assist. Gave RW to increase steadiness.   Ambulation/Gait Ambulation/Gait assistance: Min guard;Min assist Gait Distance (Feet): 120 Feet Assistive device: Rolling walker (2 wheeled) Gait Pattern/deviations: Step-through pattern;Decreased stride length Gait velocity: Decreased   General Gait Details: Min guard to min A for steadying assist using RW. Cues for sequencing using RW. Pt reports feeling weaker than normal.   Stairs            Wheelchair Mobility    Modified Rankin (Stroke Patients Only)       Balance Overall balance assessment: Needs assistance Sitting-balance support: Feet supported;No upper extremity supported Sitting balance-Leahy Scale: Good     Standing balance support: Bilateral upper extremity supported;During functional activity;No upper extremity supported Standing balance-Leahy Scale: Poor Standing balance comment: Reliant on UE support.                              Pertinent Vitals/Pain Pain Assessment: No/denies pain    Home Living Family/patient expects to be discharged to:: Private residence Living Arrangements: Alone Available Help at Discharge: Family;Available PRN/intermittently Type of Home: House Home Access: Level entry     Home Layout: Two level;Bed/bath upstairs Home Equipment: None Additional Comments: Reports son occasionally checks on her.     Prior Function Level of Independence: Independent         Comments: Reports she "tries to be independent" however, has had increased difficulty caring for herself.      Hand Dominance        Extremity/Trunk Assessment   Upper Extremity Assessment Upper Extremity Assessment: Defer to OT evaluation    Lower Extremity Assessment Lower Extremity Assessment: Generalized weakness    Cervical / Trunk Assessment Cervical / Trunk Assessment: Normal  Communication   Communication: No difficulties  Cognition Arousal/Alertness: Awake/alert Behavior During Therapy: WFL for tasks assessed/performed Overall Cognitive Status: Within Functional Limits for tasks assessed  General Comments General comments (skin integrity, edema, etc.): Oxygen sats at 99% on 4L following ambulation. Pt voiced concerns about difficulty caring for herself, however, does not want to go to SNF.     Exercises     Assessment/Plan    PT Assessment Patient needs  continued PT services  PT Problem List Decreased strength;Decreased balance;Decreased mobility;Decreased knowledge of use of DME       PT Treatment Interventions DME instruction;Functional mobility training;Stair training;Gait training;Therapeutic activities;Therapeutic exercise;Balance training;Patient/family education    PT Goals (Current goals can be found in the Care Plan section)  Acute Rehab PT Goals Patient Stated Goal: to get stronger and be able to care for herself PT Goal Formulation: With patient Time For Goal Achievement: 12/15/19 Potential to Achieve Goals: Good    Frequency Min 3X/week   Barriers to discharge Decreased caregiver support      Co-evaluation               AM-PAC PT "6 Clicks" Mobility  Outcome Measure Help needed turning from your back to your side while in a flat bed without using bedrails?: None Help needed moving from lying on your back to sitting on the side of a flat bed without using bedrails?: None Help needed moving to and from a bed to a chair (including a wheelchair)?: A Little Help needed standing up from a chair using your arms (e.g., wheelchair or bedside chair)?: A Little Help needed to walk in hospital room?: A Little Help needed climbing 3-5 steps with a railing? : A Lot 6 Click Score: 19    End of Session Equipment Utilized During Treatment: Gait belt Activity Tolerance: Patient tolerated treatment well Patient left: in bed;with call bell/phone within reach Nurse Communication: Mobility status PT Visit Diagnosis: Muscle weakness (generalized) (M62.81);Unsteadiness on feet (R26.81)    Time: [id number] PT Time Calculation (min) (ACUTE ONLY): 20 min   Charges:   PT Evaluation $PT Eval Low Complexity: Hagaman, PT, DPT  Acute Rehabilitation Services  Pager: [phone] Office: [phone]   Rudean Hitt 12/01/2019, 4:53 PM

## 2019-12-02 ENCOUNTER — Encounter (HOSPITAL_COMMUNITY): Payer: Self-pay | Admitting: Internal Medicine

## 2019-12-02 DIAGNOSIS — Z79899 Other long term (current) drug therapy: Secondary | ICD-10-CM

## 2019-12-02 DIAGNOSIS — E785 Hyperlipidemia, unspecified: Secondary | ICD-10-CM

## 2019-12-02 DIAGNOSIS — N179 Acute kidney failure, unspecified: Secondary | ICD-10-CM

## 2019-12-02 DIAGNOSIS — E119 Type 2 diabetes mellitus without complications: Secondary | ICD-10-CM

## 2019-12-02 DIAGNOSIS — E876 Hypokalemia: Secondary | ICD-10-CM

## 2019-12-02 DIAGNOSIS — I10 Essential (primary) hypertension: Secondary | ICD-10-CM

## 2019-12-02 DIAGNOSIS — J189 Pneumonia, unspecified organism: Secondary | ICD-10-CM

## 2019-12-02 DIAGNOSIS — Z888 Allergy status to other drugs, medicaments and biological substances status: Secondary | ICD-10-CM

## 2019-12-02 LAB — GLUCOSE, CAPILLARY
Glucose-Capillary: 112 mg/dL — ABNORMAL HIGH (ref 70–99)
Glucose-Capillary: 104 mg/dL — ABNORMAL HIGH (ref 70–99)
Glucose-Capillary: 170 mg/dL — ABNORMAL HIGH (ref 70–99)

## 2019-12-02 LAB — CBC WITH DIFFERENTIAL/PLATELET
Abs Immature Granulocytes: 0.08 10*3/uL — ABNORMAL HIGH (ref 0.00–0.07)
Basophils Absolute: 0 10*3/uL (ref 0.0–0.1)
Basophils Relative: 0 %
Eosinophils Absolute: 0 10*3/uL (ref 0.0–0.5)
Eosinophils Relative: 1 %
HCT: 34.9 % — ABNORMAL LOW (ref 36.0–46.0)
Hemoglobin: 11.4 g/dL — ABNORMAL LOW (ref 12.0–15.0)
Immature Granulocytes: 2 %
Lymphocytes Relative: 26 %
Lymphs Abs: 1 10*3/uL (ref 0.7–4.0)
MCH: 31.3 pg (ref 26.0–34.0)
MCHC: 32.7 g/dL (ref 30.0–36.0)
MCV: 95.9 fL (ref 80.0–100.0)
Monocytes Absolute: 0.3 10*3/uL (ref 0.1–1.0)
Monocytes Relative: 8 %
Neutro Abs: 2.4 10*3/uL (ref 1.7–7.7)
Neutrophils Relative %: 63 %
Platelets: 213 10*3/uL (ref 150–400)
RBC: 3.64 MIL/uL — ABNORMAL LOW (ref 3.87–5.11)
RDW: 13.5 % (ref 11.5–15.5)
WBC: 3.9 10*3/uL — ABNORMAL LOW (ref 4.0–10.5)
nRBC: 0 % (ref 0.0–0.2)

## 2019-12-02 LAB — BASIC METABOLIC PANEL
Anion gap: 10 (ref 5–15)
BUN: 9 mg/dL (ref 8–23)
CO2: 28 mmol/L (ref 22–32)
Calcium: 8.5 mg/dL — ABNORMAL LOW (ref 8.9–10.3)
Chloride: 101 mmol/L (ref 98–111)
Creatinine, Ser: 1 mg/dL (ref 0.44–1.00)
GFR calc Af Amer: >60 mL/min (ref >60)
GFR calc non Af Amer: 56 mL/min — ABNORMAL LOW (ref >60)
Glucose, Bld: 145 mg/dL — ABNORMAL HIGH (ref 70–99)
Potassium: 3.3 mmol/L — ABNORMAL LOW (ref 3.5–5.1)
Sodium: 139 mmol/L (ref 135–145)

## 2019-12-02 LAB — CULTURE, BLOOD (ROUTINE X 2)
Culture: NO GROWTH
Culture: NO GROWTH

## 2019-12-02 MED ORDER — AZITHROMYCIN 250 MG PO TABS: 250.0000 mg | ORAL_TABLET | Freq: Every day | ORAL | 0 refills | Status: AC

## 2019-12-02 MED ORDER — BENZONATATE 200 MG PO CAPS: 200.0000 mg | ORAL_CAPSULE | Freq: Three times a day (TID) | ORAL | 0 refills | Status: DC | PRN

## 2019-12-02 MED ORDER — AMOXICILLIN-POT CLAVULANATE 875-125 MG PO TABS: 1.0000 | ORAL_TABLET | Freq: Two times a day (BID) | ORAL | 0 refills | Status: AC

## 2019-12-02 NOTE — Progress Notes (Signed)
   Subjective:   Ms. Verdell is feeling improved from a breathing standpoint. She endorses a continue cough but notes it is improving as well. In addition, she continues to feel weak but is feeling better now that she has eaten. Patient also c/o 2 loose stools yesterday and 1 today that are not completely diarrheal. Nausea has resolved.   Discussed plan for discharge home with [facility] if ambulating O2 saturation is able to be maintained. She agreed with this plan. All questions and concerns addressed.   Objective:  Vital signs in last 24 hours: Vitals:   12/01/19 1238 12/01/19 1743 12/01/19 2250 12/02/19 0520  BP: 94/73 (!) 112/55 (!) 120/59 (!) 117/49  Pulse: 74 67 67 64  Resp: 16 18 18 15   Temp: 98.5 F (36.9 C) 98.4 F (36.9 C) 98.6 F (37 C) 98.5 F (36.9 C)  TempSrc: Oral Oral Oral Oral  SpO2: 100% 99% 98% 97%  Weight:       Physical Exam Vitals and nursing note reviewed.  Constitutional:      General: She is not in acute distress.    Appearance: She is normal weight.  Pulmonary:     Effort: Pulmonary effort is normal. No respiratory distress.     Breath sounds: Normal breath sounds. No wheezing, rhonchi or rales.  Skin:    General: Skin is warm and dry.  Neurological:     Mental Status: She is alert and oriented to person, place, and time.  Psychiatric:        Mood and Affect: Mood normal.        Behavior: Behavior normal.    Assessment/Plan:  Active Problems:   Community acquired pneumonia   CAP (community acquired pneumonia)  Mrs.Culliver is a 74 yo F w/ PMH of T2DM, HTN, HLD admittedfor community acquired pneumonia.  # Community Acquired Pneumonia Mr. Matley was able to be weaned off oxygen overnight. Ambulating oxygen saturation pending to determine need to home oxygen supplementation.   - continue Ceftriaxone and Azithromycin for CAP coverage - Mucinex, tessalon perles for cough   # Acute Kidney Injury: Resolved  # Hypokalemia  Improving this AM. Likely  in the setting of decreased PO intake and mild diarrhea. Plan outpatient follow up with BMP, if still low, may need daily supplementation.   # HTN  Restarting home meds on discharge.    # T2DM  - SSI while in patient.   Dispo: Anticipated discharge today.   Dr. Jose Persia Internal Medicine PGY-1  Pager: [phone] 12/02/2019, 6:46 AM

## 2019-12-02 NOTE — Discharge Summary (Addendum)
Name: [name] MRN: [id number] DOB: Nov 17, 1945 74 y.o. PCP: Axel Filler, MD  Date of Admission: 11/30/2019  4:45 PM Date of Discharge:  12/02/2019 Attending Physician: Axel Filler, *  Discharge Diagnosis: 1. Community acquired Pneumonia 2. Acute Kidney Injury  3. Hypokalemia  Discharge Medications: Allergies as of 12/02/2019       Reactions   Metformin Other (See Comments)   REACTION: Nausea/Diarrhea, feeling very sick, missing work        Medication List     STOP taking these medications    Hydromet 5-1.5 MG/5ML syrup Generic drug: HYDROcodone-homatropine       TAKE these medications    amoxicillin-clavulanate 875-125 MG tablet Commonly known as: Augmentin Take 1 tablet by mouth 2 (two) times daily for 7 days.   aspirin EC 81 MG tablet Take 81 mg by mouth daily.   azithromycin 250 MG tablet Commonly known as: ZITHROMAX Take 1 tablet (250 mg total) by mouth daily for 3 days. What changed:  how much to take when to take this additional instructions   benzonatate 200 MG capsule Commonly known as: TESSALON Take 1 capsule (200 mg total) by mouth 3 (three) times daily as needed for cough. What changed:  medication strength how much to take when to take this reasons to take this   cetirizine 10 MG tablet Commonly known as: ZyrTEC Allergy Take 1 tablet (10 mg total) by mouth daily.   chlorthalidone 25 MG tablet Commonly known as: HYGROTON Take 1 tablet (25 mg total) by mouth daily.   methocarbamol 500 MG tablet Commonly known as: ROBAXIN Take 1 tablet (500 mg total) by mouth 2 (two) times daily.   pioglitazone 30 MG tablet Commonly known as: ACTOS Take 1 tablet (30 mg total) by mouth daily.   simvastatin 40 MG tablet Commonly known as: ZOCOR Take 1 tablet (40 mg total) by mouth at bedtime.               Durable Medical Equipment  (From admission, onward)           Start     Ordered   12/02/19 1422  For  home use only DME Walker rolling  Once    Question Answer Comment  Walker: With [place] Wheels   Patient needs a walker to treat with the following condition Physical deconditioning      12/02/19 1423   12/02/19 1421  For home use only DME 3 n 1  Once     12/02/19 1423            Disposition and follow-up:   Ms.Alysia H Espino was discharged from [facility] in Good condition.  At the hospital follow up visit please address:  1.   Patient will need non-emergent CT of nodularity noticed on CXR once acute illness resolves  2.  Labs / imaging needed at time of follow-up: BMP  3.  Pending labs/ test needing follow-up: None   Follow-up Appointments: Follow-up Information     Axel Filler, MD. Schedule an appointment as soon as possible for a visit in 1 week(s).   Specialty: Internal Medicine Contact information: [place] [address] [place] Hospital Course by problem list: 1. Community acquired Pneumonia:  Ms. Basgall presented to the ED with complaints of cough, shortness of breath and chest pain that had worsened after having malaise, myalgias, subjective  fevers, diarrhea, weakness for the past 2 weeks.  Initially presumed to be viral, patient failed outpatient conservative management.  On admission, chest x-ray showed bibasilar patchy opacities.  She was started on ceftriaxone and azithromycin, in addition to requiring supplemental oxygen.  After 24 hours she was able to be weaned off supplemental oxygen and ambulated without any desaturations.  No leukocytosis or febrile episodes throughout this hospitalization.  Discharged on Augmentin and Azithromycin.  2. Acute Kidney Injury  Secondary to hypovolemia in light of decreased p.o. intake over the past few days.  Creatinine improved with IV fluid resuscitation.  3. Hypokalemia Likely secondary to decreased p.o. intake.  Unfortunately patient was unable to  tolerate oral potassium supplementation due to texture and nausea, so replenished via IV.  On day of discharge, potassium was noted to be mildly decreased at 3.3.  Please follow-up outpatient BMP and replenish potassium as needed.  Discharge Vitals:   BP 128/66   Pulse 65   Temp 97.9 F (36.6 C) (Oral)   Resp 18   Ht 5\' 5"  (1.651 m)   Wt 75.3 kg   LMP 04/10/1996   SpO2 95%   BMI 27.62 kg/m   Pertinent Labs, Studies, and Procedures:   BMP Latest Ref Rng & Units 12/02/2019 12/01/2019 11/30/2019  Glucose 70 - 99 mg/dL 145(H) 104(H) 131(H)  BUN 8 - 23 mg/dL 9 19 21   Creatinine 0.44 - 1.00 mg/dL 1.00 1.31(H) 1.70(H)  BUN/Creat Ratio 12 - 28 - - -  Sodium 135 - 145 mmol/L 139 136 132(L)  Potassium 3.5 - 5.1 mmol/L 3.3(L) 3.2(L) 3.0(L)  Chloride 98 - 111 mmol/L 101 100 92(L)  CO2 22 - 32 mmol/L 28 25 26   Calcium 8.9 - 10.3 mg/dL 8.5(L) 8.6(L) 8.7(L)   CBC Latest Ref Rng & Units 12/02/2019 12/01/2019 11/30/2019  WBC 4.0 - 10.5 K/uL 3.9(L) 4.0 5.3  Hemoglobin 12.0 - 15.0 g/dL 11.4(L) 12.2 12.4  Hematocrit 36.0 - 46.0 % 34.9(L) 36.7 38.2  Platelets 150 - 400 K/uL 213 200 200   CXR: 11/30/2019 IMPRESSION: Increasing patchy bibasilar airspace opacities, more focally confluent in the right lower lung, most compatible with pneumonia in the given clinical setting.   A nodular airspace opacity in the periphery of the left lower lung may be part of this acute airspace process. Recommend continued radiography to resolution. If nodular opacity persists, CT imaging could be obtained.  Discharge Instructions: Discharge Instructions     Call MD for:  difficulty breathing, headache or visual disturbances   Complete by: As directed    Call MD for:  extreme fatigue   Complete by: As directed    Call MD for:  persistant dizziness or light-headedness   Complete by: As directed    Call MD for:  persistant nausea and vomiting   Complete by: As directed    Call MD for:  temperature >100.4    Complete by: As directed    Diet - low sodium heart healthy   Complete by: As directed    Discharge instructions   Complete by: As directed    Thank you for allowing [place] to care for you during your hospitalization.  You were admitted for community-acquired pneumonia. Treatments included antibiotics and oxygen supplementation. You are being discharged with an additional 3 days of antibiotics to complete a 5 day course.   Please follow up with your primary care doctor in the next week.   If you have any questions or concerns, please call the [facility]  Internal Medicine clinic at [phone].   Increase activity slowly   Complete by: As directed        Signed: Dr. Jose Persia Internal Medicine PGY-1  Pager: [phone] 12/02/2019, 3:05 PM

## 2019-12-02 NOTE — Plan of Care (Signed)

## 2019-12-02 NOTE — Progress Notes (Deleted)
SATURATION QUALIFICATIONS: (This note is used to comply with regulatory documentation for home oxygen)  Patient Saturations on Room Air at Rest = 98%  Patient Saturations on Room Air while Ambulating = 88%  Patient Saturations on 2 Liters of oxygen while Ambulating = 97%  Please briefly explain why patient needs home oxygen: Patient cannot maintain safe and normal O2 saturation when ambulating on RA. Her covid test was negative for current infection, but positive for antibodies (indicating previous infection). Patient may still be recovering from prior infection.

## 2019-12-02 NOTE — Progress Notes (Signed)
12/02/19 1541  PT Visit Information  Last PT Received On 12/02/19  Assistance Needed +1  History of Present Illness Pt is a 74 y/o female admitted secondary to SOB. Found to have CAP and hypokalemia. PMH includes DM, HTN, and GERD.   Subjective Data  Patient Stated Goal to get stronger and be able to care for herself  Precautions  Precautions Fall  Restrictions  Weight Bearing Restrictions No  Pain Assessment  Pain Assessment No/denies pain  Cognition  Arousal/Alertness Awake/alert  Behavior During Therapy [facility] for tasks assessed/performed  Overall Cognitive Status Within Functional Limits for tasks assessed  Bed Mobility  General bed mobility comments In recliner upon entry   Transfers  Overall transfer level Needs assistance  Equipment used None  Transfers Sit to/from Stand  Sit to Stand Min guard  General transfer comment Min guard for safety. Improved stability noted this session.   Ambulation/Gait  Ambulation/Gait assistance Min guard;Supervision  Gait Distance (Feet) 125 Feet  Assistive device None  Gait Pattern/deviations Step-through pattern;Decreased stride length  General Gait Details Slow, mildly unsteady gait. Pt reaching for hand rail for stability. Educated about use of RW at home to improve safety as she lives alone. Required frequent rests secondary to fatigue.   Gait velocity Decreased  Balance  Overall balance assessment Needs assistance  Sitting-balance support Feet supported;No upper extremity supported  Sitting balance-Leahy Scale Good  Standing balance support Bilateral upper extremity supported;During functional activity;No upper extremity supported  Standing balance-Leahy Scale Fair  Standing balance comment Able to maintain static standing without UE support   Exercises  Exercises General Lower Extremity  General Exercises - Lower Extremity  Ankle Circles/Pumps AROM;Both;20 reps  Long Arc Quad AROM;Both;10 reps;Seated  Hip Flexion/Marching  AROM;Both;10 reps;Seated  PT - End of Session  Equipment Utilized During Treatment Gait belt  Activity Tolerance Patient tolerated treatment well  Patient left in chair;with call bell/phone within reach  Nurse Communication Mobility status   PT - Assessment/Plan  PT Plan Current plan remains appropriate  PT Visit Diagnosis Muscle weakness (generalized) (M62.81);Unsteadiness on feet (R26.81)  PT Frequency (ACUTE ONLY) Min 3X/week  Follow Up Recommendations Home health PT;Supervision for mobility/OOB ([name])  PT equipment Rolling walker with 5" wheels;3in1 (PT)  AM-PAC PT "6 Clicks" Mobility Outcome Measure (Version 2)  Help needed turning from your back to your side while in a flat bed without using bedrails? 4  Help needed moving from lying on your back to sitting on the side of a flat bed without using bedrails? 4  Help needed moving to and from a bed to a chair (including a wheelchair)? 3  Help needed standing up from a chair using your arms (e.g., wheelchair or bedside chair)? 3  Help needed to walk in hospital room? 3  Help needed climbing 3-5 steps with a railing?  2  6 Click Score 19  Consider Recommendation of Discharge To: Home with [facility]  PT Goal Progression  Progress towards PT goals Progressing toward goals  Acute Rehab PT Goals  PT Goal Formulation With patient  Time For Goal Achievement 12/15/19  Potential to Achieve Goals Good  PT Time Calculation  PT Start Time (ACUTE ONLY) 1505  PT Stop Time (ACUTE ONLY) 1520  PT Time Calculation (min) (ACUTE ONLY) 15 min  PT General Charges  $$ ACUTE PT VISIT 1 Visit  PT Treatments  $Gait Training 8-22 mins   Pt progressing well towards goals. Practiced gait without AD, however, noted some unsteadiness and pt  reaching for rail in hall. Educated about using RW to improve safety since pt lives alone. Required min guard to supervision assist with mobility tasks. Educated about seated HEP. Current recommendations appropriate. Will  continue to follow acutely to maximize functional mobility independence and safety.   Leighton Ruff, PT, DPT  Acute Rehabilitation Services  Pager: [phone] Office: [phone]

## 2019-12-02 NOTE — Evaluation (Addendum)
Occupational Therapy Evaluation Patient Details Name: [name] MRN: [id number] DOB: [birth year] Today's Date: 12/02/2019    History of Present Illness Pt is a 74 y/o female admitted secondary to SOB. Found to have CAP and hypokalemia. PMH includes DM, HTN, and GERD.    Clinical Impression   Pt PTA: Pt living at home alone and reports not being able to properly care for self at home due to diarrhea. Pt has a son that can come occasionally, but her youngest daughter was recently diagnosed with cancer and will undergo chemo soon. Pt currently limited by decreased activity tolerance and need for frequent breaks. Pt currently supervisionA for standing at sink for bathing/dressing/grooming ADL routine. No physical assist required. Pt sat down to donn/doff new socks and required a 5 mins rest break prior to standing again. Pt using no AD at this time. Pt given energy conservation techniques to include when d/c home. Handout provided. Pt encouraged to continue to perform sink baths at home to conserve energy when HHA not available. Pt does not require acute OT skills, but would benefit from [place]. OT signing off.  ** Pt continues to deny need for SNF and this OTR saw that pt is supervision level for ADL with no real physical assist required. HH therapies should suffice and pt encouraged to get assist for frozen meals to make meal prep easier.    Follow Up Recommendations  Home health OT(with home health aid)    Equipment Recommendations  3 in 1 bedside commode    Recommendations for Other Services       Precautions / Restrictions Precautions Precautions: Fall Restrictions Weight Bearing Restrictions: No      Mobility Bed Mobility               General bed mobility comments: standing in room performing ADL  Transfers Overall transfer level: Needs assistance Equipment used: None                  Balance Overall balance assessment: Needs assistance Sitting-balance  support: Feet supported;No upper extremity supported Sitting balance-Leahy Scale: Good     Standing balance support: Bilateral upper extremity supported;During functional activity;No upper extremity supported Standing balance-Leahy Scale: Poor Standing balance comment: Reliant on UE support; has ability to stand at sink without holding on for a few seconds                           ADL either performed or assessed with clinical judgement   ADL Overall ADL's : Needs assistance/impaired Eating/Feeding: Set up;Sitting   Grooming: Supervision/safety;Sitting;Standing Grooming Details (indicate cue type and reason): Pt mostly standing for task Upper Body Bathing: Supervision/ safety;Sitting;Standing Upper Body Bathing Details (indicate cue type and reason): stoood entire time at sink with no physical assist Lower Body Bathing: Supervison/ safety;Sitting/lateral leans;Sit to/from stand;Cueing for safety Lower Body Bathing Details (indicate cue type and reason): sat down to apply socks; stood for pericare Upper Body Dressing : Supervision/safety;Sitting   Lower Body Dressing: Set up;Sitting/lateral leans;Sit to/from stand;Cueing for safety   Toilet Transfer: Min guard;Ambulation   Toileting- Clothing Manipulation and Hygiene: Min guard;Sitting/lateral lean;Sit to/from stand;Cueing for safety       Functional mobility during ADLs: Supervision/safety;Min guard General ADL Comments: Pt limited by decreased activity tolerance, decreased ability to care for self and decreased mobility.     Vision Baseline Vision/History: Wears glasses Wears Glasses: At all times Patient Visual Report: No change from baseline Vision  Assessment?: No apparent visual deficits     Perception     Praxis      Pertinent Vitals/Pain       Hand Dominance Right   Extremity/Trunk Assessment Upper Extremity Assessment Upper Extremity Assessment: Generalized weakness   Lower Extremity  Assessment Lower Extremity Assessment: Generalized weakness   Cervical / Trunk Assessment Cervical / Trunk Assessment: Normal   Communication Communication Communication: No difficulties   Cognition Arousal/Alertness: Awake/alert Behavior During Therapy: WFL for tasks assessed/performed Overall Cognitive Status: Within Functional Limits for tasks assessed                                     General Comments  O2 sats >90% on RA.    Exercises     Shoulder Instructions      Home Living Family/patient expects to be discharged to:: Private residence Living Arrangements: Alone Available Help at Discharge: Family;Available PRN/intermittently Type of Home: House Home Access: Level entry     Home Layout: Two level;Bed/bath upstairs Alternate Level Stairs-Number of Steps: flight Alternate Level Stairs-Rails: Left Bathroom Shower/Tub: Tub/shower unit   Bathroom Toilet: Standard     Home Equipment: None   Additional Comments: Reports son occasionally checks on her.       Prior Functioning/Environment Level of Independence: Independent        Comments: Reports she "tries to be independent" getting own groceries and ADL tasks; however, has had increased difficulty caring for herself.         OT Problem List: Decreased activity tolerance;Impaired balance (sitting and/or standing)      OT Treatment/Interventions:      OT Goals(Current goals can be found in the care plan section) Acute Rehab OT Goals Patient Stated Goal: to get stronger and be able to care for herself OT Goal Formulation: With patient  OT Frequency:     Barriers to D/C:            Co-evaluation              AM-PAC OT "6 Clicks" Daily Activity     Outcome Measure Help from another person eating meals?: None Help from another person taking care of personal grooming?: A Little Help from another person toileting, which includes using toliet, bedpan, or urinal?: A Little Help  from another person bathing (including washing, rinsing, drying)?: A Little Help from another person to put on and taking off regular upper body clothing?: None Help from another person to put on and taking off regular lower body clothing?: None 6 Click Score: 21   End of Session Nurse Communication: Mobility status  Activity Tolerance: Patient limited by fatigue;Patient tolerated treatment well Patient left: in bed;with call bell/phone within reach  OT Visit Diagnosis: Unsteadiness on feet (R26.81);Muscle weakness (generalized) (M62.81)                Time: [id number] OT Time Calculation (min): 42 min Charges:  OT General Charges $OT Visit: 1 Visit OT Evaluation $OT Eval Moderate Complexity: 1 Mod OT Treatments $Self Care/Home Management : 23-37 mins  Jefferey Pica OTR/L Acute Rehabilitation Services Pager: [phone] Office: [phone]   Soo Steelman C 12/02/2019, 2:18 PM

## 2019-12-02 NOTE — TOC Initial Note (Signed)
Transition of Care [facility]) - Initial/Assessment Note    Patient Details  Name: [name] MRN: [id number] Date of Birth: 08-Jun-1946  Transition of Care [facility]) CM/SW Contact:    Marilu Favre, RN Phone Number: 12/02/2019, 2:42 PM  Clinical Narrative:                  Confirmed face sheet information with patient at bedside. Patient from home alone. Ordered walker and 3 in 1 from [place], arranged home health with [facility]   Patient states her son can transport her home at discharge. Expected Discharge Plan: [place] Barriers to Discharge: No Barriers Identified   Patient Goals and CMS Choice Patient states their goals for this hospitalization and ongoing recovery are:: to go home CMS Medicare.gov Compare Post Acute Care list provided to:: Patient Choice offered to / list presented to : Patient  Expected Discharge Plan and Services Expected Discharge Plan: [place] Choice: Home Health, Durable Medical Equipment Living arrangements for the past 2 months: Single Family Home                 DME Arranged: 3-N-1, Walker rolling DME Agency: [employer] Date DME Agency Contacted: 12/02/19 Time DME Agency Contacted: [phone] Representative spoke with at DME Agency: [place] Arranged: PT, OT, Nurse's Aide [place] Agency: [place] Date [place]: 12/02/19 Time [place]: [place] Representative spoke with at [place] Arrangements/Services Living arrangements for the past 2 months: [place] Lives with:: Self Patient language and need for interpreter reviewed:: Yes Do you feel safe going back to the place where you live?: Yes      Need for Family Participation in Patient Care: Yes (Comment) Care giver support system in place?: Yes (comment)   Criminal Activity/Legal Involvement Pertinent to Current Situation/Hospitalization: No - Comment as needed  Activities of  Daily Living Home Assistive Devices/Equipment: Eyeglasses ADL Screening (condition at time of admission) Patient's cognitive ability adequate to safely complete daily activities?: Yes Is the patient deaf or have difficulty hearing?: No Does the patient have difficulty seeing, even when wearing glasses/contacts?: No Does the patient have difficulty concentrating, remembering, or making decisions?: No Patient able to express need for assistance with ADLs?: Yes Does the patient have difficulty dressing or bathing?: No Independently performs ADLs?: Yes (appropriate for developmental age) Does the patient have difficulty walking or climbing stairs?: No Weakness of Legs: Both Weakness of Arms/Hands: None  Permission Sought/Granted   Permission granted to share information with : Yes, Verbal Permission Granted     Permission granted to share info w AGENCY: [facility], [employer]        Emotional Assessment Appearance:: Appears stated age Attitude/Demeanor/Rapport: Engaged Affect (typically observed): Accepting Orientation: : Oriented to Self, Oriented to Place, Oriented to  Time, Oriented to Situation Alcohol / Substance Use: Not Applicable Psych Involvement: No (comment)  Admission diagnosis:  Hypokalemia [E87.6] Weakness generalized [R53.1] Community acquired pneumonia [J18.9] Diarrhea, unspecified type [R19.7] Community acquired pneumonia, unspecified laterality [J18.9] CAP (community acquired pneumonia) [J18.9] Patient Active Problem List   Diagnosis Date Noted  . CAP (community acquired pneumonia) 12/01/2019  . Community acquired pneumonia 11/30/2019  . Anemia of renal disease 05/26/2019  . Osteopenia 01/28/2018  . Obesity (BMI 30.0-34.9) 01/28/2018  . Routine health maintenance 03/20/2012  . Type 2 diabetes mellitus with other specified complication ([place]) 123XX123  . Dyslipidemia  09/06/2007  . Essential hypertension 09/06/2007   PCP:  Axel Filler,  MD Pharmacy:   [facility] [address], [place] [address] Phone: [phone] Fax: [place], [place] [address] [place] [address] [place] [address] Phone: [phone] Fax: [phone]     Social Determinants of Health (SDOH) Interventions    Readmission Risk Interventions No flowsheet data found.

## 2019-12-02 NOTE — Plan of Care (Signed)

## 2019-12-03 LAB — LEGIONELLA PNEUMOPHILA SEROGP 1 UR AG: L. pneumophila Serogp 1 Ur Ag: NEGATIVE

## 2019-12-15 ENCOUNTER — Ambulatory Visit (INDEPENDENT_AMBULATORY_CARE_PROVIDER_SITE_OTHER): Payer: Medicare Other | Admitting: Internal Medicine

## 2019-12-15 VITALS — BP 132/69 | HR 69 | Temp 98.1°F | Ht 65.0 in | Wt 171.8 lb

## 2019-12-15 DIAGNOSIS — M545 Low back pain, unspecified: Secondary | ICD-10-CM | POA: Insufficient documentation

## 2019-12-15 DIAGNOSIS — Z8701 Personal history of pneumonia (recurrent): Secondary | ICD-10-CM

## 2019-12-15 DIAGNOSIS — R52 Pain, unspecified: Secondary | ICD-10-CM

## 2019-12-15 DIAGNOSIS — R109 Unspecified abdominal pain: Secondary | ICD-10-CM

## 2019-12-15 DIAGNOSIS — R197 Diarrhea, unspecified: Secondary | ICD-10-CM

## 2019-12-15 NOTE — Progress Notes (Signed)
Internal Medicine Clinic Attending  Case discussed with Dr. Agyei at the time of the visit.  We reviewed the resident's history and exam and pertinent patient test results.  I agree with the assessment, diagnosis, and plan of care documented in the resident's note.    

## 2019-12-15 NOTE — Assessment & Plan Note (Addendum)
Back pain, abdominal pain (nonspecific): [name] was recently admitted to the hospital from November 29, 2018 to December 01, 2018 for treatment of community-acquired pneumonia.  She was treated with ceftriaxone and azithromycin which was subsequently transitioned to Augmentin and azithromycin.  During the course of taking her oral antibiotics she began experiencing diarrhea as well as lower pelvic pain which has now referred to her back as well as gluteal area.  Currently, she states her stools are becoming formed and denies fevers, chills, hematochezia, bright red blood per rectum, melena, urinary frequency, dysuria or urinary urgency.  She states that her back pain improves after she has a bowel movement.  On physical exams, range of motion is intact.  Lumbar area is not tender to palpation.  Abdominal examination is unremarkable.  Straight leg test is unremarkable bilaterally.  Assessment: Given recent antibiotic use, diarrhea and back pain that improves with bowel movement, this could be referred pain from GI etiology.  It is reassuring that her stools are becoming formed and she currently denies any systemic findings.  Plan: -Follow-up CMP, CBC to rule out infectious or electrolyte abnormalities -RTC in 1 week if symptoms do not abate

## 2019-12-15 NOTE — Progress Notes (Signed)
   CC: Back pain that is relieved with bowel movement   HPI:  [name] is a 74 y.o. very pleasant African-American woman with medical history listed below presenting for evaluation of back pain.  Please see problem based charting for further details.  Past Medical History:  Diagnosis Date  . Diabetes mellitus   . GERD (gastroesophageal reflux disease)   . HLD (hyperlipidemia)   . Hypertension    Review of Systems:  As per HPI  Physical Exam:  Vitals:   12/15/19 0947  BP: 132/69  Pulse: 69  Temp: 98.1 F (36.7 C)  TempSrc: Oral  SpO2: 100%  Weight: 171 lb 12.8 oz (77.9 kg)  Height: 5\' 5"  (1.651 m)   Physical Exam  Constitutional: She is well-developed, well-nourished, and in no distress. No distress.  HENT:  Head: Normocephalic and atraumatic.  Cardiovascular: Normal rate, regular rhythm and normal heart sounds.  No murmur heard. Pulmonary/Chest: Effort normal and breath sounds normal. She has no wheezes.  Abdominal: Soft. Bowel sounds are normal. She exhibits no distension. There is no abdominal tenderness. There is no rebound.  Musculoskeletal:        General: Normal range of motion.     Comments: -Lumbar spine nontender to palpation -Range of motion intact -Straight leg test negative bilaterally   Skin: She is not diaphoretic.    Assessment & Plan:   See Encounters Tab for problem based charting.  Patient discussed with Dr. Philipp Ovens

## 2019-12-15 NOTE — Patient Instructions (Signed)
Ms. Mcpheron,   It was a pleasure taking care of you. I am checking blood work today to make sure that your abdominal pain is not from an infection.   Thanks.

## 2019-12-15 NOTE — Addendum Note (Signed)
Addended by: Jean Rosenthal on: 12/15/2019 01:16 PM   Modules accepted: Level of Service

## 2019-12-16 ENCOUNTER — Telehealth: Payer: Self-pay | Admitting: Internal Medicine

## 2019-12-16 LAB — CMP14 + ANION GAP
ALT: 17 IU/L (ref 0–32)
AST: 18 IU/L (ref 0–40)
Albumin/Globulin Ratio: 1 — ABNORMAL LOW (ref 1.2–2.2)
Albumin: 3.6 g/dL — ABNORMAL LOW (ref 3.7–4.7)
Alkaline Phosphatase: 103 IU/L (ref 39–117)
Anion Gap: 15 mmol/L (ref 10.0–18.0)
BUN/Creatinine Ratio: 19 (ref 12–28)
BUN: 18 mg/dL (ref 8–27)
Bilirubin Total: 0.6 mg/dL (ref 0.0–1.2)
CO2: 23 mmol/L (ref 20–29)
Calcium: 9.7 mg/dL (ref 8.7–10.3)
Chloride: 103 mmol/L (ref 96–106)
Creatinine, Ser: 0.96 mg/dL (ref 0.57–1.00)
GFR calc Af Amer: 68 mL/min/{1.73_m2} (ref >59)
GFR calc non Af Amer: 59 mL/min/{1.73_m2} — ABNORMAL LOW (ref >59)
Globulin, Total: 3.7 g/dL (ref 1.5–4.5)
Glucose: 102 mg/dL — ABNORMAL HIGH (ref 65–99)
Potassium: 4.2 mmol/L (ref 3.5–5.2)
Sodium: 141 mmol/L (ref 134–144)
Total Protein: 7.3 g/dL (ref 6.0–8.5)

## 2019-12-16 LAB — CBC
Hematocrit: 34.7 % (ref 34.0–46.6)
Hemoglobin: 11.6 g/dL (ref 11.1–15.9)
MCH: 31.7 pg (ref 26.6–33.0)
MCHC: 33.4 g/dL (ref 31.5–35.7)
MCV: 95 fL (ref 79–97)
Platelets: 276 10*3/uL (ref 150–450)
RBC: 3.66 x10E6/uL — ABNORMAL LOW (ref 3.77–5.28)
RDW: 13.1 % (ref 11.7–15.4)
WBC: 4.4 10*3/uL (ref 3.4–10.8)

## 2019-12-16 NOTE — Telephone Encounter (Signed)
Spoke to [name] about labs.

## 2020-01-28 DIAGNOSIS — M25559 Pain in unspecified hip: Secondary | ICD-10-CM | POA: Diagnosis not present

## 2020-01-28 DIAGNOSIS — M5431 Sciatica, right side: Secondary | ICD-10-CM | POA: Diagnosis not present

## 2020-01-28 DIAGNOSIS — M545 Low back pain: Secondary | ICD-10-CM | POA: Diagnosis not present

## 2020-02-16 ENCOUNTER — Ambulatory Visit (INDEPENDENT_AMBULATORY_CARE_PROVIDER_SITE_OTHER): Payer: Medicare Other | Admitting: Student in an Organized Health Care Education/Training Program

## 2020-02-16 ENCOUNTER — Other Ambulatory Visit: Payer: Self-pay

## 2020-02-16 ENCOUNTER — Encounter: Payer: Self-pay | Admitting: Student in an Organized Health Care Education/Training Program

## 2020-02-16 VITALS — BP 120/64 | HR 65 | Temp 98.1°F | Ht 65.0 in | Wt 183.0 lb

## 2020-02-16 DIAGNOSIS — I10 Essential (primary) hypertension: Secondary | ICD-10-CM | POA: Diagnosis not present

## 2020-02-16 DIAGNOSIS — G8929 Other chronic pain: Secondary | ICD-10-CM

## 2020-02-16 DIAGNOSIS — E1169 Type 2 diabetes mellitus with other specified complication: Secondary | ICD-10-CM

## 2020-02-16 DIAGNOSIS — M545 Low back pain: Secondary | ICD-10-CM | POA: Diagnosis not present

## 2020-02-16 DIAGNOSIS — Z79899 Other long term (current) drug therapy: Secondary | ICD-10-CM

## 2020-02-16 DIAGNOSIS — Z7984 Long term (current) use of oral hypoglycemic drugs: Secondary | ICD-10-CM | POA: Diagnosis not present

## 2020-02-16 DIAGNOSIS — Z0000 Encounter for general adult medical examination without abnormal findings: Secondary | ICD-10-CM

## 2020-02-16 NOTE — Assessment & Plan Note (Signed)
Blood pressure well controlled.  Continue with chlorthalidone 25 mg daily.  Labs 2 months ago were okay.

## 2020-02-16 NOTE — Assessment & Plan Note (Signed)
Historically very well controlled on pioglitazone which we will continue.  Checking A1c is about every 6 months.  Due for urine microalbumin but unable to give sample today.  Will check at next visit.  She says she did follow-up with her eye doctor, we have not received the report yet.

## 2020-02-16 NOTE — Progress Notes (Signed)
   Assessment and Plan:  See Encounters tab for problem-based medical decision making.   __________________________________________________________  HPI:   74 year old person here for follow-up of hypertension and diabetes.  Reports doing well with her medications with no side effects.  Visited an urgent care about a month ago for acute right-sided hip and low back pain.  Says that they did x-rays and prescribed her prednisone, meloxicam, cyclobenzaprine.  And her symptoms.  Stopped taking the medicine for a while.  Pain came back and she took it again with benefit.  I advised not using prednisone for this kind of muscle pain in the future because of the effects on her blood sugar.  She reports the pain is not function limiting.  No fevers or chills.  No falls.  No personal history of cancer.  __________________________________________________________  Problem List: Patient Active Problem List   Diagnosis Date Noted  . Type 2 diabetes mellitus with other specified complication ([place]) 123XX123    Priority: High  . Essential hypertension 09/06/2007    Priority: High  . Anemia of renal disease 05/26/2019    Priority: Low  . Osteopenia 01/28/2018    Priority: Low  . Obesity (BMI 30.0-34.9) 01/28/2018    Priority: Low  . Routine health maintenance 03/20/2012    Priority: Low  . Dyslipidemia 09/06/2007    Priority: Low  . Low back pain 12/15/2019    Medications: Reconciled today in Epic __________________________________________________________  Physical Exam:  Vital Signs: Vitals:   02/16/20 0836  BP: 120/64  Pulse: 65  Temp: 98.1 F (36.7 C)  TempSrc: Oral  Weight: 183 lb (83 kg)  Height: 5\' 5"  (1.651 m)    Gen: Well appearing, NAD Neck: No cervical LAD, No thyromegaly or nodules CV: RRR, no murmurs Pulm: Normal effort, CTA throughout, no wheezing Abd: Soft, NT, ND  Ext: Warm, no edema, normal joints.  No pain with straight leg raise tests bilaterally.  No pain  with internal rotation of the legs bilaterally.

## 2020-02-16 NOTE — Assessment & Plan Note (Signed)
Patient with subacute pain at the right lower back, around SI joints, radiates behind the leg and stops at the knee.  No true radicular pain.  On exam her right hip has good range of motion and no pain with internal rotation.  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  Not function limiting.  No red flag symptoms.  She reports an x-ray at an urgent care, I cannot see the results, I do not see a significant need to repeat this today.  Plan will be conservative management, meloxicam 15 mg as needed daily and cyclobenzaprine 5 mg as needed daily.  She can call for refills.

## 2020-02-16 NOTE — Assessment & Plan Note (Signed)
Patient is anxious about the possibility of having an undiagnosed illness such as cancer.  She is up-to-date on all age-appropriate cancer screening.  She is a never smoker.  She has received a Covid vaccine.  I gave her reassurance that she is doing everything she can to monitor for cancer at this time.

## 2020-02-16 NOTE — Patient Instructions (Signed)
It was great seeing you today.  Your blood pressure is under excellent control.  Continue your medicines as you are taking it.  We talked about your right hip and low back pain.  Continue taking the muscle relaxer and anti-inflammatory medicine only on the bad days.  Let me know if you run out of that medicine and I will send a refill.  Continue exercising and eating foods.  Come back to clinic in 3 months and we will check your blood sugar at that time.

## 2020-05-27 ENCOUNTER — Other Ambulatory Visit: Payer: Self-pay | Admitting: *Deleted

## 2020-05-27 DIAGNOSIS — E119 Type 2 diabetes mellitus without complications: Secondary | ICD-10-CM

## 2020-05-27 MED ORDER — PIOGLITAZONE HCL 30 MG PO TABS: 30.0000 mg | ORAL_TABLET | Freq: Every day | ORAL | 3 refills | Status: DC

## 2020-06-15 DIAGNOSIS — J029 Acute pharyngitis, unspecified: Secondary | ICD-10-CM | POA: Diagnosis not present

## 2020-06-15 DIAGNOSIS — Z20822 Contact with and (suspected) exposure to covid-19: Secondary | ICD-10-CM | POA: Diagnosis not present

## 2020-06-18 ENCOUNTER — Other Ambulatory Visit: Payer: Self-pay | Admitting: *Deleted

## 2020-06-18 DIAGNOSIS — E785 Hyperlipidemia, unspecified: Secondary | ICD-10-CM

## 2020-06-18 DIAGNOSIS — E119 Type 2 diabetes mellitus without complications: Secondary | ICD-10-CM

## 2020-06-18 DIAGNOSIS — I10 Essential (primary) hypertension: Secondary | ICD-10-CM

## 2020-06-18 MED ORDER — PIOGLITAZONE HCL 30 MG PO TABS: 30.0000 mg | ORAL_TABLET | Freq: Every day | ORAL | 3 refills | Status: DC

## 2020-06-18 MED ORDER — SIMVASTATIN 40 MG PO TABS: 40.0000 mg | ORAL_TABLET | Freq: Every day | ORAL | 3 refills | Status: DC

## 2020-06-18 MED ORDER — CHLORTHALIDONE 25 MG PO TABS: 25.0000 mg | ORAL_TABLET | Freq: Every day | ORAL | 3 refills | Status: DC

## 2020-07-08 ENCOUNTER — Other Ambulatory Visit (HOSPITAL_COMMUNITY)
Admission: RE | Admit: 2020-07-08 | Discharge: 2020-07-08 | Disposition: A | Discharge status: Home / Self Care | Payer: Medicare Other | Source: Ambulatory Visit | Attending: Student in an Organized Health Care Education/Training Program | Admitting: Student in an Organized Health Care Education/Training Program

## 2020-07-08 ENCOUNTER — Encounter: Payer: Self-pay | Admitting: Internal Medicine

## 2020-07-08 ENCOUNTER — Ambulatory Visit (INDEPENDENT_AMBULATORY_CARE_PROVIDER_SITE_OTHER): Payer: Medicare Other | Admitting: Internal Medicine

## 2020-07-08 ENCOUNTER — Other Ambulatory Visit: Payer: Self-pay

## 2020-07-08 VITALS — BP 125/57 | HR 69 | Temp 98.2°F | Ht 65.0 in | Wt 189.2 lb

## 2020-07-08 DIAGNOSIS — Z124 Encounter for screening for malignant neoplasm of cervix: Secondary | ICD-10-CM | POA: Diagnosis present

## 2020-07-08 DIAGNOSIS — Z7689 Persons encountering health services in other specified circumstances: Secondary | ICD-10-CM | POA: Diagnosis not present

## 2020-07-08 DIAGNOSIS — Z202 Contact with and (suspected) exposure to infections with a predominantly sexual mode of transmission: Secondary | ICD-10-CM | POA: Diagnosis not present

## 2020-07-08 DIAGNOSIS — Z0000 Encounter for general adult medical examination without abnormal findings: Secondary | ICD-10-CM

## 2020-07-08 NOTE — Assessment & Plan Note (Signed)
She mentions that her female sex partner had some pain (not sure about discharge) after sex last week and when he saw a doctor, he received Azithromycin and a shot for probable STD. (He was told that he did not have infection but received the treatment.)  Patient is asymptomatic and denies vaginal discharge, bleeding, abdominal pain, fever or chills. We will evaluate her for STD given suspicious STD exposure.  -Performing Pap smear, checking C/G, BV, Candida. -HIV and RPR

## 2020-07-08 NOTE — Progress Notes (Signed)
Internal Medicine Clinic Attending  Case discussed with Dr. Masoudi  At the time of the visit.  We reviewed the resident's history and exam and pertinent patient test results.  I agree with the assessment, diagnosis, and plan of care documented in the resident's note.  

## 2020-07-08 NOTE — Assessment & Plan Note (Signed)
Patient has received COVID-19 vaccination.

## 2020-07-08 NOTE — Patient Instructions (Addendum)
Thank you for allowing [place] to provide your care today. Today you came in to be checked for STD.  We performed pap smear. We will call you when we have the result from the lab.  Today we made no changes to your medications.    Please come back to clinic to see your primary care within 3 months or earlier as needed. As always, if having severe symptoms, please seek medical attention at emergency room. Should you have any questions or concerns please call the internal medicine clinic at [phone].    Thank you!

## 2020-07-08 NOTE — Addendum Note (Signed)
Addended by: Lalla Brothers T on: 07/08/2020 04:34 PM   Modules accepted: Level of Service

## 2020-07-08 NOTE — Progress Notes (Signed)
   CC: STD check  HPI:  [name] is a 74 y.o. female with PMHx as documented below, presented to be checked for STD and asks for Pap smear. Please refer to problem based charting for further details and assessment and plan of current problem and chronic medical conditions.  Past Medical History:  Diagnosis Date  . Diabetes mellitus   . GERD (gastroesophageal reflux disease)   . HLD (hyperlipidemia)   . Hypertension    Review of Systems:   Constitutional: Negative for chills and fever.  GU: No vaginal discharge GI: No abdominal pain  Physical Exam:  Vitals:   07/08/20 0944  BP: (!) 125/57  Pulse: 69  Temp: 98.2 F (36.8 C)  TempSrc: Oral  SpO2: 100%  Weight: 189 lb 3.2 oz (85.8 kg)  Height: 5\' 5"  (1.651 m)   Constitutional: Well-developed and well-nourished. No acute distress.  Cardiovascular:  RRR, nl S1S2, no murmur,  no LEE Respiratory: Effort normal and breath sounds normal. No respiratory distress. No wheezes.  BV:PLWU, [name] is no tenderness.  Neurological: Is alert and oriented x 3  Psychiatric:  Normal mood and affect. Behavior is normal. Judgment and thought content normal.   Assessment & Plan:   See Encounters Tab for problem based charting.  Patient discussed with Dr. Evette Doffing

## 2020-07-09 LAB — CYTOLOGY - PAP: Diagnosis: NEGATIVE

## 2020-07-09 LAB — CERVICOVAGINAL ANCILLARY ONLY
Bacterial Vaginitis (gardnerella): NEGATIVE
Candida Glabrata: NEGATIVE
Candida Vaginitis: NEGATIVE
Chlamydia: NEGATIVE
Comment: NEGATIVE
Comment: NEGATIVE
Comment: NEGATIVE
Comment: NEGATIVE
Comment: NEGATIVE
Comment: NORMAL
Neisseria Gonorrhea: NEGATIVE
Trichomonas: NEGATIVE

## 2020-07-09 LAB — RPR: RPR Ser Ql: NONREACTIVE

## 2020-07-09 LAB — HIV ANTIBODY (ROUTINE TESTING W REFLEX): HIV Screen 4th Generation wRfx: NONREACTIVE

## 2020-07-11 ENCOUNTER — Telehealth: Payer: Self-pay | Admitting: Internal Medicine

## 2020-07-13 ENCOUNTER — Telehealth: Payer: Self-pay | Admitting: Student in an Organized Health Care Education/Training Program

## 2020-07-13 NOTE — Telephone Encounter (Signed)
Pt calling back about her test results.

## 2020-07-13 NOTE — Telephone Encounter (Signed)
Called patient and informed her of negative tests.

## 2020-08-16 ENCOUNTER — Telehealth: Payer: Self-pay

## 2020-08-16 NOTE — Telephone Encounter (Signed)
Requesting bp med to be filled @  [facility], [place] - [address] AT [place] Phone:  [phone]  Fax:  [phone]     Pt do not know the name of the med, requesting the med to be filled by today. Please call pt back.

## 2020-08-16 NOTE — Telephone Encounter (Signed)
Chlorthalidone #90 with 3 refills sent 06/18/2020. Call placed to Kim at [facility]. He has this Rx and will get it ready for her today after 1300. He will leave her a note to contact them for future refills. Hubbard Hartshorn, BSN, RN-BC

## 2020-08-23 DIAGNOSIS — Z1231 Encounter for screening mammogram for malignant neoplasm of breast: Secondary | ICD-10-CM | POA: Diagnosis not present

## 2020-08-23 DIAGNOSIS — Z803 Family history of malignant neoplasm of breast: Secondary | ICD-10-CM | POA: Diagnosis not present

## 2020-09-11 DIAGNOSIS — J209 Acute bronchitis, unspecified: Secondary | ICD-10-CM | POA: Diagnosis not present

## 2020-09-11 DIAGNOSIS — M545 Low back pain, unspecified: Secondary | ICD-10-CM | POA: Diagnosis not present

## 2020-10-19 ENCOUNTER — Ambulatory Visit (INDEPENDENT_AMBULATORY_CARE_PROVIDER_SITE_OTHER): Payer: Medicare Other | Admitting: *Deleted

## 2020-10-19 DIAGNOSIS — Z23 Encounter for immunization: Secondary | ICD-10-CM

## 2020-10-22 ENCOUNTER — Encounter: Payer: Self-pay | Admitting: *Deleted

## 2020-10-22 NOTE — Progress Notes (Signed)

## 2020-10-25 NOTE — Progress Notes (Signed)
Things That May Be Affecting Your Health:  Alcohol  Hearing loss  Pain    Depression  Home Safety  Sexual Health  x Diabetes  Lack of physical activity x Stress   Difficulty with daily activities  Loneliness  Tiredness   Drug use  Medicines  Tobacco use   Falls  Motor Vehicle Safety  Weight   Food choices  Oral Health  Other    YOUR PERSONALIZED HEALTH PLAN : 1. Schedule your next subsequent Medicare Wellness visit in one year 2. Attend all of your regular appointments to address your medical issues 3. Complete the preventative screenings and services   Annual Wellness Visit   Medicare Covered Preventative Screenings and [place] Men and Women Who How Often Need? Date of Last Service Action  Abdominal Aortic Aneurysm Adults with AAA risk factors Once     Alcohol Misuse and Counseling All Adults Screening once a year if no alcohol misuse. Counseling up to 4 face to face sessions. Y    Bone Density Measurement  Adults at risk for osteoporosis Once every 2 yrs     Lipid Panel Z13.6 All adults without CV disease Once every 5 yrs     Colorectal Cancer   Stool sample or  Colonoscopy All adults 27 and older   Once every year  Every 10 years     Depression All Adults Once a year  Today   Diabetes Screening Blood glucose, post glucose load, or GTT Z13.1  All adults at risk  Pre-diabetics  Once per year  Twice per year     Diabetes  Self-Management Training All adults Diabetics 10 hrs first year; 2 hours subsequent years. Requires Copay     Glaucoma  Diabetics  Family history of glaucoma  African Americans 82 yrs +  Hispanic Americans 30 yrs + Annually - requires coppay     Hepatitis C Z72.89 or F19.20  High Risk for HCV  Born between 1945 and 1965  Annually  Once     HIV Z11.4 All adults based on risk  Annually btw ages 74 & 54 regardless of risk  Annually > 65 yrs if at increased risk     Lung Cancer Screening Asymptomatic adults aged  23-77 with 30 pack yr history and current smoker OR quit within the last 15 yrs Annually Must have counseling and shared decision making documentation before first screen     Medical Nutrition Therapy Adults with   Diabetes  Renal disease  Kidney transplant within past 3 yrs 3 hours first year; 2 hours subsequent years     Obesity and Counseling All adults Screening once a year Counseling if BMI 30 or higher  Today   Tobacco Use Counseling Adults who use tobacco  Up to 8 visits in one year     Vaccines Z23  Hepatitis B  Influenza   Pneumonia  Adults   Once  Once every flu season  Two different vaccines separated by one year     Next Annual Wellness Visit People with Medicare Every year  Today     Services & Screenings Women Who How Often Need  Date of Last Service Action  Mammogram  Z12.31 Women over 70 One baseline ages 4-39. Annually ager 54 yrs+ Y    Pap tests All women Annually if high risk. Every 2 yrs for normal risk women     Screening for cervical cancer with   Pap (Z01.419 nl or Z01.411abnl) &  HPV Z11.51 Women aged 51 to 43 Once every 5 yrs     Screening pelvic and breast exams All women Annually if high risk. Every 2 yrs for normal risk women     Sexually Transmitted Diseases  Chlamydia  Gonorrhea  Syphilis All at risk adults Annually for non pregnant females at increased risk         [place] Men Who How Ofter Need  Date of Last Service Action  Prostate Cancer - DRE & PSA Men over 50 Annually.  DRE might require a copay.     Sexually Transmitted Diseases  Syphilis All at risk adults Annually for men at increased risk

## 2021-03-14 ENCOUNTER — Encounter: Payer: Self-pay | Admitting: Student in an Organized Health Care Education/Training Program

## 2021-03-14 ENCOUNTER — Ambulatory Visit (INDEPENDENT_AMBULATORY_CARE_PROVIDER_SITE_OTHER): Payer: BC Managed Care – PPO | Admitting: Student in an Organized Health Care Education/Training Program

## 2021-03-14 VITALS — BP 142/65 | HR 56 | Temp 98.3°F | Ht 65.0 in | Wt 193.1 lb

## 2021-03-14 DIAGNOSIS — E785 Hyperlipidemia, unspecified: Secondary | ICD-10-CM

## 2021-03-14 DIAGNOSIS — I10 Essential (primary) hypertension: Secondary | ICD-10-CM

## 2021-03-14 DIAGNOSIS — Z0000 Encounter for general adult medical examination without abnormal findings: Secondary | ICD-10-CM

## 2021-03-14 DIAGNOSIS — E1169 Type 2 diabetes mellitus with other specified complication: Secondary | ICD-10-CM | POA: Diagnosis not present

## 2021-03-14 DIAGNOSIS — Z1211 Encounter for screening for malignant neoplasm of colon: Secondary | ICD-10-CM | POA: Diagnosis not present

## 2021-03-14 LAB — GLUCOSE, CAPILLARY: Glucose-Capillary: 140 mg/dL — ABNORMAL HIGH (ref 70–99)

## 2021-03-14 LAB — POCT GLYCOSYLATED HEMOGLOBIN (HGB A1C): Hemoglobin A1C: 7 % — AB (ref 4.0–5.6)

## 2021-03-14 NOTE — Progress Notes (Signed)
   Assessment and Plan:  See Encounters tab for problem-based medical decision making.   __________________________________________________________  HPI:   75 year old person here for follow-up of diabetes and hypertension.  Its been 15 months since I have last seen her, reports doing well in that time.  Denies any recent illnesses, no hospitalizations, no ED visits.  Reports good adherence to her medications, no recent changes, no side effects.  Says that she gets exercise through work which is busy, denies any chest pain, pressure, or shortness of breath.  Denies any polyuria or polydipsia.  __________________________________________________________  Problem List: Patient Active Problem List   Diagnosis Date Noted  . Type 2 diabetes mellitus with other specified complication ([place]) 25/36/6440    Priority: High  . Essential hypertension 09/06/2007    Priority: High  . Osteopenia 01/28/2018    Priority: Low  . Obesity (BMI 30.0-34.9) 01/28/2018    Priority: Low  . Routine health maintenance 03/20/2012    Priority: Low  . Dyslipidemia 09/06/2007    Priority: Low    Medications: Reconciled today in Epic __________________________________________________________  Physical Exam:  Vital Signs: Vitals:   03/14/21 0907  BP: (!) 142/65  Pulse: (!) 56  Temp: 98.3 F (36.8 C)  TempSrc: Oral  SpO2: 100%  Weight: 193 lb 1.6 oz (87.6 kg)  Height: 5\' 5"  (1.651 m)    Gen: Well appearing, NAD Neck: No cervical LAD, No thyromegaly or nodules CV: RRR, no murmurs Pulm: Normal effort, CTA throughout, no wheezing Ext: Warm, no edema, normal joints

## 2021-03-14 NOTE — Assessment & Plan Note (Signed)
Plan to check lipids today, this is primary prevention of ischemic vascular disease.  Continuing with simvastatin 40 mg daily which she is tolerating well.  She has historically used low-dose aspirin, this is optional.

## 2021-03-14 NOTE — Assessment & Plan Note (Signed)
Blood pressure in an acceptable range.  Plan to continue with chlorthalidone 25 mg daily.  May benefit in the future for addition of an ARB if she has other complications like nephropathy or proteinuria.  For now we will continue with chlorthalidone and check a BMP.

## 2021-03-14 NOTE — Assessment & Plan Note (Signed)
Patient completed primary COVID vaccination series.  Due for colon cancer screening, last done 5 years ago but had a sessile polyp that needs closer follow-up.  We do not have records of her latest mammogram, patient tells me that she goes every October, but last result was from 2019, we will see if we can obtain these records.

## 2021-03-14 NOTE — Assessment & Plan Note (Signed)
Hemoglobin A1c under excellent control at 7.0%.  Patient cannot tolerate metformin.  Plan to continue with pioglitazone 30 mg daily.  If she develops future complications like nephropathy or retinopathy I would change this to an SGLT2 inhibitor, but seems to be working well for her now.  Foot exam was normal today.  Checking urine microalbumin.

## 2021-03-15 ENCOUNTER — Encounter: Payer: Self-pay | Admitting: Student in an Organized Health Care Education/Training Program

## 2021-03-15 LAB — BMP8+ANION GAP
Anion Gap: 20 mmol/L — ABNORMAL HIGH (ref 10.0–18.0)
BUN/Creatinine Ratio: 17 (ref 12–28)
BUN: 17 mg/dL (ref 8–27)
CO2: 21 mmol/L (ref 20–29)
Calcium: 9.4 mg/dL (ref 8.7–10.3)
Chloride: 99 mmol/L (ref 96–106)
Creatinine, Ser: 1.02 mg/dL — ABNORMAL HIGH (ref 0.57–1.00)
Glucose: 113 mg/dL — ABNORMAL HIGH (ref 65–99)
Potassium: 3.6 mmol/L (ref 3.5–5.2)
Sodium: 140 mmol/L (ref 134–144)
eGFR: 58 mL/min/{1.73_m2} — ABNORMAL LOW (ref >59)

## 2021-03-15 LAB — LIPID PANEL
Chol/HDL Ratio: 2.8 ratio (ref 0.0–4.4)
Cholesterol, Total: 183 mg/dL (ref 100–199)
HDL: 66 mg/dL (ref >39)
LDL Chol Calc (NIH): 108 mg/dL — ABNORMAL HIGH (ref 0–99)
Triglycerides: 48 mg/dL (ref 0–149)
VLDL Cholesterol Cal: 9 mg/dL (ref 5–40)

## 2021-03-15 LAB — MICROALBUMIN / CREATININE URINE RATIO
Creatinine, Urine: 97.8 mg/dL
Microalb/Creat Ratio: 3 mg/g creat (ref 0–29)
Microalbumin, Urine: 3.1 ug/mL

## 2021-03-17 ENCOUNTER — Telehealth: Payer: Self-pay

## 2021-03-17 NOTE — Telephone Encounter (Signed)
Yes, that is why I was wondering if it's been repeated since 2017.  If I don't hear back from the GI office soon regarding if they've repeated it already, I will call them again to follow up. Thank you Dr. Evette Doffing! Almando Brawley

## 2021-03-17 NOTE — Telephone Encounter (Signed)
Pt is requesting a call back  She stated that Dr Evette Doffing sent her to get a colostomy but they told her it is not time for her to get one so she does not know what to do

## 2021-03-17 NOTE — Telephone Encounter (Signed)
The report we have from colonoscopy in 2017 said she had 4 polyps removed and to repeat in 3-5 years. So unless that recommendation has changed, I think she is due.

## 2021-03-17 NOTE — Telephone Encounter (Signed)
RTC to patient; RN asked patient if she had an appt with Dr. Benson Norway d/t need for colonoscopy.  Patient states she did not have an appointment, but she went to their office and was told she did not need one yet, that it wasn't time.  Informed patient the last Colonoscopy [facility] has record of was 2017, asked her if she's had one since that time and she can't remember.  Pt states she was given a form to fill out so Dr. Ulyses Amor office could send records to [facility].  RN placed TC to Dr. Ulyses Amor office, left message for Lattie Haw in medical records that [facility] referred patient in 2016 and another referral was sent 03/14/21.  Informed that the last colonoscopy report [facility] had was the one from 2017 and if there are more current reports to please fax, as we are the referring physician they should not need medical release.   SChaplin, RN,BSN

## 2021-03-21 NOTE — Telephone Encounter (Signed)
Great. Mystery solved. Can you let the patient know that she is not due for colon cancer screening until 2027.

## 2021-03-21 NOTE — Telephone Encounter (Signed)
Patient called and notified she is not due for another colon cancer screening until 2027 per Dr. Autumn Patty instructions. SChaplin, RN,BSN

## 2021-03-21 NOTE — Telephone Encounter (Signed)
Hi Dr. Evette Doffing,  We received a colonoscopy report from 2020, it is in your box. Thank you, Tegan Burnside

## 2021-05-25 DIAGNOSIS — E119 Type 2 diabetes mellitus without complications: Secondary | ICD-10-CM | POA: Diagnosis not present

## 2021-05-25 DIAGNOSIS — I10 Essential (primary) hypertension: Secondary | ICD-10-CM | POA: Diagnosis not present

## 2021-05-25 DIAGNOSIS — L03011 Cellulitis of right finger: Secondary | ICD-10-CM | POA: Diagnosis not present

## 2021-06-15 DIAGNOSIS — E119 Type 2 diabetes mellitus without complications: Secondary | ICD-10-CM | POA: Diagnosis not present

## 2021-06-15 DIAGNOSIS — L03011 Cellulitis of right finger: Secondary | ICD-10-CM | POA: Diagnosis not present

## 2021-06-15 DIAGNOSIS — E7800 Pure hypercholesterolemia, unspecified: Secondary | ICD-10-CM | POA: Diagnosis not present

## 2021-06-15 DIAGNOSIS — I10 Essential (primary) hypertension: Secondary | ICD-10-CM | POA: Diagnosis not present

## 2021-06-22 IMAGING — DX DG CHEST 1V PORT
1 series · 1 of 1 positions shown · non-contrast
Comparison: Radiograph 11/27/2019

CLINICAL DATA: Cough, body aches, chest pain

EXAM:
PORTABLE CHEST 1 VIEW

[chest]
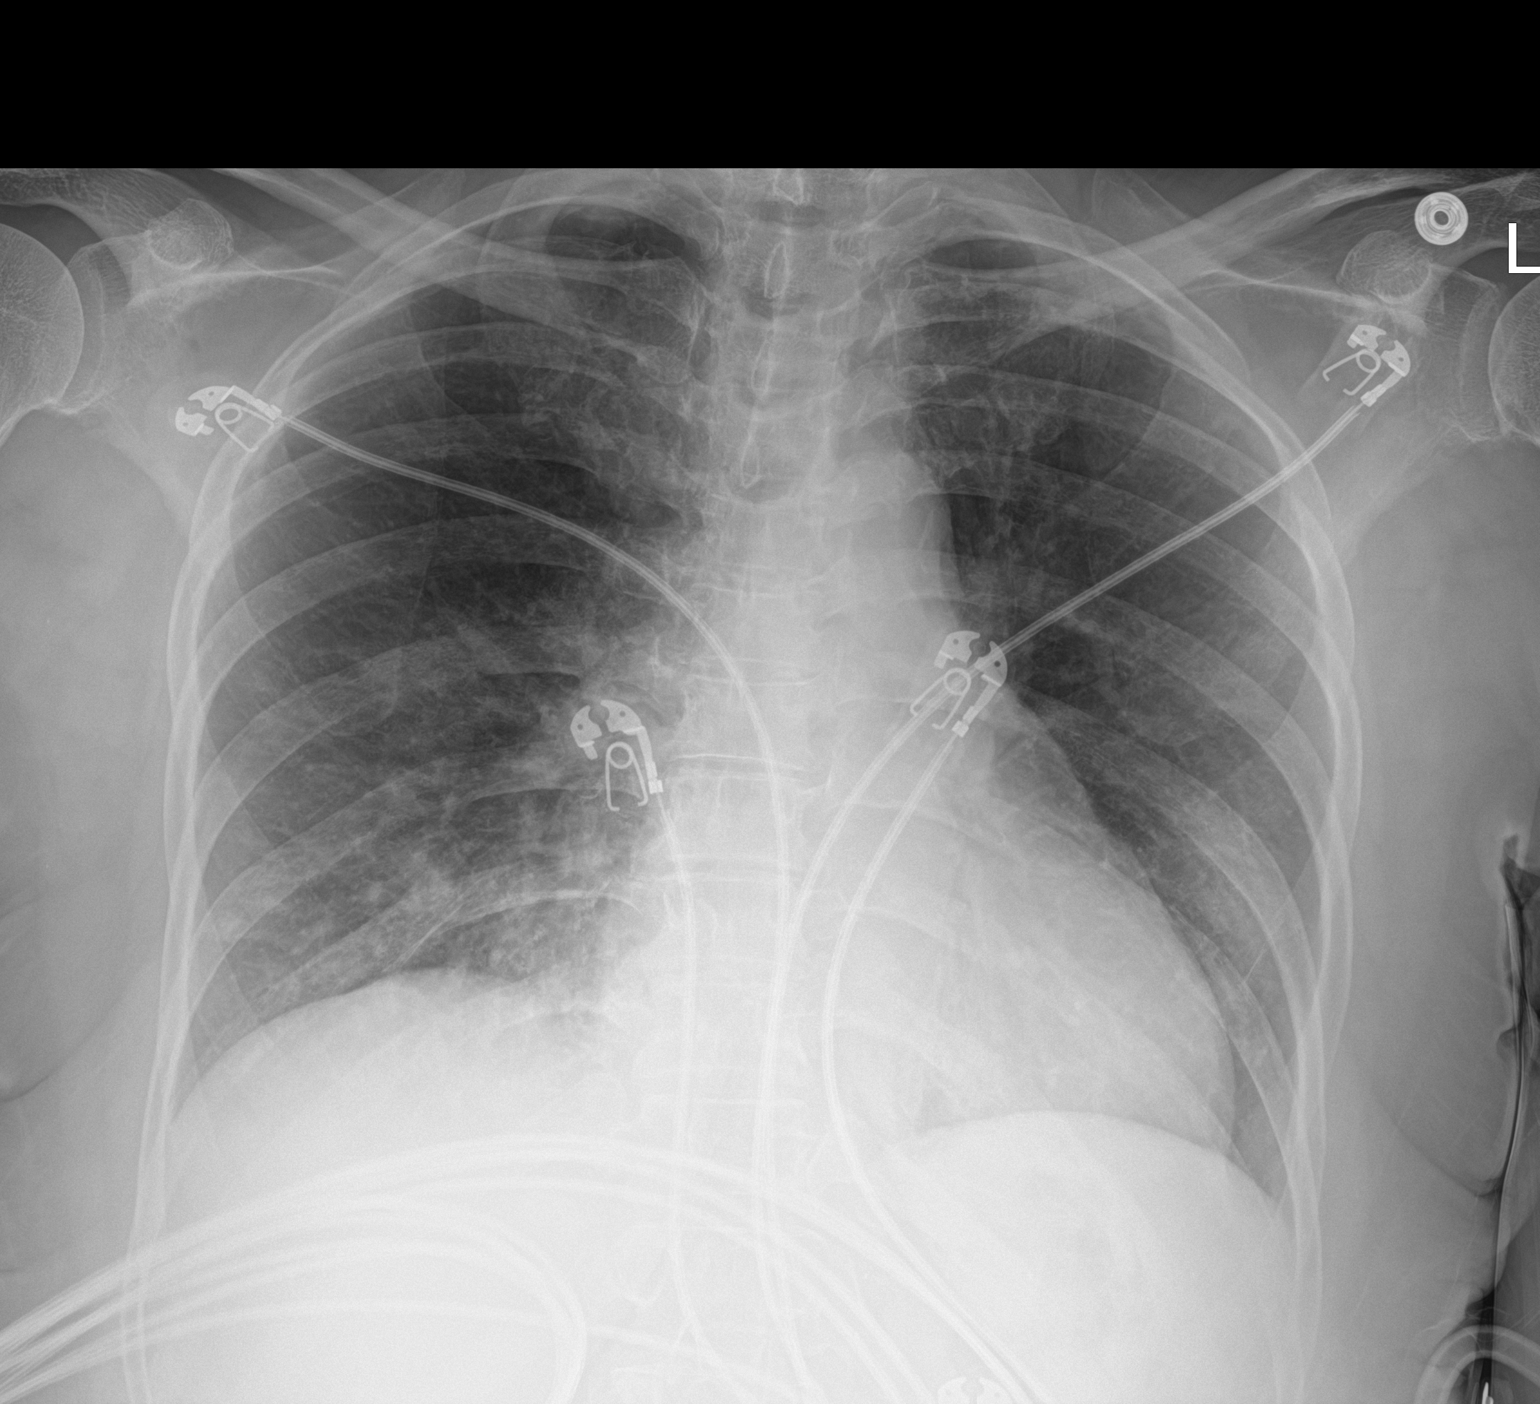

[1 of 1 positions shown; findings below may reference images not displayed]

FINDINGS: Increasing patchy bibasilar airspace opacities more focally
confluent in the right lower lung. Nodular airspace opacity
previously identified in the and left lung periphery may be part of
this acute airspace process. No pneumothorax. No effusion. The
cardiomediastinal contours are unremarkable. No acute osseous or
soft tissue abnormality.
IMPRESSION: Increasing patchy bibasilar airspace opacities, more focally
confluent in the right lower lung, most compatible with pneumonia in
the given clinical setting.

A nodular airspace opacity in the periphery of the left lower lung
may be part of this acute airspace process. Recommend continued
radiography to resolution. If nodular opacity persists, CT imaging
could be obtained.

## 2021-07-04 ENCOUNTER — Other Ambulatory Visit: Payer: Self-pay | Admitting: Student in an Organized Health Care Education/Training Program

## 2021-07-04 DIAGNOSIS — I10 Essential (primary) hypertension: Secondary | ICD-10-CM

## 2021-07-04 DIAGNOSIS — E785 Hyperlipidemia, unspecified: Secondary | ICD-10-CM

## 2021-07-04 DIAGNOSIS — E119 Type 2 diabetes mellitus without complications: Secondary | ICD-10-CM

## 2021-07-04 NOTE — Telephone Encounter (Signed)
Refill Request  pioglitazone (ACTOS) 30 MG tablet     [facility] [id number] - [place], [address] AT [address] (Ph: [phone])  Order Details

## 2021-07-05 MED ORDER — SIMVASTATIN 40 MG PO TABS: 40.0000 mg | ORAL_TABLET | Freq: Every day | ORAL | 3 refills | Status: DC

## 2021-07-05 MED ORDER — PIOGLITAZONE HCL 30 MG PO TABS
30.0000 mg | ORAL_TABLET | Freq: Every day | ORAL | 3 refills | Status: DC
Start: 2021-07-05 — End: 2022-07-31

## 2021-07-05 MED ORDER — CHLORTHALIDONE 25 MG PO TABS
25.0000 mg | ORAL_TABLET | Freq: Every day | ORAL | 3 refills | Status: DC
Start: 2021-07-05 — End: 2022-09-18

## 2021-07-11 ENCOUNTER — Telehealth: Payer: Self-pay | Admitting: *Deleted

## 2021-07-11 NOTE — Telephone Encounter (Signed)
Patient called in from Pharmacy upset because she doesn't have a Rx for Actos. States she has been waiting for 2 weeks. Explained all 3 meds were sent on 8/23. Pharmacist can be heard in background stating patient already p/u med. Patient is very upset, stating "I'm not crazy." She is advised to go home and look for med. States she will.  Patient called back from home stating she has the med; states she was reading the label wrong. She is very Patent attorney.

## 2021-07-13 DIAGNOSIS — N390 Urinary tract infection, site not specified: Secondary | ICD-10-CM | POA: Diagnosis not present

## 2021-08-09 DIAGNOSIS — E119 Type 2 diabetes mellitus without complications: Secondary | ICD-10-CM | POA: Diagnosis not present

## 2021-08-09 DIAGNOSIS — Z0100 Encounter for examination of eyes and vision without abnormal findings: Secondary | ICD-10-CM | POA: Diagnosis not present

## 2021-08-10 DIAGNOSIS — H2513 Age-related nuclear cataract, bilateral: Secondary | ICD-10-CM | POA: Diagnosis not present

## 2021-08-10 DIAGNOSIS — H40033 Anatomical narrow angle, bilateral: Secondary | ICD-10-CM | POA: Diagnosis not present

## 2021-08-16 ENCOUNTER — Ambulatory Visit (INDEPENDENT_AMBULATORY_CARE_PROVIDER_SITE_OTHER): Payer: Medicare Other | Admitting: *Deleted

## 2021-08-16 DIAGNOSIS — Z23 Encounter for immunization: Secondary | ICD-10-CM | POA: Diagnosis not present

## 2021-08-22 DIAGNOSIS — H25811 Combined forms of age-related cataract, right eye: Secondary | ICD-10-CM | POA: Diagnosis not present

## 2021-08-30 DIAGNOSIS — Z1231 Encounter for screening mammogram for malignant neoplasm of breast: Secondary | ICD-10-CM | POA: Diagnosis not present

## 2021-09-28 ENCOUNTER — Other Ambulatory Visit: Payer: Self-pay

## 2021-09-28 DIAGNOSIS — E785 Hyperlipidemia, unspecified: Secondary | ICD-10-CM

## 2021-09-29 MED ORDER — SIMVASTATIN 40 MG PO TABS: 40.0000 mg | ORAL_TABLET | Freq: Every day | ORAL | 3 refills | Status: DC

## 2021-11-04 DIAGNOSIS — H25811 Combined forms of age-related cataract, right eye: Secondary | ICD-10-CM | POA: Diagnosis not present

## 2022-01-23 ENCOUNTER — Ambulatory Visit (HOSPITAL_COMMUNITY)
Admission: EM | Admit: 2022-01-23 | Discharge: 2022-01-23 | Disposition: A | Discharge status: Home / Self Care | Payer: Medicare PPO | Attending: Emergency Medicine | Admitting: Emergency Medicine

## 2022-01-23 ENCOUNTER — Encounter (HOSPITAL_COMMUNITY): Payer: Self-pay

## 2022-01-23 DIAGNOSIS — M5442 Lumbago with sciatica, left side: Secondary | ICD-10-CM

## 2022-01-23 LAB — POCT URINALYSIS DIPSTICK, ED / UC
Bilirubin Urine: NEGATIVE
Glucose, UA: 100 mg/dL — AB
Hgb urine dipstick: NEGATIVE
Leukocytes,Ua: NEGATIVE
Nitrite: NEGATIVE
Protein, ur: NEGATIVE mg/dL
Specific Gravity, Urine: 1.025 (ref 1.005–1.030)
Urobilinogen, UA: 0.2 mg/dL (ref 0.0–1.0)
pH: 5.5 (ref 5.0–8.0)

## 2022-01-23 LAB — CBG MONITORING, ED: Glucose-Capillary: 156 mg/dL — ABNORMAL HIGH (ref 70–99)

## 2022-01-23 MED ORDER — PREDNISONE 10 MG (21) PO TBPK: ORAL_TABLET | ORAL | 0 refills | Status: DC

## 2022-01-23 MED ORDER — BACLOFEN 10 MG PO TABS: 10.0000 mg | ORAL_TABLET | Freq: Three times a day (TID) | ORAL | 0 refills | Status: AC

## 2022-01-23 MED ORDER — KETOROLAC TROMETHAMINE 30 MG/ML IJ SOLN
INTRAMUSCULAR | Status: AC
  Filled 2022-01-23: qty 1

## 2022-01-23 MED ORDER — KETOROLAC TROMETHAMINE 15 MG/ML IJ SOLN
15.0000 mg | Freq: Once | INTRAMUSCULAR | Status: AC
  Administered 2022-01-23: 15 mg via INTRAMUSCULAR

## 2022-01-23 MED ORDER — KETOROLAC TROMETHAMINE 15 MG/ML IJ SOLN
15.0000 mg | Freq: Once | INTRAMUSCULAR | Status: DC
Start: 2022-01-23 — End: 2022-01-23

## 2022-01-23 NOTE — ED Provider Notes (Signed)
?[facility] ? ?____________________________________________ ? ?Time seen: Approximately 2:07 PM ? ?I have reviewed the triage vital signs and the nursing notes. ? ? ?HISTORY ? ?Chief Complaint ?Back Pain ? ? ?Historian ?Patient  ? ? ? ?HPI ?[name] is a 76 y.o. female presents to the urgent care with left-sided low back pain that radiates along the posterior aspect of her left lower extremity.  She states that she has had pain for the past 2 to 3 days.  She denies bowel or bladder incontinence or saddle anesthesia.  She endorses some mild increased urinary frequency but no dysuria or hematuria.  She denies experiencing similar symptoms in the past. ? ? ?Past Medical History:  ?Diagnosis Date  ? Diabetes mellitus   ? GERD (gastroesophageal reflux disease)   ? HLD (hyperlipidemia)   ? Hypertension   ? ? ? ?Immunizations up to date:  Yes.   ? ? ?Past Medical History:  ?Diagnosis Date  ? Diabetes mellitus   ? GERD (gastroesophageal reflux disease)   ? HLD (hyperlipidemia)   ? Hypertension   ? ? ?Patient Active Problem List  ? Diagnosis Date Noted  ? Osteopenia 01/28/2018  ? Obesity (BMI 30.0-34.9) 01/28/2018  ? Routine health maintenance 03/20/2012  ? Type 2 diabetes mellitus with other specified complication ([place]) 17/61/6073  ? Dyslipidemia 09/06/2007  ? Essential hypertension 09/06/2007  ? ? ?History reviewed. No pertinent surgical history. ? ?Prior to Admission medications   ?Medication Sig Start Date End Date Taking? Authorizing Provider  ?baclofen (LIORESAL) 10 MG tablet Take 1 tablet (10 mg total) by mouth 3 (three) times daily for 5 days. 01/23/22 01/28/22 Yes Vallarie Mare M, PA-C  ?predniSONE (STERAPRED UNI-PAK 21 TAB) 10 MG (21) TBPK tablet 6,5,4,3,2,1 01/23/22  Yes Vallarie Mare M, PA-C  ?chlorthalidone (HYGROTON) 25 MG tablet Take 1 tablet (25 mg total) by mouth daily. 07/05/21   Axel Filler, MD  ?pioglitazone (ACTOS) 30 MG tablet Take 1 tablet (30 mg total) by mouth daily. 07/05/21    Axel Filler, MD  ?simvastatin (ZOCOR) 40 MG tablet Take 1 tablet (40 mg total) by mouth at bedtime. 09/29/21   Axel Filler, MD  ? ? ?Allergies ?Metformin ? ?Family History  ?Problem Relation Age of Onset  ? Stroke Neg Hx   ? Heart disease Neg Hx   ? Cancer Neg Hx   ? ? ?Social History ?Social History  ? ?Tobacco Use  ? Smoking status: Never  ? Smokeless tobacco: Never  ?Vaping Use  ? Vaping Use: Never used  ?Substance Use Topics  ? Alcohol use: No  ? Drug use: No  ? ? ? ?Review of Systems  ?Constitutional: No fever/chills ?Eyes:  No discharge ?ENT: No upper respiratory complaints. ?Respiratory: no cough. No SOB/ use of accessory muscles to breath ?Gastrointestinal:   No nausea, no vomiting.  No diarrhea.  No constipation. ?Musculoskeletal: Patient has left sided low back pain and lumbar radiculopathy.  ?Skin: Negative for rash, abrasions, lacerations, ecchymosis. ? ? ?____________________________________________ ? ? ?PHYSICAL EXAM: ? ?VITAL SIGNS: ?ED Triage Vitals  ?Enc Vitals Group  ?   BP 01/23/22 1349 (!) 155/83  ?   Pulse Rate 01/23/22 1349 73  ?   Resp 01/23/22 1349 20  ?   Temp 01/23/22 1349 98 ?F (36.7 ?C)  ?   Temp Source 01/23/22 1349 Oral  ?   SpO2 01/23/22 1349 98 %  ?   Weight --   ?   Height --   ?  Head Circumference --   ?   Peak Flow --   ?   Pain Score 01/23/22 1350 10  ?   Pain Loc --   ?   Pain Edu? --   ?   Excl. in [place]? --   ? ? ? ?Constitutional: Alert and oriented. Well appearing and in no acute distress. ?Eyes: Conjunctivae are normal. PERRL. EOMI. ?Head: Atraumatic. ?ENT: ?     Nose: No congestion/rhinnorhea. ?     Mouth/Throat: Mucous membranes are moist.  ?Neck: No stridor.  No cervical spine tenderness to palpation. ?Cardiovascular: Normal rate, regular rhythm. Normal S1 and S2.  Good peripheral circulation. ?Respiratory: Normal respiratory effort without tachypnea or retractions. Lungs CTAB. Good air entry to the bases with no decreased or absent breath  sounds ?Gastrointestinal: Bowel sounds x 4 quadrants. Soft and nontender to palpation. No guarding or rigidity. No distention. ?Musculoskeletal: Full range of motion to all extremities. No obvious deformities noted. Positive straight leg raise, left.  ?Neurologic:  Normal for age. No gross focal neurologic deficits are appreciated.  ?Skin:  Skin is warm, dry and intact. No rash noted. ?Psychiatric: Mood and affect are normal for age. Speech and behavior are normal.  ? ?____________________________________________ ?  ?LABS ?(all labs ordered are listed, but only abnormal results are displayed) ? ?Labs Reviewed  ?CBG MONITORING, ED - Abnormal; Notable for the following components:  ?    Result Value  ? Glucose-Capillary 156 (*)   ? All other components within normal limits  ?POCT URINALYSIS DIPSTICK, ED / UC - Abnormal; Notable for the following components:  ? Glucose, UA 100 (*)   ? Ketones, ur TRACE (*)   ? All other components within normal limits  ? ?____________________________________________ ? ?EKG ? ? ?____________________________________________ ? ?RADIOLOGY ? ? ?No results found. ? ?____________________________________________ ? ? ? ?PROCEDURES ? ?Procedure(s) performed:  ? ? ? ?Procedures ? ? ? ? ?Medications  ?ketorolac (TORADOL) 15 MG/ML injection 15 mg (has no administration in time range)  ? ? ? ?____________________________________________ ? ? ?INITIAL IMPRESSION / ASSESSMENT AND PLAN / ED COURSE ? ?Pertinent labs & imaging results that were available during my care of the patient were reviewed by me and considered in my medical decision making (see chart for details). ? ?  ?  ?Assessment and Plan:  ?Back pain: ?Lumbar radiculopathy:  ?76 year old female presents to the emergency department with left-sided low back pain that radiates down the posterior aspect of the lower extremity. ? ?Patient was hypertensive at triage but vital signs otherwise reassuring.  She is alert, active and nontoxic-appearing  and able to ambulate easily to and from the restroom.  Urinalysis showed no signs of UTI.  Will treat with tapered prednisone.  CBG 156.  ? ? ? ? ?____________________________________________ ? ?FINAL CLINICAL IMPRESSION(S) / ED DIAGNOSES ? ?Final diagnoses:  ?Acute left-sided low back pain with left-sided sciatica  ? ? ? ? ?NEW MEDICATIONS STARTED DURING THIS VISIT: ? ?ED Discharge Orders   ? ?      Ordered  ?  predniSONE (STERAPRED UNI-PAK 21 TAB) 10 MG (21) TBPK tablet       ? 01/23/22 1426  ?  baclofen (LIORESAL) 10 MG tablet  3 times daily       ? 01/23/22 1426  ? ?  ?  ? ?  ? ? ? ? ? ? ?This chart was dictated using voice recognition software/Dragon. Despite best efforts to proofread, errors can occur which can change the meaning.  Any change was purely unintentional. ? ? ?  ?Vallarie Mare [place], PA-C ?01/23/22 1435 ? ?
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
## 2022-01-23 NOTE — Discharge Instructions (Signed)
Take tapered steroid as directed. ?Please also take baclofen at night before bed. ?Please do not drive or operate heavy machinery with baclofen in your system. ?

## 2022-01-30 ENCOUNTER — Ambulatory Visit (INDEPENDENT_AMBULATORY_CARE_PROVIDER_SITE_OTHER): Payer: Medicare PPO | Admitting: Student in an Organized Health Care Education/Training Program

## 2022-01-30 ENCOUNTER — Encounter: Payer: Self-pay | Admitting: Student in an Organized Health Care Education/Training Program

## 2022-01-30 VITALS — BP 139/58 | HR 56 | Temp 98.5°F | Ht 63.0 in | Wt 192.9 lb

## 2022-01-30 DIAGNOSIS — I10 Essential (primary) hypertension: Secondary | ICD-10-CM | POA: Diagnosis not present

## 2022-01-30 DIAGNOSIS — E1169 Type 2 diabetes mellitus with other specified complication: Secondary | ICD-10-CM | POA: Diagnosis not present

## 2022-01-30 LAB — POCT GLYCOSYLATED HEMOGLOBIN (HGB A1C): Hemoglobin A1C: 6.9 % — AB (ref 4.0–5.6)

## 2022-01-30 LAB — GLUCOSE, CAPILLARY: Glucose-Capillary: 132 mg/dL — ABNORMAL HIGH (ref 70–99)

## 2022-01-30 NOTE — Assessment & Plan Note (Signed)
Stable, A1c well controlled at 6.9%.  Is on a historic regimen of pioglitazone 30 mg daily, could not tolerate metformin.  For now I think it is okay to continue with the pioglitazone, certainly if she has worsening control or diabetic complications with switch to an SGLT2 inhibitor.  We will check BMP today.  She was unable to provide a urine microalbumin but it was normal last year.  Sees Dr. Katy Fitch regularly for eye exams. ?
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
## 2022-01-30 NOTE — Assessment & Plan Note (Signed)
Blood pressure well controlled today.  Plan to continue chlorthalidone 25 mg daily.  We will check BMP today. ?

## 2022-01-31 ENCOUNTER — Encounter: Payer: Self-pay | Admitting: Student in an Organized Health Care Education/Training Program

## 2022-01-31 LAB — BMP8+ANION GAP
Anion Gap: 20 mmol/L — ABNORMAL HIGH (ref 10.0–18.0)
BUN/Creatinine Ratio: 20 (ref 12–28)
BUN: 21 mg/dL (ref 8–27)
CO2: 20 mmol/L (ref 20–29)
Calcium: 9.8 mg/dL (ref 8.7–10.3)
Chloride: 98 mmol/L (ref 96–106)
Creatinine, Ser: 1.07 mg/dL — ABNORMAL HIGH (ref 0.57–1.00)
Glucose: 113 mg/dL — ABNORMAL HIGH (ref 70–99)
Potassium: 4 mmol/L (ref 3.5–5.2)
Sodium: 138 mmol/L (ref 134–144)
eGFR: 54 mL/min/{1.73_m2} — ABNORMAL LOW (ref >59)

## 2022-06-07 ENCOUNTER — Emergency Department (HOSPITAL_COMMUNITY): Payer: Medicare PPO

## 2022-06-07 ENCOUNTER — Encounter (HOSPITAL_COMMUNITY): Payer: Self-pay | Admitting: Emergency Medicine

## 2022-06-07 ENCOUNTER — Emergency Department (HOSPITAL_COMMUNITY)
Admission: EM | Admit: 2022-06-07 | Discharge: 2022-06-07 | Discharge status: Against Medical Advice | Payer: Medicare PPO | Attending: Emergency Medicine | Admitting: Emergency Medicine

## 2022-06-07 ENCOUNTER — Other Ambulatory Visit: Payer: Self-pay

## 2022-06-07 ENCOUNTER — Emergency Department (HOSPITAL_COMMUNITY)
Admission: EM | Admit: 2022-06-07 | Discharge: 2022-06-07 | Disposition: A | Discharge status: Home / Self Care | Payer: Medicare PPO | Source: Home / Self Care | Attending: Emergency Medicine | Admitting: Emergency Medicine

## 2022-06-07 ENCOUNTER — Encounter (HOSPITAL_COMMUNITY): Payer: Self-pay

## 2022-06-07 DIAGNOSIS — E876 Hypokalemia: Secondary | ICD-10-CM | POA: Insufficient documentation

## 2022-06-07 DIAGNOSIS — R1030 Lower abdominal pain, unspecified: Secondary | ICD-10-CM | POA: Insufficient documentation

## 2022-06-07 DIAGNOSIS — K5792 Diverticulitis of intestine, part unspecified, without perforation or abscess without bleeding: Secondary | ICD-10-CM | POA: Insufficient documentation

## 2022-06-07 DIAGNOSIS — R739 Hyperglycemia, unspecified: Secondary | ICD-10-CM | POA: Insufficient documentation

## 2022-06-07 DIAGNOSIS — Z5321 Procedure and treatment not carried out due to patient leaving prior to being seen by health care provider: Secondary | ICD-10-CM | POA: Insufficient documentation

## 2022-06-07 LAB — COMPREHENSIVE METABOLIC PANEL
ALT: 16 U/L (ref 0–44)
ALT: 16 U/L (ref 0–44)
AST: 22 U/L (ref 15–41)
AST: 22 U/L (ref 15–41)
Albumin: 3.9 g/dL (ref 3.5–5.0)
Albumin: 4 g/dL (ref 3.5–5.0)
Alkaline Phosphatase: 65 U/L (ref 38–126)
Alkaline Phosphatase: 74 U/L (ref 38–126)
Anion gap: 11 (ref 5–15)
Anion gap: 12 (ref 5–15)
BUN: 24 mg/dL — ABNORMAL HIGH (ref 8–23)
BUN: 20 mg/dL (ref 8–23)
CO2: 27 mmol/L (ref 22–32)
CO2: 25 mmol/L (ref 22–32)
Calcium: 9.8 mg/dL (ref 8.9–10.3)
Calcium: 9.6 mg/dL (ref 8.9–10.3)
Chloride: 102 mmol/L (ref 98–111)
Chloride: 99 mmol/L (ref 98–111)
Creatinine, Ser: 1.05 mg/dL — ABNORMAL HIGH (ref 0.44–1.00)
Creatinine, Ser: 1.2 mg/dL — ABNORMAL HIGH (ref 0.44–1.00)
GFR, Estimated: 55 mL/min — ABNORMAL LOW (ref >60)
GFR, Estimated: 47 mL/min — ABNORMAL LOW (ref >60)
Glucose, Bld: 159 mg/dL — ABNORMAL HIGH (ref 70–99)
Glucose, Bld: 210 mg/dL — ABNORMAL HIGH (ref 70–99)
Potassium: 3 mmol/L — ABNORMAL LOW (ref 3.5–5.1)
Potassium: 3.3 mmol/L — ABNORMAL LOW (ref 3.5–5.1)
Sodium: 140 mmol/L (ref 135–145)
Sodium: 136 mmol/L (ref 135–145)
Total Bilirubin: 0.9 mg/dL (ref 0.3–1.2)
Total Bilirubin: 1.1 mg/dL (ref 0.3–1.2)
Total Protein: 8 g/dL (ref 6.5–8.1)
Total Protein: 7.8 g/dL (ref 6.5–8.1)

## 2022-06-07 LAB — CBC WITH DIFFERENTIAL/PLATELET
Abs Immature Granulocytes: 0.03 10*3/uL (ref 0.00–0.07)
Basophils Absolute: 0 10*3/uL (ref 0.0–0.1)
Basophils Relative: 0 %
Eosinophils Absolute: 0 10*3/uL (ref 0.0–0.5)
Eosinophils Relative: 0 %
HCT: 37.5 % (ref 36.0–46.0)
Hemoglobin: 12.2 g/dL (ref 12.0–15.0)
Immature Granulocytes: 0 %
Lymphocytes Relative: 10 %
Lymphs Abs: 0.8 10*3/uL (ref 0.7–4.0)
MCH: 31.4 pg (ref 26.0–34.0)
MCHC: 32.5 g/dL (ref 30.0–36.0)
MCV: 96.6 fL (ref 80.0–100.0)
Monocytes Absolute: 0.5 10*3/uL (ref 0.1–1.0)
Monocytes Relative: 6 %
Neutro Abs: 7.2 10*3/uL (ref 1.7–7.7)
Neutrophils Relative %: 84 %
Platelets: 187 10*3/uL (ref 150–400)
RBC: 3.88 MIL/uL (ref 3.87–5.11)
RDW: 14.5 % (ref 11.5–15.5)
WBC: 8.5 10*3/uL (ref 4.0–10.5)
nRBC: 0 % (ref 0.0–0.2)

## 2022-06-07 LAB — CBC
HCT: 40.4 % (ref 36.0–46.0)
Hemoglobin: 12.8 g/dL (ref 12.0–15.0)
MCH: 31.4 pg (ref 26.0–34.0)
MCHC: 31.7 g/dL (ref 30.0–36.0)
MCV: 99 fL (ref 80.0–100.0)
Platelets: 189 10*3/uL (ref 150–400)
RBC: 4.08 MIL/uL (ref 3.87–5.11)
RDW: 14.4 % (ref 11.5–15.5)
WBC: 10.1 10*3/uL (ref 4.0–10.5)
nRBC: 0 % (ref 0.0–0.2)

## 2022-06-07 LAB — LIPASE, BLOOD
Lipase: 20 U/L (ref 11–51)
Lipase: 20 U/L (ref 11–51)

## 2022-06-07 MED ORDER — IOHEXOL 300 MG/ML  SOLN
80.0000 mL | Freq: Once | INTRAMUSCULAR | Status: AC | PRN
  Administered 2022-06-07: 80 mL via INTRAVENOUS

## 2022-06-07 MED ORDER — AMOXICILLIN-POT CLAVULANATE 875-125 MG PO TABS
1.0000 | ORAL_TABLET | Freq: Once | ORAL | Status: AC
  Administered 2022-06-07: 1 via ORAL
  Filled 2022-06-07: qty 1

## 2022-06-07 MED ORDER — ONDANSETRON 4 MG PO TBDP: 4.0000 mg | ORAL_TABLET | Freq: Three times a day (TID) | ORAL | 0 refills | Status: DC | PRN

## 2022-06-07 MED ORDER — MORPHINE SULFATE (PF) 2 MG/ML IV SOLN
2.0000 mg | Freq: Once | INTRAVENOUS | Status: AC
  Administered 2022-06-07: 2 mg via INTRAVENOUS
  Filled 2022-06-07: qty 1

## 2022-06-07 MED ORDER — AMOXICILLIN-POT CLAVULANATE 875-125 MG PO TABS: 1.0000 | ORAL_TABLET | Freq: Two times a day (BID) | ORAL | 0 refills | Status: DC

## 2022-06-07 MED ORDER — HYDROCODONE-ACETAMINOPHEN 5-325 MG PO TABS: 1.0000 | ORAL_TABLET | ORAL | 0 refills | Status: DC | PRN

## 2022-06-07 NOTE — ED Provider Triage Note (Signed)
Emergency Medicine Provider Triage Evaluation Note  STEFANNIE DEFEO , a 76 y.o. female  was evaluated in triage.  Pt complains of low abdominal pain.  States that same began yesterday morning and has been persistent since then.  That was yesterday and was normal.  Denies any history of abdominal surgeries.  Went to [facility] earlier today and had overall unremarkable lab work but left without being seen due to wait times.  Review of Systems  Positive:  Negative:  Physical Exam  BP (!) 155/67 (BP Location: Right Arm)   Pulse 72   Temp 98.8 F (37.1 C) (Oral)   Resp 17   LMP 04/10/1996   SpO2 99%  Gen:   Awake, no distress   Resp:  Normal effort  MSK:   Moves extremities without difficulty  Other:    Medical Decision Making  Medically screening exam initiated at 2:36 PM.  Appropriate orders placed.  CLOVER FEEHAN was informed that the remainder of the evaluation will be completed by another provider, this initial triage assessment does not replace that evaluation, and the importance of remaining in the ED until their evaluation is complete.  Work-up initiated   Nestor Lewandowsky 06/07/22 1438

## 2022-06-07 NOTE — ED Triage Notes (Signed)
Pt reports with lower abdominal pain that started yesterday morning.

## 2022-06-07 NOTE — ED Notes (Signed)
Patient verbalizes understanding of discharge instructions. Opportunity for questioning and answers were provided. Armband removed by staff, pt discharged from ED. Pt ambulatory to ED waiting room. 

## 2022-06-07 NOTE — ED Provider Notes (Signed)
[facility] EMERGENCY DEPARTMENT Provider Note   CSN: [id number] Arrival date & time: 06/07/22  1331     History  Chief Complaint  Patient presents with   Abdominal Pain    [name] is a 76 y.o. female.  The history is provided by the patient. No language interpreter was used.  Abdominal Pain Pain location:  Generalized Pain quality: aching   Pain radiates to:  Does not radiate Pain severity:  Moderate Onset quality:  Gradual Duration:  1 day Timing:  Constant Progression:  Worsening Chronicity:  New Context: not recent illness   Relieved by:  Nothing Worsened by:  Nothing Ineffective treatments:  None tried Associated symptoms: diarrhea and nausea   Associated symptoms: no fever   Risk factors: no alcohol abuse        Home Medications Prior to Admission medications   Medication Sig Start Date End Date Taking? Authorizing Provider  amoxicillin-clavulanate (AUGMENTIN) 875-125 MG tablet Take 1 tablet by mouth 2 (two) times daily. 06/07/22  Yes Fransico Meadow, PA-C  HYDROcodone-acetaminophen (NORCO/VICODIN) 5-325 MG tablet Take 1 tablet by mouth every 4 (four) hours as needed for moderate pain. 06/07/22 06/07/23 Yes Caryl Ada K, PA-C  ondansetron (ZOFRAN-ODT) 4 MG disintegrating tablet Take 1 tablet (4 mg total) by mouth every 8 (eight) hours as needed for nausea or vomiting. 06/07/22  Yes Fransico Meadow, PA-C  chlorthalidone (HYGROTON) 25 MG tablet Take 1 tablet (25 mg total) by mouth daily. 07/05/21   Axel Filler, MD  pioglitazone (ACTOS) 30 MG tablet Take 1 tablet (30 mg total) by mouth daily. 07/05/21   Axel Filler, MD  simvastatin (ZOCOR) 40 MG tablet Take 1 tablet (40 mg total) by mouth at bedtime. 09/29/21   Axel Filler, MD      Allergies    Metformin    Review of Systems   Review of Systems  Constitutional:  Negative for fever.  Gastrointestinal:  Positive for abdominal pain, diarrhea and nausea.   All other systems reviewed and are negative.   Physical Exam Updated Vital Signs BP (!) 146/66   Pulse 66   Temp 99.5 F (37.5 C) (Oral)   Resp 15   LMP 04/10/1996   SpO2 97%  Physical Exam Vitals and nursing note reviewed.  Constitutional:      Appearance: She is well-developed.  HENT:     Head: Normocephalic.     Mouth/Throat:     Mouth: Mucous membranes are moist.  Eyes:     Extraocular Movements: Extraocular movements intact.  Cardiovascular:     Rate and Rhythm: Normal rate and regular rhythm.  Pulmonary:     Effort: Pulmonary effort is normal.  Abdominal:     General: Abdomen is flat. Bowel sounds are normal. There is no distension.     Palpations: Abdomen is soft.     Tenderness: There is abdominal tenderness in the left lower quadrant.  Musculoskeletal:        General: Normal range of motion.     Cervical back: Normal range of motion.  Skin:    General: Skin is warm.  Neurological:     Mental Status: She is alert and oriented to person, place, and time.  Psychiatric:        Mood and Affect: Mood normal.     ED Results / Procedures / Treatments   Labs (all labs ordered are listed, but only abnormal results are displayed) Labs Reviewed  COMPREHENSIVE METABOLIC PANEL -  Abnormal; Notable for the following components:      Result Value   Potassium 3.3 (*)    Glucose, Bld 210 (*)    Creatinine, Ser 1.20 (*)    GFR, Estimated 47 (*)    All other components within normal limits  LIPASE, BLOOD  CBC  URINALYSIS, ROUTINE W REFLEX MICROSCOPIC    EKG None  Radiology CT ABDOMEN PELVIS W CONTRAST  Result Date: 06/07/2022 CLINICAL DATA:  Abdominal pain EXAM: CT ABDOMEN AND PELVIS WITH CONTRAST TECHNIQUE: Multidetector CT imaging of the abdomen and pelvis was performed using the standard protocol following bolus administration of intravenous contrast. RADIATION DOSE REDUCTION: This exam was performed according to the departmental dose-optimization program  which includes automated exposure control, adjustment of the mA and/or kV according to patient size and/or use of iterative reconstruction technique. CONTRAST:  35m OMNIPAQUE IOHEXOL 300 MG/ML  SOLN COMPARISON:  08/14/2016 FINDINGS: Lower chest: Unremarkable. Hepatobiliary: No focal abnormalities are seen. Gallbladder is unremarkable. Pancreas: There is atrophy.  No focal abnormalities are seen. Spleen: Unremarkable. Adrenals/Urinary Tract: There is mild nodularity in the lateral limb of left adrenal which has not changed. There is no hydronephrosis. There are no renal or ureteral stones. There is a 12 mm smoothly marginated low-density lesion in the upper pole of right kidney suggesting renal cysts. Urinary bladder is unremarkable. Stomach/Bowel: Stomach is unremarkable. Small bowel loops are not dilated. The appendix is not dilated. Scattered diverticula are seen in colon. There is mild wall thickening and pericolic stranding in sigmoid colon in mid pelvis. There is minimal amount of free fluid in left side of pelvis. There is no loculated pericolic abscess. Vascular/Lymphatic: Scattered arterial calcifications are seen. No new significant lymphadenopathy is seen. Reproductive: Unremarkable. Other: There is no ascites or pneumoperitoneum. Musculoskeletal: There is mild 10-15% narrowing of upper endplate of body of L5 vertebra without break in the cortical margins. Alignment of posterior margin of the vertebral bodies is within normal limits. Paraspinal soft tissues are unremarkable. IMPRESSION: Diverticulosis of colon. There is wall thickening and mild pericolic stranding in sigmoid in midline in pelvis suggesting acute sigmoid diverticulitis. There is no loculated pericolic abscess. There is no evidence of intestinal obstruction or pneumoperitoneum. There is no hydronephrosis. Appendix is not dilated. There is interval mild 10-15% decrease in height of upper endplate of body of L5 vertebra without radiolucent  fracture line. Findings may suggest old mild compression fracture. Electronically Signed   By: PElmer PickerM.D.   On: 06/07/2022 17:05    Procedures Procedures    Medications Ordered in ED Medications  iohexol (OMNIPAQUE) 300 MG/ML solution 80 mL (80 mLs Intravenous Contrast Given 06/07/22 1649)  morphine (PF) 2 MG/ML injection 2 mg (2 mg Intravenous Given 06/07/22 1830)  amoxicillin-clavulanate (AUGMENTIN) 875-125 MG per tablet 1 tablet (1 tablet Oral Given 06/07/22 1830)    ED Course/ Medical Decision Making/ A&P                           Medical Decision Making Pt complains of left lower abdominal pain since yesterday.  Pt reports pain increasing in severity .  Amount and/or Complexity of Data Reviewed Independent Historian:     Details: Pt's daughter is here with her. External Data Reviewed: notes.    Details: primary care notes reviewed Labs: ordered. Decision-making details documented in ED Course.    Details: labs ordered reviewed and interpreted.  potassium is 3.3 glucose is 210 Radiology: ordered and  independent interpretation performed. Decision-making details documented in ED Course.    Details: Ct scan shows diverticulitis  Risk Prescription drug management. Risk Details: Given IV fluids and morphine 2 mg IV for pain patient reports relief with morphine patient is able to tolerate oral fluids she is given Augmentin 875 mg p.o. Counseled patient on the results and treatment of diverticulitis patient is advised to schedule to see her primary care physician next week she is given a note to be out of work for the next 5 days.  Patient is given a prescription for Augmentin and Zofran and hydrocodone.  Patient lives with family she states that someone will be with her if the hydrocodone sedates her.           Final Clinical Impression(s) / ED Diagnoses Final diagnoses:  Diverticulitis    Rx / DC Orders ED Discharge Orders          Ordered     amoxicillin-clavulanate (AUGMENTIN) 875-125 MG tablet  2 times daily        06/07/22 1904    HYDROcodone-acetaminophen (NORCO/VICODIN) 5-325 MG tablet  Every 4 hours PRN        06/07/22 1904    ondansetron (ZOFRAN-ODT) 4 MG disintegrating tablet  Every 8 hours PRN        06/07/22 1905           An After Visit Summary was printed and given to the patient.    Sidney Ace 06/07/22 2028    Gareth Morgan, MD 06/08/22 [phone]

## 2022-06-07 NOTE — ED Notes (Signed)
Pt encouraged to provide U/A to complete lab orders soon as possible; request assistance from staff for further questions/assistance. [employer]

## 2022-06-07 NOTE — Discharge Instructions (Addendum)
See your Physician for recheck in 1 week  °

## 2022-06-07 NOTE — ED Notes (Signed)
Labeled specimen cup given to pt for urine collection per MD order. EN[place] 

## 2022-06-07 NOTE — ED Triage Notes (Signed)
Patient here with compliant of lower abdominal pain that started yesterday. Denies diarrhea, patient is alert, oriented, ambulatory, and in no apparent distress at this time.

## 2022-06-19 ENCOUNTER — Encounter: Payer: Self-pay | Admitting: Student in an Organized Health Care Education/Training Program

## 2022-06-19 ENCOUNTER — Ambulatory Visit (INDEPENDENT_AMBULATORY_CARE_PROVIDER_SITE_OTHER): Payer: Medicare PPO | Admitting: Student in an Organized Health Care Education/Training Program

## 2022-06-19 VITALS — BP 125/56 | HR 61 | Temp 98.5°F | Ht 65.0 in | Wt 189.3 lb

## 2022-06-19 DIAGNOSIS — E1169 Type 2 diabetes mellitus with other specified complication: Secondary | ICD-10-CM | POA: Diagnosis not present

## 2022-06-19 DIAGNOSIS — I10 Essential (primary) hypertension: Secondary | ICD-10-CM

## 2022-06-19 DIAGNOSIS — K5790 Diverticulosis of intestine, part unspecified, without perforation or abscess without bleeding: Secondary | ICD-10-CM | POA: Diagnosis not present

## 2022-06-19 LAB — POCT GLYCOSYLATED HEMOGLOBIN (HGB A1C): Hemoglobin A1C: 7.1 % — AB (ref 4.0–5.6)

## 2022-06-19 LAB — GLUCOSE, CAPILLARY: Glucose-Capillary: 145 mg/dL — ABNORMAL HIGH (ref 70–99)

## 2022-06-19 NOTE — Assessment & Plan Note (Signed)
Recently had first occurrence of acute diverticulitis confirmed on CT scan in the ED. She has recovered well after course of antibiotics. We discussed natural course of this condition. She will call us early if she develops similar symptoms in the future. No indication for surgery referral at this time. Last screening colonoscopy was ok in 2020, next due in 2027.

## 2022-06-19 NOTE — Assessment & Plan Note (Addendum)
BP well controlled today on chlorthalidone for many years which she is tolerating well. Labs ok in July, possibly mild reduction in GFR at 58. May need to switch to losartan in the future.

## 2022-06-19 NOTE — Assessment & Plan Note (Addendum)
Well controlled today with A1c of 7.1% on a historic regimen of pioglitazone '30mg'$  daily alone. Ok to continue this for now. Will try to check urine microalbumin today, would switch to Jardiance if she has proteinuria. Will continue with simvastatin for primary prevention. Foot exam normal today. A little behind on retinopathy screening, she is going to call Dr. Zenia Resides office.

## 2022-06-19 NOTE — Progress Notes (Signed)
   Established Patient Office Visit  Subjective   Patient ID: [name], female    DOB: 12-24-1945  Age: 76 y.o. MRN: [id number]  Chief Complaint  Patient presents with   Medical Management of Chronic Issues    HPI  76 year old person her for follow up of diabetes and hypertension. She had an episode of acute lower abdominal pain a few weeks ago, diagnosed with acute diverticulitis in the ED with CT scan, mostly sigmoid inflammation. She was treated with Augmentin and has had resolution of pain. Some residual loose stools and small blood when wiping. No nausea or vomiting. Good adherence with other medications. Good functional status, totally independent at home, good exertional capacity. No chest pain or breathing issues.    Objective:     BP (!) 125/56 (BP Location: Left Arm, Patient Position: Sitting, Cuff Size: Normal)   Pulse 61   Temp 98.5 F (36.9 C) (Oral)   Ht '5\' 5"'$  (1.651 m)   Wt 189 lb 4.8 oz (85.9 kg)   LMP 04/10/1996   SpO2 99%   BMI 31.50 kg/m    Physical Exam  Gen: well appearing, no distress Neck: no thyromegaly or adenopathy CV: RRR, no murmurs Lungs: unlabored, clear throughout Abd: soft, non tender Ext: Warm, no edema Psych: mildly blunted affect, not depressed or anxious appearing.    Assessment & Plan:   Problem List Items Addressed This Visit       High   Type 2 diabetes mellitus with other specified complication ([place]) - Primary (Chronic)    Well controlled today with A1c of 7.1% on a historic regimen of pioglitazone '30mg'$  daily alone. Ok to continue this for now. Will try to check urine microalbumin today, would switch to Jardiance if she has proteinuria. Will continue with simvastatin for primary prevention. Foot exam normal today. A little behind on retinopathy screening, she is going to call Dr. Zenia Resides office.      Relevant Orders   POC Hbg A1C (Completed)   Microalbumin / Creatinine Urine Ratio   Essential hypertension (Chronic)     BP well controlled today on chlorthalidone for many years which she is tolerating well. Labs ok in July, possibly mild reduction in GFR at 58. May need to switch to losartan in the future.         Unprioritized   Diverticulosis    Recently had first occurrence of acute diverticulitis confirmed on CT scan in the ED. She has recovered well after course of antibiotics. We discussed natural course of this condition. She will call us early if she develops similar symptoms in the future. No indication for surgery referral at this time. Last screening colonoscopy was ok in 2020, next due in 2027.       Return in about 3 months (around 09/19/2022).    Axel Filler, MD

## 2022-06-20 LAB — MICROALBUMIN / CREATININE URINE RATIO
Creatinine, Urine: 143.9 mg/dL
Microalb/Creat Ratio: 5 mg/g creat (ref 0–29)
Microalbumin, Urine: 7.1 ug/mL

## 2022-06-21 ENCOUNTER — Encounter: Payer: Self-pay | Admitting: Student in an Organized Health Care Education/Training Program

## 2022-07-20 ENCOUNTER — Telehealth: Payer: Self-pay | Admitting: Gastroenterology

## 2022-07-20 NOTE — Telephone Encounter (Signed)
Returned call to patient. First symptoms (chills) began last week; felt "real sick" by Sunday. Went to UC on Tuesday and tested positive for Covid. Was placed on Paxlovid 3 tabs BID. States this is "too strong for me." Only taking 2 tabs BID, but doesn't want to take them anymore. States they make her feel weak and dizzy. Main c/o are headache and chest congestion. Also, aching all over. Took 2 full strength ASA on Monday with minimal relief. States she's afraid to take anything and doesn't like taking pills, but needs something "to clear up cold in my head and chest." Discussed OTC Mucinex and Tylenol or ibuprofen. States she's afraid and would like to know what PCP recommends. She was advised at [facility] of need for quarantining and drinking plenty of fluids. States she has been doing both.

## 2022-07-20 NOTE — Telephone Encounter (Signed)
Returned call to patient. She was advised to get OTC Mucinex, take either Tylenol or Ibuprofen for pain, and continue hydrating well. She is very [employer] attorney.

## 2022-07-20 NOTE — Telephone Encounter (Signed)
I agree with what you advised about the OTC medications.

## 2022-07-20 NOTE — Telephone Encounter (Signed)
Pt reporting she test positive for Covid on this past Tuesday.  Pt states she was given some medication that is not working and is requesting something else.  Pt states she is having Head Pain and Chest congestion.  Pt wants to know what else she can take.

## 2022-07-31 ENCOUNTER — Other Ambulatory Visit: Payer: Self-pay

## 2022-07-31 DIAGNOSIS — E119 Type 2 diabetes mellitus without complications: Secondary | ICD-10-CM

## 2022-07-31 MED ORDER — PIOGLITAZONE HCL 30 MG PO TABS: 30.0000 mg | ORAL_TABLET | Freq: Every day | ORAL | 3 refills | Status: DC

## 2022-07-31 NOTE — Telephone Encounter (Signed)
pioglitazone (ACTOS) 30 MG tablet, REFILL REQUEST @ [employer] [place] [address]

## 2022-08-08 ENCOUNTER — Ambulatory Visit (INDEPENDENT_AMBULATORY_CARE_PROVIDER_SITE_OTHER): Payer: Medicare PPO

## 2022-08-08 DIAGNOSIS — Z23 Encounter for immunization: Secondary | ICD-10-CM | POA: Diagnosis not present

## 2022-09-18 ENCOUNTER — Ambulatory Visit (INDEPENDENT_AMBULATORY_CARE_PROVIDER_SITE_OTHER): Payer: Medicare PPO | Admitting: Student in an Organized Health Care Education/Training Program

## 2022-09-18 ENCOUNTER — Encounter: Payer: Self-pay | Admitting: Student in an Organized Health Care Education/Training Program

## 2022-09-18 DIAGNOSIS — E1169 Type 2 diabetes mellitus with other specified complication: Secondary | ICD-10-CM | POA: Diagnosis not present

## 2022-09-18 DIAGNOSIS — Z0000 Encounter for general adult medical examination without abnormal findings: Secondary | ICD-10-CM

## 2022-09-18 DIAGNOSIS — I10 Essential (primary) hypertension: Secondary | ICD-10-CM

## 2022-09-18 MED ORDER — CHLORTHALIDONE 25 MG PO TABS: 25.0000 mg | ORAL_TABLET | Freq: Every day | ORAL | 3 refills | Status: DC

## 2022-09-18 NOTE — Assessment & Plan Note (Addendum)
Blood pressures have been well controlled in office.  We will continue with chlorthalidone.  BMP was okay in July, will check at next visit

## 2022-09-18 NOTE — Progress Notes (Signed)
  [facility] Telephone Encounter Continuity Care Appointment  HPI:  This telephone encounter was created for [name] on 09/18/2022 for the following purpose/cc follow-up of diabetes.  76 year old person here for management of diabetes and hypertension.  Originally scheduled for an office visit but transition to telephone visits.  Patient doing well at home with no concerns.  Taking medications without side effects.  Does not check blood sugars or blood pressure at home.  Exercising without issues.  Still works a Architect.  No recent falls at home.  No chest pain or pressure.  No fevers or chills.  No ED visits or other acute illnesses.    Past Medical History:  Past Medical History:  Diagnosis Date   Diabetes mellitus    GERD (gastroesophageal reflux disease)    HLD (hyperlipidemia)    Hypertension      ROS:  No fevers or chills   Assessment / Plan / Recommendations:  Please see A&P under problem oriented charting for assessment of the patient's acute and chronic medical conditions.  As always, pt is advised that if symptoms worsen or new symptoms arise, they should go to an urgent care facility or to to ER for further evaluation.   Consent and Medical Decision Making:   This is a telephone encounter between Justin Mend and Axel Filler on 09/18/2022 for diabetes. The visit was conducted with the patient located at home and Axel Filler at [facility]. The patient's identity was confirmed using their DOB and current address. The patient has consented to being evaluated through a telephone encounter and understands the associated risks (an examination cannot be done and the patient may need to come in for an appointment) / benefits (allows the patient to remain at home, decreasing exposure to coronavirus). I personally spent 5 minutes on medical discussion.

## 2022-09-18 NOTE — Assessment & Plan Note (Addendum)
Reports getting a mammogram on Saturday, do not have the results of that yet.  Further breast cancer screening is optional based on her age.  Came in for flu vaccine late September.  She completed colon cancer screening.

## 2022-09-18 NOTE — Assessment & Plan Note (Signed)
Doing well on pioglitazone monotherapy with last A1c 7.1%.  Has been very well controlled.  We will plan to recheck A1c in about 3 months.  Probably okay to check A1c every 4-6 months at this point.  Last urine microalbumin was negative.

## 2022-11-14 DIAGNOSIS — S29012A Strain of muscle and tendon of back wall of thorax, initial encounter: Secondary | ICD-10-CM | POA: Diagnosis not present

## 2022-11-17 ENCOUNTER — Other Ambulatory Visit: Payer: Self-pay

## 2022-11-17 ENCOUNTER — Emergency Department (HOSPITAL_COMMUNITY): Payer: Medicare HMO

## 2022-11-17 ENCOUNTER — Emergency Department (HOSPITAL_COMMUNITY)
Admission: EM | Admit: 2022-11-17 | Discharge: 2022-11-17 | Disposition: A | Discharge status: Home / Self Care | Payer: Medicare HMO | Attending: Emergency Medicine | Admitting: Emergency Medicine

## 2022-11-17 ENCOUNTER — Encounter (HOSPITAL_COMMUNITY): Payer: Self-pay

## 2022-11-17 DIAGNOSIS — M25511 Pain in right shoulder: Secondary | ICD-10-CM | POA: Diagnosis not present

## 2022-11-17 DIAGNOSIS — S46911A Strain of unspecified muscle, fascia and tendon at shoulder and upper arm level, right arm, initial encounter: Secondary | ICD-10-CM | POA: Diagnosis not present

## 2022-11-17 DIAGNOSIS — I10 Essential (primary) hypertension: Secondary | ICD-10-CM | POA: Diagnosis not present

## 2022-11-17 DIAGNOSIS — S46811A Strain of other muscles, fascia and tendons at shoulder and upper arm level, right arm, initial encounter: Secondary | ICD-10-CM

## 2022-11-17 MED ORDER — KETOROLAC TROMETHAMINE 15 MG/ML IJ SOLN
15.0000 mg | Freq: Once | INTRAMUSCULAR | Status: AC
  Administered 2022-11-17: 15 mg via INTRAVENOUS
  Filled 2022-11-17: qty 1

## 2022-11-17 MED ORDER — ONDANSETRON 4 MG PO TBDP
4.0000 mg | ORAL_TABLET | Freq: Once | ORAL | Status: AC
  Administered 2022-11-17: 4 mg via ORAL
  Filled 2022-11-17: qty 1

## 2022-11-17 MED ORDER — HYDROCODONE-ACETAMINOPHEN 5-325 MG PO TABS
1.0000 | ORAL_TABLET | Freq: Once | ORAL | Status: AC
  Administered 2022-11-17: 1 via ORAL
  Filled 2022-11-17: qty 1

## 2022-11-17 NOTE — ED Triage Notes (Signed)
Pt. Stated, I'm having right shoulder pain and right side pain since Tuesday. Denies any injury

## 2022-11-17 NOTE — Discharge Instructions (Signed)
Use the gel as prescribed. Also take tylenol '1000mg'$ (2 extra strength) four times a day.  I have given you a sling for comfort.  You do need to take your arm out of the sling at least 4 times a day and move around.  Try to keep an eye on what you are doing at home.  Continued motions that aggravate the injury could cause her to have ongoing discomfort.  Please call your family doctor to schedule an appointment.  If you are having ongoing discomfort they may choose to do further imaging or may have you see physical therapy.

## 2022-11-17 NOTE — ED Provider Triage Note (Signed)
Emergency Medicine Provider Triage Evaluation Note  [name] , a 77 y.o. female  was evaluated in triage.  Pt complains of worsening right shoulder pain over the last week.  Denies preceding injury.  Worse with extensive range of motion.  Pain localized over the scapular region.  Denies numbness, tingling, or weakness of the extremity.  Denies chest pain, shortness of breath, dizziness, lightheadedness.  Hx of DMT2 and osteopenia.  States this feels similar to when she had shingles over the area in the past.  Review of Systems  Positive:  Negative: See above  Physical Exam  BP 133/70 (BP Location: Left Arm)   Pulse 67   Temp 98.5 F (36.9 C) (Oral)   Resp (!) 24   Ht '5\' 5"'$  (1.651 m)   Wt 83.9 kg   LMP 04/10/1996   SpO2 100%   BMI 30.79 kg/m  Gen:   Awake, no distress   Resp:  Normal effort  MSK:   Moves extremities without difficulty  Other:  Uncomfortable appearing.  Tenderness over the right scapular region.  No superficial rash, erythema, or vesicular skin changes.  No bony deformity or bony tenderness appreciated.  Radial pulses 2+ bilaterally.  CRT less than 2.  Sensation and strength appear grossly intact of upper extremities.  Medical Decision Making  Medically screening exam initiated at 11:22 AM.  Appropriate orders placed.  MARYL BLALOCK was informed that the remainder of the evaluation will be completed by another provider, this initial triage assessment does not replace that evaluation, and the importance of remaining in the ED until their evaluation is complete.     Prince Rome, PA-C 86/48/47 1142

## 2022-11-17 NOTE — ED Provider Notes (Signed)
[facility] EMERGENCY DEPARTMENT Provider Note   CSN: [id number] Arrival date & time: 11/17/22  1041     History  Chief Complaint  Patient presents with   Shoulder Pain    [name] is a 77 y.o. female.  77 yo F with a cc of right shoulder pain.  Started in the trapezius and radiates down the arm.  Has been coming and going.  She tells me it is really severe at times.  She has been lifting some things at work that is different from her normal job.  She tells me is not very heavy.  She denies exertional symptoms.  Denies numbness or weakness of the arm.   Shoulder Pain      Home Medications Prior to Admission medications   Medication Sig Start Date End Date Taking? Authorizing Provider  chlorthalidone (HYGROTON) 25 MG tablet Take 1 tablet (25 mg total) by mouth daily. 09/18/22   Axel Filler, MD  pioglitazone (ACTOS) 30 MG tablet Take 1 tablet (30 mg total) by mouth daily. 07/31/22   Axel Filler, MD  simvastatin (ZOCOR) 40 MG tablet Take 1 tablet (40 mg total) by mouth at bedtime. 09/29/21   Axel Filler, MD      Allergies    Metformin    Review of Systems   Review of Systems  Physical Exam Updated Vital Signs BP (!) 129/53   Pulse (!) 18   Temp 98.5 F (36.9 C)   Resp 15   Ht '5\' 5"'$  (1.651 m)   Wt 83.9 kg   LMP 04/10/1996   SpO2 99%   BMI 30.79 kg/m  Physical Exam Vitals and nursing note reviewed.  Constitutional:      General: She is not in acute distress.    Appearance: She is well-developed. She is not diaphoretic.  HENT:     Head: Normocephalic and atraumatic.  Eyes:     Pupils: Pupils are equal, round, and reactive to light.  Cardiovascular:     Rate and Rhythm: Normal rate and regular rhythm.     Heart sounds: No murmur heard.    No friction rub. No gallop.  Pulmonary:     Effort: Pulmonary effort is normal.     Breath sounds: No wheezing or rales.  Abdominal:     General: There is no  distension.     Palpations: Abdomen is soft.     Tenderness: There is no abdominal tenderness.  Musculoskeletal:        General: Swelling and tenderness present.     Cervical back: Normal range of motion and neck supple.     Comments: Pain along the tip of the right scapula.  No obvious crepitus or deformity to the scapula.  Able to range the shoulder without significant discomfort.  No erythema or warmth of the shoulder.  No obvious bony tenderness.  Pulse motor and sensation intact distally.  No weakness.  Skin:    General: Skin is warm and dry.  Neurological:     Mental Status: She is alert and oriented to person, place, and time.  Psychiatric:        Behavior: Behavior normal.     ED Results / Procedures / Treatments   Labs (all labs ordered are listed, but only abnormal results are displayed) Labs Reviewed - No data to display  EKG EKG Interpretation  Date/Time:  Friday November 17 2022 15:15:16 EST Ventricular Rate:  56 PR Interval:  198 QRS Duration:  94 QT Interval:  439 QTC Calculation: 424 R Axis:   81 Text Interpretation: Sinus rhythm Borderline right axis deviation Anteroseptal infarct, age indeterminate No significant change since last tracing Confirmed by Deno Etienne [phone]) on 11/17/2022 3:29:27 PM  Radiology DG Shoulder Right  Result Date: 11/17/2022 CLINICAL DATA:  Right shoulder pain.  No known trauma. EXAM: RIGHT SHOULDER - 2+ VIEW COMPARISON:  None available FINDINGS: Mild inferior glenoid and humeral head-neck junction degenerative osteophytosis. Mild acromioclavicular joint space narrowing and peripheral osteophytosis. No acute fracture is seen. No dislocation. IMPRESSION: Mild glenohumeral and acromioclavicular osteoarthritis. Electronically Signed   By: Yvonne Kendall M.D.   On: 11/17/2022 12:04    Procedures Procedures    Medications Ordered in ED Medications  ketorolac (TORADOL) 15 MG/ML injection 15 mg (has no administration in time range)   HYDROcodone-acetaminophen (NORCO/VICODIN) 5-325 MG per tablet 1 tablet (1 tablet Oral Given 11/17/22 1134)  ondansetron (ZOFRAN-ODT) disintegrating tablet 4 mg (4 mg Oral Given 11/17/22 1135)    ED Course/ Medical Decision Making/ A&P                           Medical Decision Making Risk Prescription drug management.   77 yo F with a cc of right shoulder pain.  Going on for the past week.  Has been seen at urgent care.  She was started on diclofenac gel which it sounds like she has not been taking, Norco and Robaxin.  She tells me that his other 2 medicines have made her sick so she stopped taking them.  Feels like maybe they are too strong for her and she would like a different narcotic to help her at home.  I had a long discussion with her about appropriate pain control.  I did recommend her using the diclofenac gel.  Will give her a sling for comfort.  Encouraged her to call her PCP as she may need further imaging or physical therapy.  Plain film of the right shoulder independently interpreted by me without fracture or dislocation.  3:45 PM:  I have discussed the diagnosis/risks/treatment options with the patient.  Evaluation and diagnostic testing in the emergency department does not suggest an emergent condition requiring admission or immediate intervention beyond what has been performed at this time.  They will follow up with PCP. We also discussed returning to the ED immediately if new or worsening sx occur. We discussed the sx which are most concerning (e.g., sudden worsening pain, fever, inability to tolerate by mouth) that necessitate immediate return. Medications administered to the patient during their visit and any new prescriptions provided to the patient are listed below.  Medications given during this visit Medications  ketorolac (TORADOL) 15 MG/ML injection 15 mg (has no administration in time range)  HYDROcodone-acetaminophen (NORCO/VICODIN) 5-325 MG per tablet 1 tablet (1  tablet Oral Given 11/17/22 1134)  ondansetron (ZOFRAN-ODT) disintegrating tablet 4 mg (4 mg Oral Given 11/17/22 1135)     The patient appears reasonably screen and/or stabilized for discharge and I doubt any other medical condition or other [facility] requiring further screening, evaluation, or treatment in the ED at this time prior to discharge.          Final Clinical Impression(s) / ED Diagnoses Final diagnoses:  None    Rx / DC Orders ED Discharge Orders     None         Deno Etienne, DO 11/17/22 1545

## 2022-11-28 ENCOUNTER — Other Ambulatory Visit: Payer: Self-pay

## 2022-11-28 DIAGNOSIS — E785 Hyperlipidemia, unspecified: Secondary | ICD-10-CM

## 2022-11-28 MED ORDER — SIMVASTATIN 40 MG PO TABS: 40.0000 mg | ORAL_TABLET | Freq: Every day | ORAL | 3 refills | Status: DC

## 2022-11-28 NOTE — Telephone Encounter (Signed)
Requesting to speak with a nurse about getting medication refill that is not on the list. Please call pt back.

## 2022-11-28 NOTE — Telephone Encounter (Signed)
Return pt's call - stated she needs a refill on Simvastatin 40 mg.

## 2022-12-25 ENCOUNTER — Encounter: Payer: Self-pay | Admitting: Student in an Organized Health Care Education/Training Program

## 2022-12-25 ENCOUNTER — Ambulatory Visit: Payer: Medicare HMO

## 2022-12-25 ENCOUNTER — Ambulatory Visit (INDEPENDENT_AMBULATORY_CARE_PROVIDER_SITE_OTHER): Payer: Medicare HMO | Admitting: Student in an Organized Health Care Education/Training Program

## 2022-12-25 VITALS — BP 128/54 | HR 63 | Temp 98.1°F | Ht 65.0 in | Wt 183.4 lb

## 2022-12-25 DIAGNOSIS — I129 Hypertensive chronic kidney disease with stage 1 through stage 4 chronic kidney disease, or unspecified chronic kidney disease: Secondary | ICD-10-CM

## 2022-12-25 DIAGNOSIS — E785 Hyperlipidemia, unspecified: Secondary | ICD-10-CM | POA: Diagnosis not present

## 2022-12-25 DIAGNOSIS — Z0000 Encounter for general adult medical examination without abnormal findings: Secondary | ICD-10-CM | POA: Diagnosis not present

## 2022-12-25 DIAGNOSIS — E1122 Type 2 diabetes mellitus with diabetic chronic kidney disease: Secondary | ICD-10-CM | POA: Diagnosis not present

## 2022-12-25 DIAGNOSIS — N1831 Chronic kidney disease, stage 3a: Secondary | ICD-10-CM | POA: Diagnosis not present

## 2022-12-25 DIAGNOSIS — I10 Essential (primary) hypertension: Secondary | ICD-10-CM

## 2022-12-25 DIAGNOSIS — E1169 Type 2 diabetes mellitus with other specified complication: Secondary | ICD-10-CM

## 2022-12-25 LAB — POCT GLYCOSYLATED HEMOGLOBIN (HGB A1C): Hemoglobin A1C: 6.9 % — AB (ref 4.0–5.6)

## 2022-12-25 LAB — GLUCOSE, CAPILLARY: Glucose-Capillary: 128 mg/dL — ABNORMAL HIGH (ref 70–99)

## 2022-12-25 NOTE — Assessment & Plan Note (Signed)
Chronic and stable issue with estimated GFR around 45.  Urine microalbumin negative.  I offered the patient a switch in her medications to include estrogen inhibitor and ARB to lower the risk of progressive renal failure.  Patient is most comfortable with her current medications.  Is worried about side effects to the medications.  She declines this change right now.  Will check a BMP today.

## 2022-12-25 NOTE — Progress Notes (Signed)
Annual Wellness Visit     Patient: [name], Female    DOB: 07/13/1946, 77 y.o.   MRN: [id number]  Subjective  No chief complaint on file.   [name] is a 77 y.o. female who presents today for her Annual Wellness Visit. She reports consuming a general diet. The patient does not participate in regular exercise at present. She generally feels well. She reports sleeping well. She does not have additional problems to discuss today.   Patient lives with her son, she is very independent in all activities of daily living.  She still drives without difficulty.  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  She really enjoys this work, has been doing it for 15 years.  She remains very active and functional.  She manages her own medications.  She does report missing several days a week consistently.  She declines using a pillbox.  HPI   Patient Active Problem List   Diagnosis Date Noted   Type 2 diabetes mellitus with other specified complication ([place]) 123XX123    Priority: High   Essential hypertension 09/06/2007    Priority: High   Stage 3a chronic kidney disease (CKD) ([place]) 12/25/2022    Priority: Medium    Diverticulosis 06/19/2022    Priority: Low   Osteopenia 01/28/2018    Priority: Low   Obesity (BMI 30.0-34.9) 01/28/2018    Priority: Low   Routine health maintenance 03/20/2012    Priority: Low   Dyslipidemia 09/06/2007    Priority: Low      Medications: Outpatient Medications Prior to Visit  Medication Sig   aspirin 81 MG chewable tablet Chew 81 mg by mouth daily.   chlorthalidone (HYGROTON) 25 MG tablet Take 1 tablet (25 mg total) by mouth daily.   pioglitazone (ACTOS) 30 MG tablet Take 1 tablet (30 mg total) by mouth daily.   simvastatin (ZOCOR) 40 MG tablet Take 1 tablet (40 mg total) by mouth at bedtime.   No facility-administered medications prior to visit.    Allergies  Allergen Reactions   Metformin Other (See Comments)    REACTION:  Nausea/Diarrhea, feeling very sick, missing work    Patient Care Team: Axel Filler, MD as PCP - General (Internal Medicine) Josephina Shih as Consulting Physician (Optometry)  ROS No recent fevers or chills, no chest pain or dyspnea on exertion     Objective  BP (!) 128/54 (BP Location: Left Arm, Patient Position: Sitting, Cuff Size: Small)   Pulse 63   Ht 5' 5"$  (1.651 m)   Wt 183 lb 6.4 oz (83.2 kg)   LMP 04/10/1996   BMI 30.52 kg/m    Physical Exam  Gen: Well-appearing woman, no distress Neuro: Alert, conversational, full strength in the upper and lower extremities, normal get up and go test, normal gait and balance CV: Regular rate and rhythm with no murmurs Lungs: Clear to auscultation throughout Ext: Warm and well-perfused with no lower extremity edema   Most recent functional status assessment:    06/19/2022    9:26 AM  In your present state of health, do you have any difficulty performing the following activities:  Hearing? 0  Vision? 0  Difficulty concentrating or making decisions? 0  Walking or climbing stairs? 0  Dressing or bathing? 0  Doing errands, shopping? 0   Most recent fall risk assessment:    06/19/2022    9:36 AM  Fall Risk   Falls in the past year? 0  Number  falls in past yr: 0  Injury with Fall? 0  Risk for fall due to : No Fall Risks  Follow up Falls evaluation completed    Most recent depression screenings:    06/19/2022    9:27 AM 01/30/2022    1:33 PM  PHQ 2/9 Scores  PHQ - 2 Score 0 0   Most recent cognitive screening: Patient has no concerns about her cognitive function or memory.  Declines to complete a mini cog because she is "fine".  Most recent Audit-C alcohol use screening     No data to display         A score of 3 or more in women, and 4 or more in men indicates increased risk for alcohol abuse, EXCEPT if all of the points are from question 1     Results for orders placed or performed in visit on  12/25/22  Glucose, capillary  Result Value Ref Range   Glucose-Capillary 128 (H) 70 - 99 mg/dL  Results for orders placed or performed in visit on 12/25/22  POC Hbg A1C  Result Value Ref Range   Hemoglobin A1C 6.9 (A) 4.0 - 5.6 %   HbA1c POC (<> result, manual entry)     HbA1c, POC (prediabetic range)     HbA1c, POC (controlled diabetic range)        Assessment & Plan   Annual wellness visit done today including the all of the following: Reviewed patient's Family Medical History Reviewed and updated list of patient's medical providers Assessment of cognitive impairment was done Assessed patient's functional ability Established a written schedule for health screening [place] Completed and Reviewed  Exercise Activities and Dietary recommendations  Goals      Eat more fruits and vegetables     HEMOGLOBIN A1C < 7.0     HEMOGLOBIN A1C < 7.0        Immunization History  Administered Date(s) Administered   Fluad Quad(high Dose 65+) 08/16/2021   Influenza Split 08/20/2012   Influenza Whole 09/12/2006, 09/06/2007, 08/06/2008, 09/09/2009, 09/08/2010, 07/04/2011   Influenza,inj,Quad PF,6+ Mos 07/22/2013, 08/05/2014, 09/16/2015, 08/08/2016, 07/24/2017, 08/05/2018, 09/10/2019, 10/19/2020, 08/08/2022   PFIZER(Purple Top)SARS-COV-2 Vaccination 03/12/2020, 04/13/2020   PPD Test 03/08/2018   Pneumococcal Conjugate-13 04/27/2015   Pneumococcal Polysaccharide-23 07/22/2013   Tdap 06/04/2017    Health Maintenance  Topic Date Due   Zoster Vaccines- Shingrix (1 of 2) Never done   OPHTHALMOLOGY EXAM  03/15/2016   COVID-19 Vaccine (3 - 2023-24 season) 07/14/2022   Diabetic kidney evaluation - eGFR measurement  06/08/2023   Diabetic kidney evaluation - Urine ACR  06/20/2023   FOOT EXAM  06/20/2023   HEMOGLOBIN A1C  06/25/2023   Medicare Annual Wellness (AWV)  12/26/2023   COLONOSCOPY (Pts 45-42yr Insurance coverage will need to be confirmed)  06/09/2026    DTaP/Tdap/Td (2 - Td or Tdap) 06/05/2027   Pneumonia Vaccine 77 Years old  Completed   INFLUENZA VACCINE  Completed   DEXA SCAN  Completed   Hepatitis C Screening  Completed   HPV VACCINES  Aged Out     Discussed health benefits of physical activity, and encouraged her to engage in regular exercise appropriate for her age and condition.    Problem List Items Addressed This Visit       High   Type 2 diabetes mellitus with other specified complication ([place] - Primary (Chronic)    Chronic and stable.  Well-controlled with an A1c of 6.9%.  She has been on  historic medication, pioglitazone, for many years and is comfortable with this.  Reports inconsistent use of the medication.  No side effects.  I offered her a change to an SGLT2 inhibitor which would offer better renal protection.  Patient declines this change, she is worried about new side effects from the medication.      Relevant Medications   aspirin 81 MG chewable tablet   Other Relevant Orders   POC Hbg A1C (Completed)   BMP8+Anion Gap   Essential hypertension (Chronic)    Blood pressure well-controlled today.  Patient reports inconsistent use of chlorthalidone 25 mg.  Talked about more consistent use.  I offered changing this to a losartan in order to be renal protective.  Patient declines this change right now, is worried about side effects.      Relevant Medications   aspirin 81 MG chewable tablet     Medium    Stage 3a chronic kidney disease (CKD) (HCC) (Chronic)    Chronic and stable issue with estimated GFR around 45.  Urine microalbumin negative.  I offered the patient a switch in her medications to include estrogen inhibitor and ARB to lower the risk of progressive renal failure.  Patient is most comfortable with her current medications.  Is worried about side effects to the medications.  She declines this change right now.  Will check a BMP today.        Low   Dyslipidemia (Chronic)    History of hyperlipidemia on  simvastatin 40 mg daily and takes an aspirin 81 mg every day for primary prevention of ischemic events.  Will check lipids today.  No signs or symptoms to suggest new ischemic vascular disease.      Relevant Orders   Lipid Profile    Return in about 3 months (around 03/25/2023).     Axel Filler, MD

## 2022-12-25 NOTE — Assessment & Plan Note (Signed)
Chronic and stable.  Well-controlled with an A1c of 6.9%.  She has been on historic medication, pioglitazone, for many years and is comfortable with this.  Reports inconsistent use of the medication.  No side effects.  I offered her a change to an SGLT2 inhibitor which would offer better renal protection.  Patient declines this change, she is worried about new side effects from the medication.

## 2022-12-25 NOTE — Assessment & Plan Note (Signed)
Blood pressure well-controlled today.  Patient reports inconsistent use of chlorthalidone 25 mg.  Talked about more consistent use.  I offered changing this to a losartan in order to be renal protective.  Patient declines this change right now, is worried about side effects.

## 2022-12-25 NOTE — Patient Instructions (Signed)
Annual Wellness Visit   Medicare Covered Preventative Screenings and Services  Services & Screenings Men and Women Who How Often Need? Date of Last Service Action  Abdominal Aortic Aneurysm Adults with AAA risk factors Once     Alcohol Misuse and Counseling All Adults Screening once a year if no alcohol misuse. Counseling up to 4 face to face sessions.     Bone Density Measurement  Adults at risk for osteoporosis Once every 2 yrs     Lipid Panel Z13.6 All adults without CV disease Once every 5 yrs     Colorectal Cancer  Stool sample or Colonoscopy All adults 27 and older  Once every year Every 10 years     Depression All Adults Once a year  Today   Diabetes Screening Blood glucose, post glucose load, or GTT Z13.1 All adults at risk Pre-diabetics Once per year Twice per year     Diabetes  Self-Management Training All adults Diabetics 10 hrs first year; 2 hours subsequent years. Requires Copay     Glaucoma Diabetics Family history of glaucoma African Americans 21 yrs + Hispanic Americans 59 yrs + Annually - requires coppay     Hepatitis C Z72.89 or F19.20 High Risk for HCV Born between 1945 and 1965 Annually Once     HIV Z11.4 All adults based on risk Annually btw ages 75 & 64 regardless of risk Annually > 65 yrs if at increased risk     Lung Cancer Screening Asymptomatic adults aged 57-77 with 30 pack yr history and current smoker OR quit within the last 15 yrs Annually Must have counseling and shared decision making documentation before first screen     Medical Nutrition Therapy Adults with  Diabetes Renal disease Kidney transplant within past 3 yrs 3 hours first year; 2 hours subsequent years     Obesity and Counseling All adults Screening once a year Counseling if BMI 30 or higher  Today   Tobacco Use Counseling Adults who use tobacco  Up to 8 visits in one year     Vaccines Z23 Hepatitis B Influenza  Pneumonia  Adults  Once Once every flu season Two different  vaccines separated by one year     Next Annual Wellness Visit People with Medicare Every year       Services & Screenings Women Who How Often Need  Date of Last Service Action  Mammogram  Z12.31 Women over 3 One baseline ages 10-39. Annually ager 40 yrs+     Pap tests All women Annually if high risk. Every 2 yrs for normal risk women     Screening for cervical cancer with  Pap (Z01.419 nl or Z01.411abnl) & HPV Z11.51 Women aged 30 to 44 Once every 5 yrs     Screening pelvic and breast exams All women Annually if high risk. Every 2 yrs for normal risk women     Sexually Transmitted Diseases Chlamydia Gonorrhea Syphilis All at risk adults Annually for non pregnant females at increased risk         [place] Men Who How Ofter Need  Date of Last Service Action  Prostate Cancer - DRE & PSA Men over 50 Annually.  DRE might require a copay.     Sexually Transmitted Diseases Syphilis All at risk adults Annually for men at increased risk         Things That May Be Affecting Your Health:  Alcohol  Hearing loss  Pain    Depression  Home  Safety  Sexual Health  x Diabetes  Lack of physical activity  Stress   Difficulty with daily activities  Loneliness  Tiredness   Drug use x Medicines  Tobacco use   Falls  Motor Vehicle Safety  Weight   Food choices  Oral Health  Other    YOUR PERSONALIZED HEALTH PLAN : 1. Schedule your next subsequent Medicare Wellness visit in one year 2. Attend all of your regular appointments to address your medical issues 3. Complete the preventative screenings and services

## 2022-12-25 NOTE — Assessment & Plan Note (Signed)
History of hyperlipidemia on simvastatin 40 mg daily and takes an aspirin 81 mg every day for primary prevention of ischemic events.  Will check lipids today.  No signs or symptoms to suggest new ischemic vascular disease.

## 2022-12-27 LAB — LIPID PANEL
Chol/HDL Ratio: 3 ratio (ref 0.0–4.4)
Cholesterol, Total: 197 mg/dL (ref 100–199)
HDL: 65 mg/dL (ref >39)
LDL Chol Calc (NIH): 122 mg/dL — ABNORMAL HIGH (ref 0–99)
Triglycerides: 53 mg/dL (ref 0–149)
VLDL Cholesterol Cal: 10 mg/dL (ref 5–40)

## 2022-12-27 LAB — BMP8+ANION GAP
Anion Gap: 17 mmol/L (ref 10.0–18.0)
BUN/Creatinine Ratio: 21 (ref 12–28)
BUN: 24 mg/dL (ref 8–27)
CO2: 26 mmol/L (ref 20–29)
Calcium: 10 mg/dL (ref 8.7–10.3)
Chloride: 99 mmol/L (ref 96–106)
Creatinine, Ser: 1.16 mg/dL — ABNORMAL HIGH (ref 0.57–1.00)
Glucose: 121 mg/dL — ABNORMAL HIGH (ref 70–99)
Potassium: 3.5 mmol/L (ref 3.5–5.2)
Sodium: 142 mmol/L (ref 134–144)
eGFR: 49 mL/min/{1.73_m2} — ABNORMAL LOW (ref >59)

## 2022-12-28 ENCOUNTER — Encounter: Payer: Self-pay | Admitting: Student in an Organized Health Care Education/Training Program

## 2023-01-15 DIAGNOSIS — H25812 Combined forms of age-related cataract, left eye: Secondary | ICD-10-CM | POA: Diagnosis not present

## 2023-01-15 DIAGNOSIS — Z961 Presence of intraocular lens: Secondary | ICD-10-CM | POA: Diagnosis not present

## 2023-01-24 DIAGNOSIS — H2512 Age-related nuclear cataract, left eye: Secondary | ICD-10-CM | POA: Diagnosis not present

## 2023-02-27 DIAGNOSIS — H25812 Combined forms of age-related cataract, left eye: Secondary | ICD-10-CM | POA: Diagnosis not present

## 2023-05-28 ENCOUNTER — Encounter: Payer: Self-pay | Admitting: Student in an Organized Health Care Education/Training Program

## 2023-05-28 ENCOUNTER — Ambulatory Visit (INDEPENDENT_AMBULATORY_CARE_PROVIDER_SITE_OTHER): Payer: Medicare HMO | Admitting: Student in an Organized Health Care Education/Training Program

## 2023-05-28 VITALS — BP 132/65 | HR 62 | Temp 98.5°F | Ht 65.0 in | Wt 187.1 lb

## 2023-05-28 DIAGNOSIS — I129 Hypertensive chronic kidney disease with stage 1 through stage 4 chronic kidney disease, or unspecified chronic kidney disease: Secondary | ICD-10-CM | POA: Diagnosis not present

## 2023-05-28 DIAGNOSIS — I10 Essential (primary) hypertension: Secondary | ICD-10-CM

## 2023-05-28 DIAGNOSIS — N1831 Chronic kidney disease, stage 3a: Secondary | ICD-10-CM

## 2023-05-28 DIAGNOSIS — Z7984 Long term (current) use of oral hypoglycemic drugs: Secondary | ICD-10-CM | POA: Diagnosis not present

## 2023-05-28 DIAGNOSIS — E1169 Type 2 diabetes mellitus with other specified complication: Secondary | ICD-10-CM

## 2023-05-28 DIAGNOSIS — E1122 Type 2 diabetes mellitus with diabetic chronic kidney disease: Secondary | ICD-10-CM | POA: Diagnosis not present

## 2023-05-28 LAB — POCT GLYCOSYLATED HEMOGLOBIN (HGB A1C): Hemoglobin A1C: 6.7 % — AB (ref 4.0–5.6)

## 2023-05-28 LAB — GLUCOSE, CAPILLARY: Glucose-Capillary: 118 mg/dL — ABNORMAL HIGH (ref 70–99)

## 2023-05-28 NOTE — Assessment & Plan Note (Signed)
A1c 6.7% 6.  Diabetes is well-controlled.  Asymptomatic.  Will plan to continue pioglitazone 30 mg daily, which she is able to start medication, but 1 that she is doing well with.  No other comorbidities to need a medication change at this time.  Will check urine microalbumin today.

## 2023-05-28 NOTE — Assessment & Plan Note (Signed)
Blood pressure well-controlled today.  Plan to continue chlorthalidone 25 mg daily.  Labs in February were appropriate.

## 2023-05-28 NOTE — Assessment & Plan Note (Signed)
Stable kidney function.  No new symptoms.  Euvolemic on exam.  Will check urine microalbumin today.

## 2023-05-28 NOTE — Addendum Note (Signed)
Addended by: Erlinda Hong T on: 05/28/2023 11:05 AM   Modules accepted: Orders

## 2023-05-28 NOTE — Progress Notes (Signed)
   Established Patient Office Visit  Subjective   Patient ID: [name], female    DOB: 1946-10-26  Age: 77 y.o. MRN: [id number]   HPI  77 year old person here for routine follow-up of diabetes.  Doing very well since I last saw her.  She has no concerns today.  No issues with medications.  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  She denies any chest pain or discomfort with exertion.  She is active, exercises on a daily basis.  Reports watching what she eats, no recent weight gain.    Objective:     BP 132/65 (BP Location: Right Arm, Patient Position: Sitting, Cuff Size: Small)   Pulse 62   Temp 98.5 F (36.9 C) (Oral)   Ht 5\' 5"  (1.651 m)   Wt 187 lb 1.6 oz (84.9 kg)   LMP 04/10/1996   SpO2 100%   BMI 31.14 kg/m    Physical Exam  Gen: Well-appearing woman, no distress Neck: Normal with no thyromegaly CV: Regular rate and rhythm with no murmurs Lungs: Clear throughout with no wheezing Ext: Warm well-perfused with no lower extremity edema    Assessment & Plan:   Problem List Items Addressed This Visit       High   Type 2 diabetes mellitus with other specified complication ([facility]) - Primary (Chronic)    A1c 6.7% 6.  Diabetes is well-controlled.  Asymptomatic.  Will plan to continue pioglitazone 30 mg daily, which she is able to start medication, but 1 that she is doing well with.  No other comorbidities to need a medication change at this time.  Will check urine microalbumin today.      Relevant Orders   POC Hbg A1C (Completed)   Essential hypertension (Chronic)    Blood pressure well-controlled today.  Plan to continue chlorthalidone 25 mg daily.  Labs in February were appropriate.        Medium    Stage 3a chronic kidney disease (CKD) (HCC) (Chronic)    Stable kidney function.  No new symptoms.  Euvolemic on exam.  Will check urine microalbumin today.       Return in about 4  months (around 09/28/2023).    Tyson Alias, MD

## 2023-05-30 LAB — MICROALBUMIN / CREATININE URINE RATIO
Creatinine, Urine: 105.5 mg/dL
Microalb/Creat Ratio: 5 mg/g creat (ref 0–29)
Microalbumin, Urine: 5.3 ug/mL

## 2023-06-28 ENCOUNTER — Encounter: Payer: Self-pay | Admitting: Pharmacist

## 2023-06-28 NOTE — Progress Notes (Signed)
Adherence check-in. Appears overdue - pioglitazone 30 day filled on 04/30/23 and simvastatin 90 day filled on 03/13/23. Called patient. Patient reports picking up the medications 2 weeks ago and is taking them. Checked Dr. Tiajuana Amass which shows 90 day supply filled on 06/05/23 of pioglitazone. Patient reports taking simvastatin.

## 2023-10-08 ENCOUNTER — Encounter: Payer: Medicare HMO | Admitting: Student in an Organized Health Care Education/Training Program

## 2023-10-15 ENCOUNTER — Other Ambulatory Visit: Payer: Self-pay

## 2023-10-15 DIAGNOSIS — I10 Essential (primary) hypertension: Secondary | ICD-10-CM

## 2023-10-15 MED ORDER — CHLORTHALIDONE 25 MG PO TABS: 25.0000 mg | ORAL_TABLET | Freq: Every day | ORAL | 3 refills | Status: DC

## 2023-10-15 NOTE — Telephone Encounter (Signed)
Medication sent to pharmacy  

## 2023-10-18 DIAGNOSIS — Z1231 Encounter for screening mammogram for malignant neoplasm of breast: Secondary | ICD-10-CM | POA: Diagnosis not present

## 2023-10-18 LAB — HM MAMMOGRAPHY

## 2023-10-25 ENCOUNTER — Encounter: Payer: Medicare HMO | Admitting: Student

## 2023-10-30 ENCOUNTER — Ambulatory Visit (INDEPENDENT_AMBULATORY_CARE_PROVIDER_SITE_OTHER): Payer: Medicare HMO | Admitting: Internal Medicine

## 2023-10-30 VITALS — BP 138/59 | HR 63 | Temp 97.9°F | Ht 65.0 in | Wt 190.2 lb

## 2023-10-30 DIAGNOSIS — I10 Essential (primary) hypertension: Secondary | ICD-10-CM | POA: Diagnosis not present

## 2023-10-30 DIAGNOSIS — Z0000 Encounter for general adult medical examination without abnormal findings: Secondary | ICD-10-CM

## 2023-10-30 DIAGNOSIS — E785 Hyperlipidemia, unspecified: Secondary | ICD-10-CM

## 2023-10-30 DIAGNOSIS — Z23 Encounter for immunization: Secondary | ICD-10-CM

## 2023-10-30 DIAGNOSIS — E1169 Type 2 diabetes mellitus with other specified complication: Secondary | ICD-10-CM | POA: Diagnosis not present

## 2023-10-30 LAB — POCT GLYCOSYLATED HEMOGLOBIN (HGB A1C): Hemoglobin A1C: 6.8 % — AB (ref 4.0–5.6)

## 2023-10-30 LAB — GLUCOSE, CAPILLARY: Glucose-Capillary: 127 mg/dL — ABNORMAL HIGH (ref 70–99)

## 2023-10-30 NOTE — Patient Instructions (Addendum)
[name], it was a pleasure seeing you today! You endorsed feeling well today. Below are some of the things we talked about this visit. We look forward to seeing you in the follow up appointment!  Today we discussed: You are doing great! Continue doing what you are doing.   You received a flu shot today.   You have an eye exam scheduled. I am going to get lab work today and give you the result once it comes back.   I have ordered the following labs today:   Lab Orders         Glucose, capillary         BMP8+Anion Gap         Lipid Profile         POC Hbg A1C       Referrals ordered today:   Referral Orders  No referral(s) requested today     I have ordered the following medication/changed the following medications:   Stop the following medications: There are no discontinued medications.   Start the following medications: No orders of the defined types were placed in this encounter.    Follow-up: 4 month follow up    Please make sure to arrive 15 minutes prior to your next appointment. If you arrive late, you may be asked to reschedule.   We look forward to seeing you next time. Please call our clinic at [phone] if you have any questions or concerns. The best time to call is Monday-Friday from 9am-4pm, but there is someone available 24/7. If after hours or the weekend, call the main hospital number and ask for the Internal Medicine Resident On-Call. If you need medication refills, please notify your pharmacy one week in advance and they will send [place] a request.  Thank you for letting us take part in your care. Wishing you the best!  Thank you, Gwenevere Abbot, MD

## 2023-10-30 NOTE — Progress Notes (Unsigned)
   CC: routine follow up  HPI:  [name] is a 77 y.o. with medical history of HTN, HLD, DMII, GERD presenting to [facility] for a routine follow up. Pt was last seen by his PCP Dr. Oswaldo Done in 05/2023 and advised to return in 4 months.   Please see problem-based list for further details, assessments, and plans.  Past Medical History:  Diagnosis Date   Diabetes mellitus    GERD (gastroesophageal reflux disease)    HLD (hyperlipidemia)    Hypertension     Current Outpatient Medications (Endocrine & Metabolic):    pioglitazone (ACTOS) 30 MG tablet, Take 1 tablet (30 mg total) by mouth daily.  Current Outpatient Medications (Cardiovascular):    chlorthalidone (HYGROTON) 25 MG tablet, Take 1 tablet (25 mg total) by mouth daily.   simvastatin (ZOCOR) 40 MG tablet, Take 1 tablet (40 mg total) by mouth at bedtime.   Current Outpatient Medications (Analgesics):    aspirin 81 MG chewable tablet, Chew 81 mg by mouth daily.    Review of Systems:  Review of system negative unless stated in the problem list or HPI.    Physical Exam:  Vitals:   10/30/23 1531  BP: (!) 138/59  Pulse: 63  Temp: 97.9 F (36.6 C)  TempSrc: Oral  SpO2: 100%  Weight: 190 lb 3 oz (86.3 kg)  Height: 5\' 5"  (1.651 m)   Physical Exam General: NAD HENT: NCAT Lungs: CTAB, no wheeze, rhonchi or rales.  Cardiovascular: Normal heart sounds, no r/m/g, 2+ pulses in all extremities. No LE edema Abdomen: No TTP, normal bowel sounds MSK: No asymmetry or muscle atrophy.  Skin: no lesions noted on exposed skin Neuro: Alert and oriented x4. CN grossly intact Psych: Normal mood and normal affect   Assessment & Plan:   Essential hypertension On chlorthalidone 25 mg every day. Previous BMP with some elevation in creatinine. Will repeat today. Continue therapy otherwise.   Hyperlipidemia associated with type 2 diabetes mellitus (HCC) Pt has hx of HLD and last LDL 122. On simvastatin 40 mg daily. ASCVD risk 39 %.  Will repeat lipid panel with other blood work.   Type 2 diabetes mellitus with other specified complication (HCC) Pt with hx of DMII, her A1c is 6.8. Will continue actos 30 mg daily.   Routine health maintenance Eye exam-appointment next month Foot exam-deferred Flu shot-done   See Encounters Tab for problem based charting.  Patient Discussed with Dr. Karrie Meres, MD Eligha Bridegroom. [facility] Internal Medicine Residency, PGY-3

## 2023-10-31 LAB — BMP8+ANION GAP
Anion Gap: 15 mmol/L (ref 10.0–18.0)
BUN/Creatinine Ratio: 22 (ref 12–28)
BUN: 25 mg/dL (ref 8–27)
CO2: 26 mmol/L (ref 20–29)
Calcium: 9.7 mg/dL (ref 8.7–10.3)
Chloride: 98 mmol/L (ref 96–106)
Creatinine, Ser: 1.15 mg/dL — ABNORMAL HIGH (ref 0.57–1.00)
Glucose: 154 mg/dL — ABNORMAL HIGH (ref 70–99)
Potassium: 3.5 mmol/L (ref 3.5–5.2)
Sodium: 139 mmol/L (ref 134–144)
eGFR: 49 mL/min/{1.73_m2} — ABNORMAL LOW (ref >59)

## 2023-10-31 LAB — LIPID PANEL
Chol/HDL Ratio: 2.8 {ratio} (ref 0.0–4.4)
Cholesterol, Total: 191 mg/dL (ref 100–199)
HDL: 69 mg/dL (ref >39)
LDL Chol Calc (NIH): 111 mg/dL — ABNORMAL HIGH (ref 0–99)
Triglycerides: 58 mg/dL (ref 0–149)
VLDL Cholesterol Cal: 11 mg/dL (ref 5–40)

## 2023-10-31 NOTE — Assessment & Plan Note (Signed)
Pt with hx of DMII, her A1c is 6.8. Will continue actos 30 mg daily.

## 2023-10-31 NOTE — Assessment & Plan Note (Signed)
Pt has hx of HLD and last LDL 122. On simvastatin 40 mg daily. ASCVD risk 39 %. Will repeat lipid panel with other blood work.

## 2023-10-31 NOTE — Assessment & Plan Note (Signed)
Eye exam-appointment next month Foot exam-deferred Flu shot-done

## 2023-10-31 NOTE — Assessment & Plan Note (Signed)
On chlorthalidone 25 mg every day. Previous BMP with some elevation in creatinine. Will repeat today. Continue therapy otherwise.

## 2023-11-05 NOTE — Progress Notes (Signed)
Internal Medicine Clinic Attending  Case discussed with the resident at the time of the visit.  We reviewed the resident's history and exam and pertinent patient test results.  I agree with the assessment, diagnosis, and plan of care documented in the resident's note.  

## 2023-11-10 ENCOUNTER — Encounter: Payer: Self-pay | Admitting: Internal Medicine

## 2023-11-13 ENCOUNTER — Encounter: Payer: Self-pay | Admitting: *Deleted

## 2023-11-26 ENCOUNTER — Other Ambulatory Visit: Payer: Self-pay

## 2023-11-26 DIAGNOSIS — E119 Type 2 diabetes mellitus without complications: Secondary | ICD-10-CM

## 2023-11-26 MED ORDER — PIOGLITAZONE HCL 30 MG PO TABS: 30.0000 mg | ORAL_TABLET | Freq: Every day | ORAL | 3 refills | Status: DC

## 2023-12-13 DIAGNOSIS — E119 Type 2 diabetes mellitus without complications: Secondary | ICD-10-CM | POA: Diagnosis not present

## 2023-12-13 DIAGNOSIS — Z961 Presence of intraocular lens: Secondary | ICD-10-CM | POA: Diagnosis not present

## 2023-12-13 LAB — HM DIABETES EYE EXAM

## 2024-02-14 ENCOUNTER — Ambulatory Visit: Admitting: Student in an Organized Health Care Education/Training Program

## 2024-02-21 ENCOUNTER — Encounter: Admitting: Student in an Organized Health Care Education/Training Program

## 2024-02-28 ENCOUNTER — Encounter: Admitting: Student in an Organized Health Care Education/Training Program

## 2024-03-09 ENCOUNTER — Encounter (HOSPITAL_COMMUNITY): Payer: Self-pay

## 2024-03-09 ENCOUNTER — Ambulatory Visit (HOSPITAL_COMMUNITY)
Admission: EM | Admit: 2024-03-09 | Discharge: 2024-03-09 | Disposition: A | Discharge status: Home / Self Care | Attending: Emergency Medicine | Admitting: Emergency Medicine

## 2024-03-09 ENCOUNTER — Ambulatory Visit (INDEPENDENT_AMBULATORY_CARE_PROVIDER_SITE_OTHER)

## 2024-03-09 DIAGNOSIS — Z79899 Other long term (current) drug therapy: Secondary | ICD-10-CM | POA: Diagnosis not present

## 2024-03-09 DIAGNOSIS — M25551 Pain in right hip: Secondary | ICD-10-CM | POA: Diagnosis not present

## 2024-03-09 DIAGNOSIS — M545 Low back pain, unspecified: Secondary | ICD-10-CM

## 2024-03-09 LAB — POCT URINALYSIS DIP (MANUAL ENTRY)
Bilirubin, UA: NEGATIVE
Glucose, UA: NEGATIVE mg/dL
Ketones, POC UA: NEGATIVE mg/dL
Nitrite, UA: NEGATIVE
Protein Ur, POC: NEGATIVE mg/dL
Spec Grav, UA: 1.02 (ref 1.010–1.025)
Urobilinogen, UA: 0.2 U/dL
pH, UA: 5.5 (ref 5.0–8.0)

## 2024-03-09 MED ORDER — DEXAMETHASONE SODIUM PHOSPHATE 10 MG/ML IJ SOLN
INTRAMUSCULAR | Status: AC
  Filled 2024-03-09: qty 1

## 2024-03-09 MED ORDER — DEXAMETHASONE SODIUM PHOSPHATE 10 MG/ML IJ SOLN
10.0000 mg | Freq: Once | INTRAMUSCULAR | Status: AC
  Administered 2024-03-09: 10 mg via INTRAMUSCULAR

## 2024-03-09 MED ORDER — BACLOFEN 10 MG PO TABS: 10.0000 mg | ORAL_TABLET | Freq: Three times a day (TID) | ORAL | 0 refills | Status: DC

## 2024-03-09 MED ORDER — LIDOCAINE 5 % EX PTCH: 1.0000 | MEDICATED_PATCH | CUTANEOUS | 0 refills | Status: DC

## 2024-03-09 NOTE — ED Triage Notes (Signed)
 Pt states that she has some right hip pain. X1 month

## 2024-03-09 NOTE — ED Provider Notes (Signed)
 [facility]    CSN: [id number] Arrival date & time: 03/09/24  1038      History   Chief Complaint Chief Complaint  Patient presents with   Hip Pain    HPI [name] is a 78 y.o. female.   Patient presents with right hip and low back pain x 1 month.  Patient denies any radiating pain, numbness, tingling, and weakness to legs.  Patient states the pain is worse with upon standing or sitting down.  Patient denies any pain with walking, and is able to walk per normal. Patient denies any recent falls or injury.  Patient any previous pain to right hip.  Patient states that she has been drinking lots of cranberry juice because she thought it may be related to urinary tract infection.  Patient denies dysuria, urinary frequency/urgency, hematuria, and fever.  PMH of hypertension, type II DM, stage III CKD, hyperlipidemia, and osteopenia.    The history is provided by the patient and medical records.  Hip Pain    Past Medical History:  Diagnosis Date   Diabetes mellitus    GERD (gastroesophageal reflux disease)    HLD (hyperlipidemia)    Hypertension     Patient Active Problem List   Diagnosis Date Noted   Stage 3a chronic kidney disease (CKD) (HCC) 12/25/2022   Diverticulosis 06/19/2022   Osteopenia 01/28/2018   Obesity (BMI 30.0-34.9) 01/28/2018   Routine health maintenance 03/20/2012   Type 2 diabetes mellitus with other specified complication (HCC) 09/06/2007   Hyperlipidemia associated with type 2 diabetes mellitus (HCC) 09/06/2007   Essential hypertension 09/06/2007    History reviewed. No pertinent surgical history.  OB History   No obstetric history on file.      Home Medications    Prior to Admission medications   Medication Sig Start Date End Date Taking? Authorizing Provider  aspirin  81 MG chewable tablet Chew 81 mg by mouth daily.   Yes [provider]  baclofen  (LIORESAL ) 10 MG tablet Take 1 tablet (10 mg total) by mouth 3  (three) times daily. 03/09/24  Yes Rosevelt Constable, Temprance Wyre A, NP  chlorthalidone  (HYGROTON ) 25 MG tablet Take 1 tablet (25 mg total) by mouth daily. 10/15/23  Yes Ether Hercules, MD  lidocaine  (LIDODERM ) 5 % Place 1 patch onto the skin daily. Remove & Discard patch within 12 hours or as directed by MD 03/09/24  Yes Levora Reas A, NP  pioglitazone  (ACTOS ) 30 MG tablet Take 1 tablet (30 mg total) by mouth daily. 11/26/23  Yes Ether Hercules, MD  simvastatin  (ZOCOR ) 40 MG tablet Take 1 tablet (40 mg total) by mouth at bedtime. 11/28/22  Yes Ether Hercules, MD    Family History Family History  Problem Relation Age of Onset   Stroke Neg Hx    Heart disease Neg Hx    Cancer Neg Hx     Social History Social History   Tobacco Use   Smoking status: Never   Smokeless tobacco: Never  Vaping Use   Vaping status: Never Used  Substance Use Topics   Alcohol use: No   Drug use: No     Allergies   Metformin   Review of Systems Review of Systems  Per HPI  Physical Exam Triage Vital Signs ED Triage Vitals  Encounter Vitals Group     BP 03/09/24 1104 134/63     Systolic BP Percentile --      Diastolic BP Percentile --  Pulse Rate 03/09/24 1104 69     Resp 03/09/24 1104 16     Temp 03/09/24 1104 98.8 F (37.1 C)     Temp Source 03/09/24 1104 Oral     SpO2 03/09/24 1104 98 %     Weight 03/09/24 1102 185 lb (83.9 kg)     Height 03/09/24 1102 5\' 5"  (1.651 m)     Head Circumference --      Peak Flow --      Pain Score 03/09/24 1102 10     Pain Loc --      Pain Education --      Exclude from Growth Chart --    No data found.  Updated Vital Signs BP 134/63 (BP Location: Right Arm)   Pulse 69   Temp 98.8 F (37.1 C) (Oral)   Resp 16   Ht 5\' 5"  (1.651 m)   Wt 185 lb (83.9 kg)   LMP 04/10/1996   SpO2 98%   BMI 30.79 kg/m   Visual Acuity Right Eye Distance:   Left Eye Distance:   Bilateral Distance:    Right Eye Near:   Left Eye Near:     Bilateral Near:     Physical Exam Vitals and nursing note reviewed.  Constitutional:      General: She is awake. She is not in acute distress.    Appearance: Normal appearance. She is well-developed and well-groomed. She is not ill-appearing.  Abdominal:     Tenderness: There is no right CVA tenderness or left CVA tenderness.  Musculoskeletal:     Lumbar back: Tenderness present. No swelling, deformity, spasms or bony tenderness. Normal range of motion. Negative right straight leg raise test and negative left straight leg raise test.     Right hip: Tenderness present. Normal range of motion.  Skin:    General: Skin is warm and dry.  Neurological:     Mental Status: She is alert.  Psychiatric:        Behavior: Behavior is cooperative.      UC Treatments / Results  Labs (all labs ordered are listed, but only abnormal results are displayed) Labs Reviewed  POCT URINALYSIS DIP (MANUAL ENTRY) - Abnormal; Notable for the following components:      Result Value   Blood, UA trace-intact (*)    Leukocytes, UA Large (3+) (*)    All other components within normal limits  URINE CULTURE    EKG   Radiology DG Hip Unilat With Pelvis 2-3 Views Right Result Date: 03/09/2024 CLINICAL DATA:  Right hip pain. EXAM: DG HIP (WITH OR WITHOUT PELVIS) 3V RIGHT COMPARISON:  CT of the abdomen pelvis 06/07/2022 FINDINGS: There is no evidence of hip fracture or dislocation. There is no evidence of arthropathy or other focal bone abnormality. IMPRESSION: Negative right hip radiographs Electronically Signed   By: Audree Leas M.D.   On: 03/09/2024 12:18    Procedures Procedures (including critical care time)  Medications Ordered in UC Medications  dexamethasone (DECADRON) injection 10 mg (has no administration in time range)    Initial Impression / Assessment and Plan / UC Course  I have reviewed the triage vital signs and the nursing notes.  Pertinent labs & imaging results that were  available during my care of the patient were reviewed by me and considered in my medical decision making (see chart for details).     Patient is well-appearing.  Vitals are stable.  Tenderness noted to right low back to right  hip.  Urinalysis ordered to rule out urinary tract infection due to patient concerns for this.  Urinalysis revealed large leukocytes with trace RBCs, will send urine culture to confirm presence of urinary tract infection.  X-ray ordered.  Based on my interpretation of x-ray there is no acute fracture or dislocation.  Radiology report confirms this.  Deferred empirically treating with antibiotic due to symptoms unlikely being musculoskeletal related versus UTI.  Pending urine culture, will treat if evidence of UTI.  Given injection of Decadron in clinic today.  Prescribed baclofen  for muscle pain and spasms.  Prescribed lidocaine  patches.  Recommended Tylenol  for pain.  Given orthopedic follow-up.  Discussed follow-up and return precautions. Final Clinical Impressions(s) / UC Diagnoses   Final diagnoses:  Pain of right hip  Acute right-sided low back pain without sciatica     Discharge Instructions      You were given an injection of Decadron today to help with inflammation causing your pain.   Take baclofen  3 times daily as needed for muscle pain and spasms. Apply lidocaine  patch for 12 hours at a time.  Otherwise you can take 650 mg of Tylenol  every 6-8 hours as needed for pain. Alternate between heat and ice to help with pain. We have sent a urine culture to rule out presence of urinary tract infection, if results are positive you will receive a phone call in a couple days advising adjustment in treatment plan. Follow-up with [facility] if pain persists. Return here as needed.     ED Prescriptions     Medication Sig Dispense Auth. Provider   baclofen  (LIORESAL ) 10 MG tablet Take 1 tablet (10 mg total) by mouth 3 (three) times daily. 30  each Levora Reas A, NP   lidocaine  (LIDODERM ) 5 % Place 1 patch onto the skin daily. Remove & Discard patch within 12 hours or as directed by MD 30 patch Karon Packer, NP      PDMP not reviewed this encounter.   Levora Reas A, NP 03/09/24 1229

## 2024-03-09 NOTE — Discharge Instructions (Addendum)
 You were given an injection of Decadron today to help with inflammation causing your pain.   Take baclofen  3 times daily as needed for muscle pain and spasms. Apply lidocaine  patch for 12 hours at a time.  Otherwise you can take 650 mg of Tylenol  every 6-8 hours as needed for pain. Alternate between heat and ice to help with pain. We have sent a urine culture to rule out presence of urinary tract infection, if results are positive you will receive a phone call in a couple days advising adjustment in treatment plan. Follow-up with [facility] if pain persists. Return here as needed.

## 2024-03-10 LAB — URINE CULTURE

## 2024-03-20 ENCOUNTER — Ambulatory Visit: Admitting: Student in an Organized Health Care Education/Training Program

## 2024-03-20 ENCOUNTER — Encounter: Payer: Self-pay | Admitting: Student in an Organized Health Care Education/Training Program

## 2024-03-20 VITALS — BP 120/62 | HR 71 | Wt 192.0 lb

## 2024-03-20 DIAGNOSIS — M19041 Primary osteoarthritis, right hand: Secondary | ICD-10-CM

## 2024-03-20 DIAGNOSIS — N1831 Chronic kidney disease, stage 3a: Secondary | ICD-10-CM | POA: Diagnosis not present

## 2024-03-20 DIAGNOSIS — E119 Type 2 diabetes mellitus without complications: Secondary | ICD-10-CM | POA: Diagnosis not present

## 2024-03-20 DIAGNOSIS — I10 Essential (primary) hypertension: Secondary | ICD-10-CM

## 2024-03-20 DIAGNOSIS — E1169 Type 2 diabetes mellitus with other specified complication: Secondary | ICD-10-CM

## 2024-03-20 DIAGNOSIS — E785 Hyperlipidemia, unspecified: Secondary | ICD-10-CM | POA: Diagnosis not present

## 2024-03-20 LAB — BASIC METABOLIC PANEL WITH GFR
BUN: 24 mg/dL — ABNORMAL HIGH (ref 6–23)
CO2: 28 meq/L (ref 19–32)
Calcium: 9.8 mg/dL (ref 8.4–10.5)
Chloride: 100 meq/L (ref 96–112)
Creatinine, Ser: 1.08 mg/dL (ref 0.40–1.20)
GFR: 49.49 mL/min — ABNORMAL LOW (ref >60.00)
Glucose, Bld: 165 mg/dL — ABNORMAL HIGH (ref 70–99)
Potassium: 3.8 meq/L (ref 3.5–5.1)
Sodium: 137 meq/L (ref 135–145)

## 2024-03-20 LAB — MICROALBUMIN / CREATININE URINE RATIO
Creatinine,U: 126 mg/dL
Microalb Creat Ratio: UNDETERMINED mg/g (ref 0.0–30.0)
Microalb, Ur: <0.7 mg/dL

## 2024-03-20 LAB — POCT GLYCOSYLATED HEMOGLOBIN (HGB A1C): Hemoglobin A1C: 7.2 % — AB (ref 4.0–5.6)

## 2024-03-20 MED ORDER — SIMVASTATIN 40 MG PO TABS: 40.0000 mg | ORAL_TABLET | Freq: Every day | ORAL | 3 refills | Status: AC

## 2024-03-20 MED ORDER — CHLORTHALIDONE 25 MG PO TABS: 25.0000 mg | ORAL_TABLET | Freq: Every day | ORAL | 3 refills | Status: DC

## 2024-03-20 MED ORDER — DICLOFENAC SODIUM 1 % EX GEL: 2.0000 g | Freq: Two times a day (BID) | CUTANEOUS | 1 refills | Status: AC | PRN

## 2024-03-20 MED ORDER — PIOGLITAZONE HCL 30 MG PO TABS: 30.0000 mg | ORAL_TABLET | Freq: Every day | ORAL | 3 refills | Status: AC

## 2024-03-20 NOTE — Progress Notes (Signed)
 Established Patient Office Visit  Subjective   Patient ID: [name], female    DOB: 06/26/1946  Age: 78 y.o. MRN: [id number]  Chief Complaint  Patient presents with   Transitions Of Care    Pain in left hand from thumb down to wrist.     HPI  78 year old person here for management of diabetes and hypertension.  It has been about a year since I last saw her.  She is doing very well.  No illnesses, hospitalizations, or ED visits.  She continues to work at a [employer], lives by herself but has support from her children.  No falls at home.  Totally functional and activities of daily living.  Manages her own medications.  Denies any side effects to medicines.  She has had no changes in her medicines in many years.  No chest pain or shortness of breath with activity.  Low bit of weight gain lately, about 5 pounds in the last year.  Reports eating more sweets than usual, has been enjoying a prepackaged appetite every day, which she agrees she probably needs to change.    Objective:      BP 120/62   Pulse 71   Wt 192 lb (87.1 kg)   LMP 04/10/1996   SpO2 98%   BMI 31.95 kg/m    Physical Exam  Gen: Well-appearing Neck: Normal thyroid Heart: Regular, no murmur Lungs: Unlabored, clear throughout Ext: Warm, no edema, mild osteoarthritis in both hands, no synovitis  Results for orders placed or performed in visit on 03/20/24  POCT glycosylated hemoglobin (Hb A1C)  Result Value Ref Range   Hemoglobin A1C 7.2 (A) 4.0 - 5.6 %   HbA1c POC (<> result, manual entry)     HbA1c, POC (prediabetic range)     HbA1c, POC (controlled diabetic range)        Assessment & Plan:   Problem List Items Addressed This Visit       High   Type 2 diabetes mellitus with other specified complication (HCC) - Primary (Chronic)   Chronic and stable.  A1c is a little up today at 7.2%.  She is on a historic dosing regimen of Actos , has been using this for many years.  We talked about options of  changing.  She wants to make some nutrition changes, she feels she can cut out many of the sweets that she has been eating lately.  Check urine microalbumin today.  Follow-up with me in 3 months.      Relevant Medications   pioglitazone  (ACTOS ) 30 MG tablet   simvastatin  (ZOCOR ) 40 MG tablet   Other Relevant Orders   POCT glycosylated hemoglobin (Hb A1C) (Completed)   Basic metabolic panel with GFR   Microalbumin / creatinine urine ratio   Essential hypertension (Chronic)   Chronic and stable.  Blood pressure well-controlled today.  Plan to continue chlorthalidone .  Check renal function today.  She has declined to transition to ARB in the past.       Relevant Medications   chlorthalidone  (HYGROTON ) 25 MG tablet   simvastatin  (ZOCOR ) 40 MG tablet   Other Relevant Orders   Basic metabolic panel with GFR     Medium    Stage 3a chronic kidney disease (CKD) (HCC) (Chronic)   Chronic and stable.  Check renal function today.  I still want to transition to an ARB and SGLT2 inhibitor, but patient is making any medication changes due to fear of side effects.  Relevant Orders   Basic metabolic panel with GFR     Unprioritized   Localized osteoarthritis of hand, right   Symptoms of discomfort at the base of the right thumb are consistent with osteoarthritis.  We talked about reasonable expectations.  I prescribed topical NSAID.      Other Visit Diagnoses       Controlled type 2 diabetes mellitus without complication, without long-term current use of insulin  (HCC)       Relevant Medications   pioglitazone  (ACTOS ) 30 MG tablet   simvastatin  (ZOCOR ) 40 MG tablet     Dyslipidemia  (Chronic)      Relevant Medications   simvastatin  (ZOCOR ) 40 MG tablet       Return in about 3 months (around 06/20/2024) for diabetes management.    Ether Hercules, MD

## 2024-03-20 NOTE — Assessment & Plan Note (Signed)
 Chronic and stable.  Check renal function today.  I still want to transition to an ARB and SGLT2 inhibitor, but patient is making any medication changes due to fear of side effects.

## 2024-03-20 NOTE — Assessment & Plan Note (Signed)
 Symptoms of discomfort at the base of the right thumb are consistent with osteoarthritis.  We talked about reasonable expectations.  I prescribed topical NSAID.

## 2024-03-20 NOTE — Assessment & Plan Note (Signed)
 Chronic and stable.  A1c is a little up today at 7.2%.  She is on a historic dosing regimen of Actos , has been using this for many years.  We talked about options of changing.  She wants to make some nutrition changes, she feels she can cut out many of the sweets that she has been eating lately.  Check urine microalbumin today.  Follow-up with me in 3 months.

## 2024-03-20 NOTE — Assessment & Plan Note (Signed)
 Chronic and stable.  Blood pressure well-controlled today.  Plan to continue chlorthalidone .  Check renal function today.  She has declined to transition to ARB in the past.

## 2024-03-21 ENCOUNTER — Encounter: Payer: Self-pay | Admitting: Student in an Organized Health Care Education/Training Program

## 2024-04-28 ENCOUNTER — Encounter (HOSPITAL_COMMUNITY): Payer: Self-pay

## 2024-04-28 ENCOUNTER — Ambulatory Visit (INDEPENDENT_AMBULATORY_CARE_PROVIDER_SITE_OTHER)

## 2024-04-28 ENCOUNTER — Ambulatory Visit (HOSPITAL_COMMUNITY)
Admission: EM | Admit: 2024-04-28 | Discharge: 2024-04-28 | Disposition: A | Discharge status: Home / Self Care | Attending: Emergency Medicine | Admitting: Emergency Medicine

## 2024-04-28 DIAGNOSIS — J189 Pneumonia, unspecified organism: Secondary | ICD-10-CM

## 2024-04-28 DIAGNOSIS — R051 Acute cough: Secondary | ICD-10-CM

## 2024-04-28 MED ORDER — AZITHROMYCIN 250 MG PO TABS: ORAL_TABLET | ORAL | 0 refills | Status: DC

## 2024-04-28 MED ORDER — BENZONATATE 100 MG PO CAPS: 100.0000 mg | ORAL_CAPSULE | Freq: Three times a day (TID) | ORAL | 0 refills | Status: DC

## 2024-04-28 MED ORDER — AMOXICILLIN-POT CLAVULANATE 875-125 MG PO TABS: 1.0000 | ORAL_TABLET | Freq: Two times a day (BID) | ORAL | 0 refills | Status: DC

## 2024-04-28 NOTE — Discharge Instructions (Addendum)
 Your chest x-ray did show some right lower than pneumonia. Start taking Augmentin  twice daily for 7 days. Start taking azithromycin  by taking 2 tablets on the first day and 1 tablet on the for remaining days. Take Tessalon  every 8 hours as needed for cough. Otherwise you can take over-the-counter Mucinex  to help with your cough and congestion Take 650 mg of Tylenol  every 6-8 hours as needed for any pain or fever. Avoid taking ibuprofen and aspirin  as this could worsen your kidney disease. Make sure you are staying hydrated and getting plenty of rest Return here if her symptoms persist or worsen.

## 2024-04-28 NOTE — ED Provider Notes (Signed)
 [facility]    CSN: [id number] Arrival date & time: 04/28/24  0849      History   Chief Complaint Chief Complaint  Patient presents with   Cough    HPI [name] is a 78 y.o. female.   Patient presents with productive cough with dark-colored sputum, chest congestion, chest tightness, headache, sore throat, nasal congestion, fever, body aches, chills, and fatigue that began on 6/13.  Denies chest pain, shortness of breath, nausea, vomiting, diarrhea.  Patient reports that she has been taking ibuprofen and aspirin  with minimal relief of her symptoms.  Patient states that she is just here today to request a work note.  Of note patient does have history of hypertension, hyperlipidemia, type 2 diabetes, and stage III CKD.  The history is provided by the patient and medical records.  Cough   Past Medical History:  Diagnosis Date   Diabetes mellitus    GERD (gastroesophageal reflux disease)    HLD (hyperlipidemia)    Hypertension     Patient Active Problem List   Diagnosis Date Noted   Localized osteoarthritis of hand, right 03/20/2024   Stage 3a chronic kidney disease (CKD) (HCC) 12/25/2022   Diverticulosis 06/19/2022   Osteopenia 01/28/2018   Obesity (BMI 30.0-34.9) 01/28/2018   Routine health maintenance 03/20/2012   Type 2 diabetes mellitus with other specified complication (HCC) 09/06/2007   Hyperlipidemia associated with type 2 diabetes mellitus (HCC) 09/06/2007   Essential hypertension 09/06/2007    History reviewed. No pertinent surgical history.  OB History   No obstetric history on file.      Home Medications    Prior to Admission medications   Medication Sig Start Date End Date Taking? Authorizing Provider  amoxicillin -clavulanate (AUGMENTIN ) 875-125 MG tablet Take 1 tablet by mouth every 12 (twelve) hours. 04/28/24  Yes Rosevelt Constable, Christia Coaxum A, NP  azithromycin  (ZITHROMAX  Z-PAK) 250 MG tablet Take 2 pills (500mg ) first day and one pill  (250mg ) the remaining 4 days. 04/28/24  Yes Rosevelt Constable, Debbrah Sampedro A, NP  benzonatate  (TESSALON ) 100 MG capsule Take 1 capsule (100 mg total) by mouth every 8 (eight) hours. 04/28/24  Yes Rosevelt Constable, Anaiyah Anglemyer A, NP  chlorthalidone  (HYGROTON ) 25 MG tablet Take 1 tablet (25 mg total) by mouth daily. 03/20/24   Ether Hercules, MD  diclofenac  Sodium (VOLTAREN ) 1 % GEL Apply 2 g topically 2 (two) times daily as needed (pain in hands). 03/20/24   Ether Hercules, MD  pioglitazone  (ACTOS ) 30 MG tablet Take 1 tablet (30 mg total) by mouth daily. 03/20/24   Ether Hercules, MD  simvastatin  (ZOCOR ) 40 MG tablet Take 1 tablet (40 mg total) by mouth at bedtime. 03/20/24   Ether Hercules, MD    Family History Family History  Problem Relation Age of Onset   Stroke Neg Hx    Heart disease Neg Hx    Cancer Neg Hx     Social History Social History   Tobacco Use   Smoking status: Never   Smokeless tobacco: Never  Vaping Use   Vaping status: Never Used  Substance Use Topics   Alcohol use: No   Drug use: No     Allergies   Metformin   Review of Systems Review of Systems  Respiratory:  Positive for cough.    Per HPI  Physical Exam Triage Vital Signs ED Triage Vitals  Encounter Vitals Group     BP 04/28/24 0947 131/75     Girls Systolic BP Percentile --  Girls Diastolic BP Percentile --      Boys Systolic BP Percentile --      Boys Diastolic BP Percentile --      Pulse Rate 04/28/24 0945 69     Resp 04/28/24 0945 20     Temp 04/28/24 0945 99.2 F (37.3 C)     Temp Source 04/28/24 0945 Oral     SpO2 04/28/24 0945 97 %     Weight --      Height --      Head Circumference --      Peak Flow --      Pain Score 04/28/24 0946 0     Pain Loc --      Pain Education --      Exclude from Growth Chart --    No data found.  Updated Vital Signs BP 131/75 (BP Location: Left Arm)   Pulse 69   Temp 99.2 F (37.3 C) (Oral)   Resp 20   LMP 04/10/1996   SpO2 97%    Visual Acuity Right Eye Distance:   Left Eye Distance:   Bilateral Distance:    Right Eye Near:   Left Eye Near:    Bilateral Near:     Physical Exam Vitals and nursing note reviewed.  Constitutional:      General: She is awake. She is not in acute distress.    Appearance: Normal appearance. She is well-developed and well-groomed. She is not ill-appearing.  HENT:     Right Ear: Tympanic membrane, ear canal and external ear normal.     Left Ear: Tympanic membrane, ear canal and external ear normal.     Nose: Congestion and rhinorrhea present.     Mouth/Throat:     Mouth: Mucous membranes are moist.     Pharynx: Posterior oropharyngeal erythema and postnasal drip present. No oropharyngeal exudate.     Tonsils: No tonsillar exudate.   Cardiovascular:     Rate and Rhythm: Normal rate and regular rhythm.  Pulmonary:     Effort: Pulmonary effort is normal.     Breath sounds: Normal breath sounds.   Skin:    General: Skin is warm and dry.   Neurological:     Mental Status: She is alert.   Psychiatric:        Behavior: Behavior is cooperative.      UC Treatments / Results  Labs (all labs ordered are listed, but only abnormal results are displayed) Labs Reviewed - No data to display  EKG   Radiology DG Chest 2 View Result Date: 04/28/2024 CLINICAL DATA:  Productive cough EXAM: CHEST - 2 VIEW COMPARISON:  Sullivan 17, 2021 FINDINGS: Subtle right basilar infiltrates without consolidation Left lung clear Heart and mediastinum normal No pleural effusions IMPRESSION: Subtle right lower lobe infiltrates without consolidations Electronically Signed   By: Fredrich Jefferson M.D.   On: 04/28/2024 10:42    Procedures Procedures (including critical care time)  Medications Ordered in UC Medications - No data to display  Initial Impression / Assessment and Plan / UC Course  I have reviewed the triage vital signs and the nursing notes.  Pertinent labs & imaging results that were  available during my care of the patient were reviewed by me and considered in my medical decision making (see chart for details).     Patient is well-appearing.  Vitals are stable.  Temperature slightly elevated at 99.2.  Upon assessment congestion rhinorrhea present, mild erythema and PND noted to pharynx.  Lungs clear bilaterally to auscultation.  Ordered chest x-ray.  Patient my interpretation there appears to be infiltrates of the right lower lobe consistent with pneumonia.  Radiology report confirms this.  Prescribed Augmentin  and azithromycin  for pneumonia coverage.  Prescribed Tessalon  as needed for cough.  Discussed avoiding use of ibuprofen due to history of kidney disease.  Discussed over-the-counter medications as needed for symptoms.  Discussed return precautions. Final Clinical Impressions(s) / UC Diagnoses   Final diagnoses:  Acute cough  Pneumonia of right lower lobe due to infectious organism     Discharge Instructions      Your chest x-ray did show some right lower than pneumonia. Start taking Augmentin  twice daily for 7 days. Start taking azithromycin  by taking 2 tablets on the first day and 1 tablet on the for remaining days. Take Tessalon  every 8 hours as needed for cough. Otherwise you can take over-the-counter Mucinex  to help with your cough and congestion Take 650 mg of Tylenol  every 6-8 hours as needed for any pain or fever. Avoid taking ibuprofen and aspirin  as this could worsen your kidney disease. Make sure you are staying hydrated and getting plenty of rest Return here if her symptoms persist or worsen.     ED Prescriptions     Medication Sig Dispense Auth. Provider   amoxicillin -clavulanate (AUGMENTIN ) 875-125 MG tablet Take 1 tablet by mouth every 12 (twelve) hours. 14 tablet Levora Reas A, NP   azithromycin  (ZITHROMAX  Z-PAK) 250 MG tablet Take 2 pills (500mg ) first day and one pill (250mg ) the remaining 4 days. 6 tablet Levora Reas A, NP    benzonatate  (TESSALON ) 100 MG capsule Take 1 capsule (100 mg total) by mouth every 8 (eight) hours. 21 capsule Levora Reas A, NP      PDMP not reviewed this encounter.   Levora Reas A, NP 04/28/24 1059

## 2024-04-28 NOTE — ED Triage Notes (Addendum)
 Pt c/o productive cough with black sputum, chest tightness, and headache since Friday. States took ibuprofen and asa with little relief. Pt states needs a work note.

## 2024-05-20 ENCOUNTER — Encounter (HOSPITAL_COMMUNITY): Payer: Self-pay

## 2024-05-20 ENCOUNTER — Ambulatory Visit (INDEPENDENT_AMBULATORY_CARE_PROVIDER_SITE_OTHER): Admitting: *Deleted

## 2024-05-20 ENCOUNTER — Ambulatory Visit (INDEPENDENT_AMBULATORY_CARE_PROVIDER_SITE_OTHER)

## 2024-05-20 ENCOUNTER — Ambulatory Visit (HOSPITAL_COMMUNITY)
Admission: EM | Admit: 2024-05-20 | Discharge: 2024-05-20 | Disposition: A | Discharge status: Home / Self Care | Attending: Emergency Medicine | Admitting: Emergency Medicine

## 2024-05-20 DIAGNOSIS — M25572 Pain in left ankle and joints of left foot: Secondary | ICD-10-CM | POA: Diagnosis not present

## 2024-05-20 DIAGNOSIS — S93402A Sprain of unspecified ligament of left ankle, initial encounter: Secondary | ICD-10-CM

## 2024-05-20 DIAGNOSIS — Z0000 Encounter for general adult medical examination without abnormal findings: Secondary | ICD-10-CM

## 2024-05-20 MED ORDER — MELOXICAM 7.5 MG PO TABS: 7.5000 mg | ORAL_TABLET | Freq: Every day | ORAL | 0 refills | Status: AC

## 2024-05-20 MED ORDER — METHYLPREDNISOLONE SODIUM SUCC 125 MG IJ SOLR
60.0000 mg | Freq: Once | INTRAMUSCULAR | Status: AC
  Administered 2024-05-20: 60 mg via INTRAMUSCULAR

## 2024-05-20 MED ORDER — METHYLPREDNISOLONE SODIUM SUCC 125 MG IJ SOLR
INTRAMUSCULAR | Status: AC
  Filled 2024-05-20: qty 2

## 2024-05-20 NOTE — Patient Instructions (Signed)
 [name] , Thank you for taking time to come for your Medicare Wellness Visit. I appreciate your ongoing commitment to your health goals. Please review the following plan we discussed and let me know if I can assist you in the future.   Screening recommendations/referrals: Colonoscopy: no longer required Mammogram: up to date Bone Density: education provided Recommended yearly ophthalmology/optometry visit for glaucoma screening and checkup Recommended yearly dental visit for hygiene and checkup  Vaccinations: Influenza vaccine: up to date Pneumococcal vaccine: up to date Tdap vaccine: up to date Shingles vaccine: up to date       Preventive Care 65 Years and Older, Female Preventive care refers to lifestyle choices and visits with your health care provider that can promote health and wellness. What does preventive care include? A yearly physical exam. This is also called an annual well check. Dental exams once or twice a year. Routine eye exams. Ask your health care provider how often you should have your eyes checked. Personal lifestyle choices, including: Daily care of your teeth and gums. Regular physical activity. Eating a healthy diet. Avoiding tobacco and drug use. Limiting alcohol use. Practicing safe sex. Taking low-dose aspirin  every day. Taking vitamin and mineral supplements as recommended by your health care provider. What happens during an annual well check? The services and screenings done by your health care provider during your annual well check will depend on your age, overall health, lifestyle risk factors, and family history of disease. Counseling  Your health care provider may ask you questions about your: Alcohol use. Tobacco use. Drug use. Emotional well-being. Home and relationship well-being. Sexual activity. Eating habits. History of falls. Memory and ability to understand (cognition). Work and work Astronomer. Reproductive  health. Screening  You may have the following tests or measurements: Height, weight, and BMI. Blood pressure. Lipid and cholesterol levels. These may be checked every 5 years, or more frequently if you are over 15 years old. Skin check. Lung cancer screening. You may have this screening every year starting at age 104 if you have a 30-pack-year history of smoking and currently smoke or have quit within the past 15 years. Fecal occult blood test (FOBT) of the stool. You may have this test every year starting at age 70. Flexible sigmoidoscopy or colonoscopy. You may have a sigmoidoscopy every 5 years or a colonoscopy every 10 years starting at age 89. Hepatitis C blood test. Hepatitis B blood test. Sexually transmitted disease (STD) testing. Diabetes screening. This is done by checking your blood sugar (glucose) after you have not eaten for a while (fasting). You may have this done every 1-3 years. Bone density scan. This is done to screen for osteoporosis. You may have this done starting at age 73. Mammogram. This may be done every 1-2 years. Talk to your health care provider about how often you should have regular mammograms. Talk with your health care provider about your test results, treatment options, and if necessary, the need for more tests. Vaccines  Your health care provider may recommend certain vaccines, such as: Influenza vaccine. This is recommended every year. Tetanus, diphtheria, and acellular pertussis (Tdap, Td) vaccine. You may need a Td booster every 10 years. Zoster vaccine. You may need this after age 89. Pneumococcal 13-valent conjugate (PCV13) vaccine. One dose is recommended after age 83. Pneumococcal polysaccharide (PPSV23) vaccine. One dose is recommended after age 67. Talk to your health care provider about which screenings and vaccines you need and how often you need them. This  information is not intended to replace advice given to you by your health care provider.  Make sure you discuss any questions you have with your health care provider. Document Released: 11/26/2015 Document Revised: 07/19/2016 Document Reviewed: 08/31/2015 Elsevier Interactive Patient Education  2017 [employer].  Fall Prevention in the Home Falls can cause injuries. They can happen to people of all ages. There are many things you can do to make your home safe and to help prevent falls. What can I do on the outside of my home? Regularly fix the edges of walkways and driveways and fix any cracks. Remove anything that might make you trip as you walk through a door, such as a raised step or threshold. Trim any bushes or trees on the path to your home. Use bright outdoor lighting. Clear any walking paths of anything that might make someone trip, such as rocks or tools. Regularly check to see if handrails are loose or broken. Make sure that both sides of any steps have handrails. Any raised decks and porches should have guardrails on the edges. Have any leaves, snow, or ice cleared regularly. Use sand or salt on walking paths during winter. Clean up any spills in your garage right away. This includes oil or grease spills. What can I do in the bathroom? Use night lights. Install grab bars by the toilet and in the tub and shower. Do not use towel bars as grab bars. Use non-skid mats or decals in the tub or shower. If you need to sit down in the shower, use a plastic, non-slip stool. Keep the floor dry. Clean up any water that spills on the floor as soon as it happens. Remove soap buildup in the tub or shower regularly. Attach bath mats securely with double-sided non-slip rug tape. Do not have throw rugs and other things on the floor that can make you trip. What can I do in the bedroom? Use night lights. Make sure that you have a light by your bed that is easy to reach. Do not use any sheets or blankets that are too big for your bed. They should not hang down onto the floor. Have a  firm chair that has side arms. You can use this for support while you get dressed. Do not have throw rugs and other things on the floor that can make you trip. What can I do in the kitchen? Clean up any spills right away. Avoid walking on wet floors. Keep items that you use a lot in easy-to-reach places. If you need to reach something above you, use a strong step stool that has a grab bar. Keep electrical cords out of the way. Do not use floor polish or wax that makes floors slippery. If you must use wax, use non-skid floor wax. Do not have throw rugs and other things on the floor that can make you trip. What can I do with my stairs? Do not leave any items on the stairs. Make sure that there are handrails on both sides of the stairs and use them. Fix handrails that are broken or loose. Make sure that handrails are as long as the stairways. Check any carpeting to make sure that it is firmly attached to the stairs. Fix any carpet that is loose or worn. Avoid having throw rugs at the top or bottom of the stairs. If you do have throw rugs, attach them to the floor with carpet tape. Make sure that you have a light switch at the top  of the stairs and the bottom of the stairs. If you do not have them, ask someone to add them for you. What else can I do to help prevent falls? Wear shoes that: Do not have high heels. Have rubber bottoms. Are comfortable and fit you well. Are closed at the toe. Do not wear sandals. If you use a stepladder: Make sure that it is fully opened. Do not climb a closed stepladder. Make sure that both sides of the stepladder are locked into place. Ask someone to hold it for you, if possible. Clearly mark and make sure that you can see: Any grab bars or handrails. First and last steps. Where the edge of each step is. Use tools that help you move around (mobility aids) if they are needed. These include: Canes. Walkers. Scooters. Crutches. Turn on the lights when you  go into a dark area. Replace any light bulbs as soon as they burn out. Set up your furniture so you have a clear path. Avoid moving your furniture around. If any of your floors are uneven, fix them. If there are any pets around you, be aware of where they are. Review your medicines with your doctor. Some medicines can make you feel dizzy. This can increase your chance of falling. Ask your doctor what other things that you can do to help prevent falls. This information is not intended to replace advice given to you by your health care provider. Make sure you discuss any questions you have with your health care provider. Document Released: 08/26/2009 Document Revised: 04/06/2016 Document Reviewed: 12/04/2014 Elsevier Interactive Patient Education  2017 [employer].

## 2024-05-20 NOTE — Progress Notes (Signed)
 Subjective:   [name] is a 78 y.o. female who presents for Medicare Annual (Subsequent) preventive examination.  Visit Complete: Virtual I connected with  [name] on 05/20/24 by a audio enabled telemedicine application and verified that I am speaking with the correct person using two identifiers.  Patient Location: Home  Provider Location: Home Office  I discussed the limitations of evaluation and management by telemedicine. The patient expressed understanding and agreed to proceed.  Vital Signs: Because this visit was a virtual/telehealth visit, some criteria may be missing or patient reported. Any vitals not documented were not able to be obtained and vitals that have been documented are patient reported.  Cardiac Risk Factors include: advanced age (>42men, >42 women);diabetes mellitus     Objective:    Today's Vitals   05/20/24 1436  PainSc: 3    There is no height or weight on file to calculate BMI.     05/20/2024    2:37 PM 05/28/2023   12:04 PM 12/25/2022   11:47 AM 11/17/2022    3:32 PM 11/17/2022   11:09 AM 06/19/2022    9:27 AM 06/07/2022    4:10 AM  Advanced Directives  Does Patient Have a Medical Advance Directive? No No No No No No No  Would patient like information on creating a medical advance directive? No - Patient declined No - Patient declined No - Patient declined   No - Patient declined No - Patient declined    Current Medications (verified) Outpatient Encounter Medications as of 05/20/2024  Medication Sig   chlorthalidone  (HYGROTON ) 25 MG tablet Take 1 tablet (25 mg total) by mouth daily.   diclofenac  Sodium (VOLTAREN ) 1 % GEL Apply 2 g topically 2 (two) times daily as needed (pain in hands).   meloxicam  (MOBIC ) 7.5 MG tablet Take 1 tablet (7.5 mg total) by mouth daily for 5 days.   pioglitazone  (ACTOS ) 30 MG tablet Take 1 tablet (30 mg total) by mouth daily.   simvastatin  (ZOCOR ) 40 MG tablet Take 1 tablet (40 mg total) by mouth at bedtime.    [EXPIRED] methylPREDNISolone  sodium succinate (SOLU-MEDROL ) 125 mg/2 mL injection 60 mg    No facility-administered encounter medications on file as of 05/20/2024.    Allergies (verified) Metformin   History: Past Medical History:  Diagnosis Date   Diabetes mellitus    GERD (gastroesophageal reflux disease)    HLD (hyperlipidemia)    Hypertension    No past surgical history on file. Family History  Problem Relation Age of Onset   Stroke Neg Hx    Heart disease Neg Hx    Cancer Neg Hx    Social History   Socioeconomic History   Marital status: Single    Spouse name: Not on file   Number of children: Not on file   Years of education: 9   Highest education level: Not on file  Occupational History    Employer: [employer]  Tobacco Use   Smoking status: Never   Smokeless tobacco: Never  Vaping Use   Vaping status: Never Used  Substance and Sexual Activity   Alcohol use: No   Drug use: No   Sexual activity: Not on file  Other Topics Concern   Not on file  Social History Narrative   Not on file   Social Drivers of Health   Financial Resource Strain: Low Risk  (05/20/2024)   Overall Financial Resource Strain (CARDIA)    Difficulty of Paying Living Expenses: Not hard  at all  Food Insecurity: No Food Insecurity (05/20/2024)   Hunger Vital Sign    Worried About Running Out of Food in the Last Year: Never true    Ran Out of Food in the Last Year: Never true  Transportation Needs: No Transportation Needs (05/20/2024)   PRAPARE - Administrator, Civil Service (Medical): No    Lack of Transportation (Non-Medical): No  Physical Activity: Inactive (05/20/2024)   Exercise Vital Sign    Days of Exercise per Week: 0 days    Minutes of Exercise per Session: 0 min  Stress: No Stress Concern Present (05/20/2024)   [employer] of Occupational Health - Occupational Stress Questionnaire    Feeling of Stress: Not at all  Social Connections: Unknown (05/20/2024)    Social Connection and Isolation Panel    Frequency of Communication with Friends and Family: More than three times a week    Frequency of Social Gatherings with Friends and Family: Three times a week    Attends Religious Services: 1 to 4 times per year    Active Member of Clubs or Organizations: No    Attends Engineer, structural: Never    Marital Status: Not on file    Tobacco Counseling Counseling given: Not Answered   Clinical Intake:  Pre-visit preparation completed: Yes  Pain : 0-10 Pain Score: 3  Pain Type: Acute pain Pain Location: Ankle Pain Descriptors / Indicators: Aching, Burning, Dull Pain Onset: In the past 7 days     Diabetes: Yes CBG done?: No Did pt. bring in CBG monitor from home?: No  How often do you need to have someone help you when you read instructions, pamphlets, or other written materials from your doctor or pharmacy?: 1 - Never  Interpreter Needed?: No  Information entered by :: Mliss Graff LPN   Activities of Daily Living    05/20/2024    2:39 PM  In your present state of health, do you have any difficulty performing the following activities:  Hearing? 0  Vision? 0  Difficulty concentrating or making decisions? 0  Walking or climbing stairs? 0  Dressing or bathing? 0  Preparing Food and eating ? N  Using the Toilet? N  In the past six months, have you accidently leaked urine? N  Do you have problems with loss of bowel control? N  Managing your Medications? N  Managing your Finances? N  Housekeeping or managing your Housekeeping? N    Patient Care Team: Jerrell Cleatus Ned, MD as PCP - General (Internal Medicine) Marvene Ingle as Consulting Physician (Optometry)  Indicate any recent Medical Services you may have received from other than [facility] providers in the past year (date may be approximate).     Assessment:   This is a routine wellness examination for [name].  Hearing/Vision screen Hearing Screening - Comments::  No trouble hearing Vision Screening - Comments:: Up to date [facility]   Goals Addressed             This Visit's Progress    Eat more fruits and vegetables   On track    Patient Stated       Stay healthy       Depression Screen    05/20/2024    2:44 PM 03/20/2024   10:19 AM 05/28/2023   12:04 PM 12/25/2022   11:49 AM 06/19/2022    9:27 AM 01/30/2022    1:33 PM 03/14/2021   10:17 AM  PHQ 2/9 Scores  PHQ -  2 Score 0 0 0 0 0 0 0  PHQ- 9 Score 0 0         Fall Risk    05/20/2024    2:37 PM 05/28/2023   12:03 PM 12/25/2022   11:48 AM 06/19/2022    9:36 AM 01/30/2022   10:50 AM  Fall Risk   Falls in the past year? 0 0 0 0 0  Number falls in past yr: 0 0 0 0 0  Injury with Fall? 0 0 0 0 0  Risk for fall due to :    No Fall Risks   Follow up Falls evaluation completed;Education provided;Falls prevention discussed Falls evaluation completed Falls evaluation completed Falls evaluation completed  Falls evaluation completed      Data saved with a previous flowsheet row definition    MEDICARE RISK AT HOME: Medicare Risk at Home Any stairs in or around the home?: Yes If so, are there any without handrails?: No Home free of loose throw rugs in walkways, pet beds, electrical cords, etc?: Yes Adequate lighting in your home to reduce risk of falls?: Yes Life alert?: No Use of a cane, walker or w/c?: No Grab bars in the bathroom?: No Shower chair or bench in shower?: No Elevated toilet seat or a handicapped toilet?: No  TIMED UP AND GO:  Was the test performed?  No    Cognitive Function:        05/20/2024    2:40 PM  6CIT Screen  What Year? 0 points  What month? 0 points  What time? 0 points  Count back from 20 2 points  Months in reverse 4 points  Repeat phrase 6 points  Total Score 12 points    Immunizations Immunization History  Administered Date(s) Administered   Fluad Quad(high Dose 65+) 08/16/2021   Fluad Trivalent(High Dose 65+) 10/30/2023   Influenza Split  08/20/2012   Influenza Whole 09/12/2006, 09/06/2007, 08/06/2008, 09/09/2009, 09/08/2010, 07/04/2011   Influenza,inj,Quad PF,6+ Mos 07/22/2013, 08/05/2014, 09/16/2015, 08/08/2016, 07/24/2017, 08/05/2018, 09/10/2019, 10/19/2020, 08/08/2022   PFIZER(Purple Top)SARS-COV-2 Vaccination 03/12/2020, 04/13/2020   PPD Test 03/08/2018   Pneumococcal Conjugate-13 04/27/2015   Pneumococcal Polysaccharide-23 07/22/2013   Tdap 06/04/2017   Zoster Recombinant(Shingrix) 09/18/2019, 10/29/2020    TDAP status: Up to date  Flu Vaccine status: Up to date  Pneumococcal vaccine status: Up to date  Covid-19 vaccine status: Information provided on how to obtain vaccines.   Qualifies for Shingles Vaccine? No   Zostavax completed Yes   Shingrix Completed?: Yes  Screening Tests Health Maintenance  Topic Date Due   OPHTHALMOLOGY EXAM  03/15/2016   COVID-19 Vaccine (3 - 2024-25 season) 07/15/2023   INFLUENZA VACCINE  06/13/2024   HEMOGLOBIN A1C  09/20/2024   MAMMOGRAM  10/17/2024   Diabetic kidney evaluation - eGFR measurement  03/20/2025   Diabetic kidney evaluation - Urine ACR  03/20/2025   FOOT EXAM  03/20/2025   Medicare Annual Wellness (AWV)  05/20/2025   Colonoscopy  06/09/2026   DTaP/Tdap/Td (2 - Td or Tdap) 06/05/2027   Pneumococcal Vaccine: 50+ Years  Completed   DEXA SCAN  Completed   Hepatitis C Screening  Completed   Zoster Vaccines- Shingrix  Completed   Hepatitis B Vaccines  Aged Out   HPV VACCINES  Aged Out   Meningococcal B Vaccine  Aged Out    Health Maintenance  Health Maintenance Due  Topic Date Due   OPHTHALMOLOGY EXAM  03/15/2016   COVID-19 Vaccine (3 - 2024-25 season) 07/15/2023  Colonoscopy no longer due to age  Mammogram status: Completed  . Repeat every year  Bone Density status: Completed 2019. Results reflect: Bone density results: NORMAL. Repeat every 0 years.  Lung Cancer Screening: (Low Dose CT Chest recommended if Age 19-80 years, 20 pack-year currently  smoking OR have quit w/in 15years.) does not qualify.   Lung Cancer Screening Referral:   Additional Screening:  Hepatitis C Screening: does not qualify; Completed 2013  Vision Screening: Recommended annual ophthalmology exams for early detection of glaucoma and other disorders of the eye. Is the patient up to date with their annual eye exam?  Yes  Who is the provider or what is the name of the office in which the patient attends annual eye exams? [facility] If pt is not established with a provider, would they like to be referred to a provider to establish care? No .   Dental Screening: Recommended annual dental exams for proper oral hygiene Nutrition Risk Assessment:  Has the patient had any N/V/D within the last 2 months?  No  Does the patient have any non-healing wounds?  No  Has the patient had any unintentional weight loss or weight gain?  No   Diabetes:  Is the patient diabetic?  Yes  If diabetic, was a CBG obtained today?  No  Did the patient bring in their glucometer from home?  No  How often do you monitor your CBG's? Does not check.   Financial Strains and Diabetes Management:  Are you having any financial strains with the device, your supplies or your medication? No .  Does the patient want to be seen by Chronic Care Management for management of their diabetes?  No  Would the patient like to be referred to a Nutritionist or for Diabetic Management?  No   Diabetic Exams:  Diabetic Eye Exam: . Pt has been advised about the importance in completing this exam  Diabetic Foot Exam: . Pt has been advised about the importance in completing this exam. .    Community Resource Referral / Chronic Care Management: CRR required this visit?  No   CCM required this visit?  No     Plan:     I have personally reviewed and noted the following in the patient's chart:   Medical and social history Use of alcohol, tobacco or illicit drugs  Current medications and supplements  including opioid prescriptions. Patient is not currently taking opioid prescriptions. Functional ability and status Nutritional status Physical activity Advanced directives List of other physicians Hospitalizations, surgeries, and ER visits in previous 12 months Vitals Screenings to include cognitive, depression, and falls Referrals and appointments  In addition, I have reviewed and discussed with patient certain preventive protocols, quality metrics, and best practice recommendations. A written personalized care plan for preventive services as well as general preventive health recommendations were provided to patient.     Mliss Graff, LPN   12/14/7972   After Visit Summary: (MyChart) Due to this being a telephonic visit, the after visit summary with patients personalized plan was offered to patient via MyChart   Nurse Notes: \

## 2024-05-20 NOTE — ED Provider Notes (Signed)
 [facility]    CSN: [id number] Arrival date & time: 05/20/24  0813     History   Chief Complaint Chief Complaint  Patient presents with   Ankle Pain   Joint Swelling    HPI [name] is a 78 y.o. female.  Yesterday developed left ankle pain and swelling She thinks she rolled it  Pain with weight bearing today Has not taken any medications yet  She reports possible break in this ankle 6 years ago - was in the boot for 6 weeks  History of T2 diabetes and CKD  Past Medical History:  Diagnosis Date   Diabetes mellitus    GERD (gastroesophageal reflux disease)    HLD (hyperlipidemia)    Hypertension     Patient Active Problem List   Diagnosis Date Noted   Localized osteoarthritis of hand, right 03/20/2024   Stage 3a chronic kidney disease (CKD) (HCC) 12/25/2022   Diverticulosis 06/19/2022   Osteopenia 01/28/2018   Obesity (BMI 30.0-34.9) 01/28/2018   Routine health maintenance 03/20/2012   Type 2 diabetes mellitus with other specified complication (HCC) 09/06/2007   Hyperlipidemia associated with type 2 diabetes mellitus (HCC) 09/06/2007   Essential hypertension 09/06/2007    History reviewed. No pertinent surgical history.  OB History   No obstetric history on file.      Home Medications    Prior to Admission medications   Medication Sig Start Date End Date Taking? Authorizing Provider  meloxicam  (MOBIC ) 7.5 MG tablet Take 1 tablet (7.5 mg total) by mouth daily for 5 days. 05/20/24 05/25/24 Yes Scharlene Catalina, [place], PA-C  chlorthalidone  (HYGROTON ) 25 MG tablet Take 1 tablet (25 mg total) by mouth daily. 03/20/24   Jerrell Cleatus Ned, MD  diclofenac  Sodium (VOLTAREN ) 1 % GEL Apply 2 g topically 2 (two) times daily as needed (pain in hands). 03/20/24   Jerrell Cleatus Ned, MD  pioglitazone  (ACTOS ) 30 MG tablet Take 1 tablet (30 mg total) by mouth daily. 03/20/24   Jerrell Cleatus Ned, MD  simvastatin  (ZOCOR ) 40 MG tablet Take 1 tablet (40 mg  total) by mouth at bedtime. 03/20/24   Jerrell Cleatus Ned, MD    Family History Family History  Problem Relation Age of Onset   Stroke Neg Hx    Heart disease Neg Hx    Cancer Neg Hx     Social History Social History   Tobacco Use   Smoking status: Never   Smokeless tobacco: Never  Vaping Use   Vaping status: Never Used  Substance Use Topics   Alcohol use: No   Drug use: No     Allergies   Metformin   Review of Systems Review of Systems As per HPI  Physical Exam Triage Vital Signs ED Triage Vitals [05/20/24 0842]  Encounter Vitals Group     BP 121/71     Girls Systolic BP Percentile      Girls Diastolic BP Percentile      Boys Systolic BP Percentile      Boys Diastolic BP Percentile      Pulse Rate (!) 58     Resp 16     Temp 98.5 F (36.9 C)     Temp Source Oral     SpO2 97 %     Weight      Height      Head Circumference      Peak Flow      Pain Score      Pain Loc  Pain Education      Exclude from Growth Chart    No data found.  Updated Vital Signs BP 121/71 (BP Location: Left Arm)   Pulse (!) 58   Temp 98.5 F (36.9 C) (Oral)   Resp 16   LMP 04/10/1996   SpO2 97%    Physical Exam Vitals and nursing note reviewed.  Constitutional:      General: She is not in acute distress. HENT:     Mouth/Throat:     Pharynx: Oropharynx is clear.  Cardiovascular:     Rate and Rhythm: Normal rate and regular rhythm.     Pulses: Normal pulses.  Pulmonary:     Effort: Pulmonary effort is normal.  Musculoskeletal:     Cervical back: Normal range of motion.     Left ankle: Swelling (mild) present. No deformity. Tenderness present. Normal range of motion.     Left Achilles Tendon: Normal.     Left foot: Normal.  Skin:    Capillary Refill: Capillary refill takes less than 2 seconds.  Neurological:     Mental Status: She is alert and oriented to person, place, and time.     Comments: Sensation normal. Cap refill < 2 seconds in the toes. Full  ROM of ankle. Tenderness medial and lateral malleolus with mild swelling medially. No mid foot bony tenderness      UC Treatments / Results  Labs (all labs ordered are listed, but only abnormal results are displayed) Labs Reviewed - No data to display  EKG  Radiology DG Ankle Complete Left Result Date: 05/20/2024 CLINICAL DATA:  pain and swelling unknown injury EXAM: LEFT ANKLE COMPLETE - 3+ VIEW COMPARISON:  None Available. FINDINGS: No acute fracture or dislocation. Well corticated ossific fragment subjacent to the medial malleolus, likely due to remote trauma. No ankle mortise widening. The talar dome is intact. There is no evidence of arthropathy or other focal bone abnormality. Soft tissues are unremarkable. IMPRESSION: No acute fracture or dislocation. Electronically Signed   By: Rogelia Myers M.D.   On: 05/20/2024 10:24    Procedures Procedures  Medications Ordered in UC Medications  methylPREDNISolone  sodium succinate (SOLU-MEDROL ) 125 mg/2 mL injection 60 mg (60 mg Intramuscular Given 05/20/24 1010)    Initial Impression / Assessment and Plan / UC Course  I have reviewed the triage vital signs and the nursing notes.  Pertinent labs & imaging results that were available during my care of the patient were reviewed by me and considered in my medical decision making (see chart for details).  Last A1c 2 months ago was 7.2 CMP with GFR 49, Cr was stable 1.08 Discussed pain control options with patient. I have recommended tylenol  but she reports it does not work for her. Will go ahead and give IM solumedrol for acute pain relief although discussed this will raise her blood glucose acutely.   Left ankle xray without acute abnormality.  Ankle brace is provided for support, compression.  Discussed RICE therapy at home.  With her stable creatinine 2 months ago, safe to use low-dose short-term Mobic .  7.5 mg once daily for 5 days.  Have advised following up with orthopedics as  well.  Final Clinical Impressions(s) / UC Diagnoses   Final diagnoses:  Acute left ankle pain  Sprain of left ankle, unspecified ligament, initial encounter     Discharge Instructions      Your xray does not have any bone abnormality.  You likely have sprained the ankle. Ice - apply  for 20 minutes a few times daily Compression - use the ankle brace for standing/walking Elevation - prop up on a pillow  The steroid injection given today will help with pain over the next several hours. Tomorrow morning, you can take one of the mobic  tablets. Continue this for about 5 days. Do NOT use other ibuprofen, Advil, naproxen , Aleve , etc. You CAN safely use tylenol .  Follow up with orthopedics if pain is persisting. Call to make an appointment.      ED Prescriptions     Medication Sig Dispense Auth. Provider   meloxicam  (MOBIC ) 7.5 MG tablet Take 1 tablet (7.5 mg total) by mouth daily for 5 days. 5 tablet Bryden Darden, [place], PA-C      PDMP not reviewed this encounter.   Jini Horiuchi, [place], PA-C 05/20/24 1053

## 2024-05-20 NOTE — Discharge Instructions (Addendum)
 Your xray does not have any bone abnormality.  You likely have sprained the ankle. Ice - apply for 20 minutes a few times daily Compression - use the ankle brace for standing/walking Elevation - prop up on a pillow  The steroid injection given today will help with pain over the next several hours. Tomorrow morning, you can take one of the mobic  tablets. Continue this for about 5 days. Do NOT use other ibuprofen, Advil, naproxen , Aleve , etc. You CAN safely use tylenol .  Follow up with orthopedics if pain is persisting. Call to make an appointment.

## 2024-05-20 NOTE — ED Triage Notes (Signed)
 Patient reports that she began having left ankle pain and yesterday began swelling with increased pain. Patient denies any injury. Pain is worse with weight bearing.   Patient denies any medications for her pain.

## 2024-06-19 ENCOUNTER — Encounter: Payer: Self-pay | Admitting: Student in an Organized Health Care Education/Training Program

## 2024-06-19 ENCOUNTER — Ambulatory Visit: Admitting: Student in an Organized Health Care Education/Training Program

## 2024-06-19 VITALS — BP 132/67 | HR 58 | Wt 195.0 lb

## 2024-06-19 DIAGNOSIS — I10 Essential (primary) hypertension: Secondary | ICD-10-CM

## 2024-06-19 DIAGNOSIS — E1169 Type 2 diabetes mellitus with other specified complication: Secondary | ICD-10-CM

## 2024-06-19 LAB — POCT GLYCOSYLATED HEMOGLOBIN (HGB A1C): Hemoglobin A1C: 6.9 % — AB (ref 4.0–5.6)

## 2024-06-19 MED ORDER — LOSARTAN POTASSIUM 50 MG PO TABS: 50.0000 mg | ORAL_TABLET | Freq: Every day | ORAL | 3 refills | Status: AC

## 2024-06-19 NOTE — Patient Instructions (Signed)
  VISIT SUMMARY: Today, you had a routine follow-up appointment to manage your diabetes, hypertension, and athlete's foot. We discussed your current health status, medications, and some lifestyle changes.  YOUR PLAN: -TYPE 2 DIABETES MELLITUS WITH OTHER SPECIFIED COMPLICATION: Your A1c level is 6.9%, which indicates good control of your diabetes. Continue with your current diabetes management plan, and make sure to schedule regular eye exams to screen for diabetic retinopathy, a condition that can affect your vision.  -ESSENTIAL HYPERTENSION: Your blood pressure is 132/67 mmHg. We discussed switching your medication from chlorthalidone  to losartan , which is better suited for managing hypertension in patients with diabetes. Please start taking losartan  as prescribed.  -TINEA PEDIS (ATHLETE'S FOOT), RIGHT FOOT (SUSPECTED): You have itching between your toes, which is likely athlete's foot. Continue using the antifungal cream as needed and let us  know if the condition does not improve.  INSTRUCTIONS: Please schedule an appointment with your eye doctor for a diabetic retinopathy screening. Continue with your current diabetes and hypertension medications, and start taking losartan  as prescribed. Monitor your athlete's foot and report any worsening symptoms.

## 2024-06-19 NOTE — Assessment & Plan Note (Signed)
 A1c has improved to 6.9% with no changes in diet or medication side effects. Continue the current diabetes management plan and encourage regular follow-up with an eye care specialist for diabetic retinopathy screening.  Currently using pioglitazone  30 mg daily, a historic regimen that seems to be working well for her.

## 2024-06-19 NOTE — Progress Notes (Signed)
 Established Patient Office Visit  Subjective   Patient ID: [name], female    DOB: February 02, 1946  Age: 78 y.o. MRN: [id number]  Chief Complaint  Patient presents with   Medical Management of Chronic Issues    3 Month Follow up for diabetes  Requested eye exam from [facility]     HPI  Discussed the use of AI scribe software for clinical note transcription with the patient, who gave verbal consent to proceed.  History of Present Illness MAIZEE REINHOLD is a 78 year old female with diabetes who presents for a routine follow-up.  Her A1c is currently 6.9%. She has not made significant dietary changes but tends to eat later in the day. She experiences no side effects from her diabetes medications and feels she is managing well.  She has symptoms of athlete's foot, including itching between her little toe and the adjacent toe. She applies a topical antifungal cream every morning, which alleviates the itching.  She has not had a recent eye examination and plans to schedule an appointment. She last saw an eye doctor, Dr. Eyvonne, who performed surgery on her eyes before the current year.  No chest pain or pressure during physical activity. She is conscious about her weight and is trying to eat less to lose weight, currently weighing around 190 pounds. She prefers dietary changes, such as eating more salads, over weight loss medications.  She wants a cosmetic procedure to remove a mark under her eyes, which she attributes to wearing glasses in her youth.      Objective:     BP 132/67 (BP Location: Left Arm, Cuff Size: Normal)   Pulse (!) 58   Wt 195 lb (88.5 kg)   LMP 04/10/1996   BMI 32.45 kg/m    Physical Exam  Gen: Well-appearing woman Heart: Regular, no murmur Lungs: Unlabored and clear Ext: Warm, no edema, patient declined to a foot exam Psych: Idiosyncratic, not anxious or depressed appearing   Results for orders placed or performed in visit on 06/19/24  POCT  glycosylated hemoglobin (Hb A1C)  Result Value Ref Range   Hemoglobin A1C 6.9 (A) 4.0 - 5.6 %   HbA1c POC (<> result, manual entry)     HbA1c, POC (prediabetic range)     HbA1c, POC (controlled diabetic range)        Assessment & Plan:    Problem List Items Addressed This Visit       High   Type 2 diabetes mellitus with other specified complication (HCC) - Primary (Chronic)   A1c has improved to 6.9% with no changes in diet or medication side effects. Continue the current diabetes management plan and encourage regular follow-up with an eye care specialist for diabetic retinopathy screening.  Currently using pioglitazone  30 mg daily, a historic regimen that seems to be working well for her.      Relevant Medications   losartan  (COZAAR ) 50 MG tablet   Other Relevant Orders   POCT glycosylated hemoglobin (Hb A1C) (Completed)   Essential hypertension (Chronic)   Blood pressure is 132/67 mmHg. Discussed switching from chlorthalidone  to losartan  due to diabetes and CKD. Switched from chlorthalidone  to losartan  for hypertension management.  Follow-up in 4 weeks for a lab visit for a BMP recheck.      Relevant Medications   losartan  (COZAAR ) 50 MG tablet   Other Relevant Orders   Basic metabolic panel with GFR    Return in about 3 months (around 09/19/2024).  Cleatus Debby Specking, MD

## 2024-06-19 NOTE — Assessment & Plan Note (Signed)
 Blood pressure is 132/67 mmHg. Discussed switching from chlorthalidone  to losartan  due to diabetes and CKD. Switched from chlorthalidone  to losartan  for hypertension management.  Follow-up in 4 weeks for a lab visit for a BMP recheck.

## 2024-07-17 ENCOUNTER — Other Ambulatory Visit (INDEPENDENT_AMBULATORY_CARE_PROVIDER_SITE_OTHER)

## 2024-07-17 ENCOUNTER — Ambulatory Visit: Payer: Self-pay | Admitting: Student in an Organized Health Care Education/Training Program

## 2024-07-17 DIAGNOSIS — I10 Essential (primary) hypertension: Secondary | ICD-10-CM

## 2024-07-17 LAB — BASIC METABOLIC PANEL WITH GFR
BUN: 15 mg/dL (ref 6–23)
CO2: 28 meq/L (ref 19–32)
Calcium: 9.3 mg/dL (ref 8.4–10.5)
Chloride: 105 meq/L (ref 96–112)
Creatinine, Ser: 0.94 mg/dL (ref 0.40–1.20)
GFR: 58.33 mL/min — ABNORMAL LOW (ref >60.00)
Glucose, Bld: 106 mg/dL — ABNORMAL HIGH (ref 70–99)
Potassium: 3.9 meq/L (ref 3.5–5.1)
Sodium: 140 meq/L (ref 135–145)

## 2024-07-17 NOTE — Telephone Encounter (Signed)
 Called patient to relay results, Left VM to return call and that a letter has been sent to patient to be able to view his note and the results.

## 2024-07-17 NOTE — Telephone Encounter (Signed)
 Patient returned call   Lab results have been discussed.   Verbalized understanding? Yes  Are there any questions? No

## 2024-07-17 NOTE — Telephone Encounter (Signed)
Letter sent out to patient.

## 2024-08-19 NOTE — Progress Notes (Signed)
 Pharmacy Quality Measure Review  This patient is appearing on a report for being at risk of failing the adherence measure for diabetes medications this calendar year.   Medication: pioglitazone  30 mg daily Last fill date: 08/02/2024 for 90 day supply  Insurance report was not up to date. No action needed at this time.   Woodie Jock, PharmD PGY1 Pharmacy Resident  08/19/2024

## 2024-09-25 ENCOUNTER — Ambulatory Visit: Admitting: Student in an Organized Health Care Education/Training Program

## 2024-10-11 ENCOUNTER — Telehealth: Payer: Self-pay | Admitting: Pharmacist

## 2024-10-11 NOTE — Progress Notes (Signed)
 Pharmacy Quality Measure Review  This patient is appearing on a report for being at risk of failing the adherence measure for hypertension (ACEi/ARB) medications this calendar year.   Medication: loartan Last fill date: 06/19/2024 for 90 day supply  Reviewed recent refill history in [facility]. Actual last refill date was 10/08/2024 for 90 day supply. Called [facility] and verifie patient picked up losartan  today. Patient has 1 refill remaining.    Insurance report was not up to date. No action needed at this time.   Madelin Ray, PharmD Clinical Pharmacist [facility]  Population Health [phone]

## 2024-10-23 LAB — HM MAMMOGRAPHY

## 2024-10-31 ENCOUNTER — Telehealth: Payer: Self-pay | Admitting: Pharmacist

## 2024-10-31 NOTE — Telephone Encounter (Signed)
 Pharmacy Quality Measure Review  This patient is appearing on a report for being at risk of failing the adherence measure for cholesterol (statin) and diabetes medications this calendar year.   Medication: simvastatin   Last fill date: 07/17/2024 for 90 day supply has 2 refills remaining  Medication: pioglitazone  Last fill date: 08/02/2024 for 90 day supply has 2 refills remaining.   Lab Results  Component Value Date   CHOL 191 10/30/2023   HDL 69 10/30/2023   LDLCALC 111 (H) 10/30/2023   TRIG 58 10/30/2023   CHOLHDL 2.8 10/30/2023   Lab Results  Component Value Date   HGBA1C 6.9 (A) 06/19/2024     Discussed adherence with patient. She endorses she is taking both simvastatin  and pioglitazone  every day. She states she does not need a refill at this time but she will call [facility] when she does need refills.   Madelin Ray, PharmD Clinical Pharmacist [facility]  Population Health [phone]

## 2025-05-26 ENCOUNTER — Encounter
